# Patient Record
Sex: Male | Born: 1935 | Race: Black or African American | Hispanic: No | Marital: Married | Smoking: Never smoker
Health system: Southern US, Community
[De-identification: ages and names within clinical notes are randomized; demographics above are authoritative.]

## PROBLEM LIST (undated history)

## (undated) DIAGNOSIS — I421 Obstructive hypertrophic cardiomyopathy: Secondary | ICD-10-CM

## (undated) DIAGNOSIS — R011 Cardiac murmur, unspecified: Secondary | ICD-10-CM

## (undated) DIAGNOSIS — I10 Essential (primary) hypertension: Secondary | ICD-10-CM

## (undated) DIAGNOSIS — C801 Malignant (primary) neoplasm, unspecified: Secondary | ICD-10-CM

## (undated) DIAGNOSIS — J449 Chronic obstructive pulmonary disease, unspecified: Secondary | ICD-10-CM

## (undated) DIAGNOSIS — Q396 Congenital diverticulum of esophagus: Secondary | ICD-10-CM

## (undated) DIAGNOSIS — M1990 Unspecified osteoarthritis, unspecified site: Secondary | ICD-10-CM

## (undated) HISTORY — DX: Congenital diverticulum of esophagus: Q39.6

## (undated) HISTORY — PX: ESOPHAGECTOMY: SUR457

---

## 2006-06-15 ENCOUNTER — Emergency Department (HOSPITAL_COMMUNITY): Admission: EM | Admit: 2006-06-15 | Discharge: 2006-06-15 | Payer: Self-pay | Admitting: Emergency Medicine

## 2008-01-25 ENCOUNTER — Emergency Department (HOSPITAL_COMMUNITY): Admission: EM | Admit: 2008-01-25 | Discharge: 2008-01-25 | Payer: Self-pay | Admitting: Emergency Medicine

## 2008-02-05 ENCOUNTER — Encounter: Admission: RE | Admit: 2008-02-05 | Discharge: 2008-02-05 | Payer: Self-pay | Admitting: Orthopedic Surgery

## 2008-07-13 ENCOUNTER — Encounter: Admission: RE | Admit: 2008-07-13 | Discharge: 2008-07-13 | Payer: Self-pay | Admitting: Orthopedic Surgery

## 2008-11-23 ENCOUNTER — Encounter: Admission: RE | Admit: 2008-11-23 | Discharge: 2008-11-23 | Payer: Self-pay | Admitting: Orthopedic Surgery

## 2012-01-03 ENCOUNTER — Other Ambulatory Visit: Payer: Self-pay | Admitting: Orthopedic Surgery

## 2012-01-04 ENCOUNTER — Encounter (HOSPITAL_COMMUNITY)
Admission: RE | Admit: 2012-01-04 | Discharge: 2012-01-04 | Disposition: A | Discharge status: Home / Self Care | Payer: Medicare Other | Source: Ambulatory Visit | Attending: Orthopedic Surgery | Admitting: Orthopedic Surgery

## 2012-01-04 ENCOUNTER — Other Ambulatory Visit: Payer: Self-pay

## 2012-01-04 ENCOUNTER — Encounter (HOSPITAL_COMMUNITY): Payer: Self-pay | Admitting: Vascular Surgery

## 2012-01-04 ENCOUNTER — Encounter (HOSPITAL_COMMUNITY): Payer: Self-pay | Admitting: Pharmacy Technician

## 2012-01-04 ENCOUNTER — Encounter (HOSPITAL_COMMUNITY): Payer: Self-pay

## 2012-01-04 HISTORY — DX: Malignant (primary) neoplasm, unspecified: C80.1

## 2012-01-04 LAB — CBC
MCH: 28.4 pg (ref 26.0–34.0)
MCHC: 33.4 g/dL (ref 30.0–36.0)
MCV: 84.9 fL (ref 78.0–100.0)
Platelets: 192 10*3/uL (ref 150–400)
RDW: 14.5 % (ref 11.5–15.5)
WBC: 3.3 10*3/uL — ABNORMAL LOW (ref 4.0–10.5)

## 2012-01-04 LAB — SURGICAL PCR SCREEN
MRSA, PCR: NEGATIVE
Staphylococcus aureus: NEGATIVE

## 2012-01-04 LAB — COMPREHENSIVE METABOLIC PANEL
AST: 20 U/L (ref 0–37)
Albumin: 3.7 g/dL (ref 3.5–5.2)
Calcium: 9.3 mg/dL (ref 8.4–10.5)
Creatinine, Ser: 0.98 mg/dL (ref 0.50–1.35)
Total Protein: 7.1 g/dL (ref 6.0–8.3)

## 2012-01-04 LAB — URINALYSIS, ROUTINE W REFLEX MICROSCOPIC
Glucose, UA: NEGATIVE mg/dL
Hgb urine dipstick: NEGATIVE
Leukocytes, UA: NEGATIVE
Specific Gravity, Urine: 1.023 (ref 1.005–1.030)

## 2012-01-04 MED ORDER — CHLORHEXIDINE GLUCONATE 4 % EX LIQD: 60.0000 mL | Freq: Once | CUTANEOUS | Status: DC

## 2012-01-04 MED ORDER — CEFAZOLIN SODIUM-DEXTROSE 2-3 GM-% IV SOLR
2.0000 g | INTRAVENOUS | Status: AC
  Administered 2012-01-05: 2 g via INTRAVENOUS
  Filled 2012-01-04: qty 50

## 2012-01-04 NOTE — Pre-Procedure Instructions (Signed)
20 Dan Freeman  01/04/2012   Your procedure is scheduled on:  01/05/12  Report to [facility] at 630 AM.  Call this number if you have problems the morning of surgery: [phone]   Remember:   Do not eat food:After Midnight.  May have clear liquids: up to 4 Hours before arrival.  Clear liquids include soda, tea, black coffee, apple or grape juice, broth.  Take these medicines the morning of surgery with A SIP OF WATER: none   Do not wear jewelry, make-up or nail polish.  Do not wear lotions, powders, or perfumes. You may wear deodorant.  Do not shave 48 hours prior to surgery.  Do not bring valuables to the hospital.  Contacts, dentures or bridgework may not be worn into surgery.  Leave suitcase in the car. After surgery it may be brought to your room.  For patients admitted to the hospital, checkout time is 11:00 AM the day of discharge.   Patients discharged the day of surgery will not be allowed to drive home.  Name and phone number of your driver: family  Special Instructions: CHG Shower Use Special Wash: 1/2 bottle night before surgery and 1/2 bottle morning of surgery.   Please read over the following fact sheets that you were given: Pain Booklet, Coughing and Deep Breathing, MRSA Information and Surgical Site Infection Prevention

## 2012-01-04 NOTE — Consult Note (Signed)
Anesthesia:  Patient is a 78 year old male scheduled for a arthroscopic acromioplasty and possible labral and cuff repair, right shoulder on 01/05/12. His PAT appointment was earlier today, 01/04/12, and I was given his chart to review after 1615.  (No prior records were requested prior to giving me the chart to review.)    His history includes prostate cancer s/p radiation and esophageal diverticulum s/p esophageal resection at [facility]  He denies known history of DM, CAD, CHF, or MI.  His PCP is Dr. Fredia Beets at [facility] [phone]).  His meds include ASA, Ventolin PRN, Ginseng and Ginkgo Biloba.  He denies CP, SOB, edema.  He is a Optician, dispensing, and says he stays fairly active with his work and family which includes 25 grandchildren.  He said he can go up a flight of stairs at a fairly fast pace, can push a lawnmower, etc.    His EKG from today (which is not visible in Epic as of yet) shows NSR, LAD, right BBB, LVH with repolarization abnormality.  (He has anterolateral T wave inversion which may be due to LVH with strain)  His PCP office does not have any EKGs of file.  I have sent a fax to [facility] requesting an old EKG if available.  (Patient is unsure if one has ever been done before.  He denied any prior stress or echo.)  CXR today showed: Mild hyperinflation. No acute infiltrate or pleural effusion. No  pulmonary edema. Stable probable calcified granuloma in the left upper lobe measures 8.7 mm. Probable old fracture deformity of left eighth rib. Question prior thoracotomy or prior trauma with widening of the space between left seventh and eighth rib.   Labs acceptable.  I reviewed his EKG and history with Anesthesiologist Dr. Randa Evens.  Hopefully, we will get a comparison EKG from [facility] before his surgery tomorrow.  Either way, he will be evaluated by his assigned Anesthesiologist tomorrow morning, and a definitive Anesthesia plan will be determined at that time.

## 2012-01-04 NOTE — Pre-Procedure Instructions (Signed)
20 Dan Freeman  01/04/2012   Your procedure is scheduled on:  01/05/12  Report to [facility] at 630 AM.  Call this number if you have problems the morning of surgery: [phone]   Remember:   Do not eat food:After Midnight.  May have clear liquids: up to 4 Hours before arrival.  Clear liquids include soda, tea, black coffee, apple or grape juice, broth.  Take these medicines the morning of surgery with A SIP OF WATER: none   Do not wear jewelry, make-up or nail polish.  Do not wear lotions, powders, or perfumes. You may wear deodorant.  Do not shave 48 hours prior to surgery.  Do not bring valuables to the hospital.  Contacts, dentures or bridgework may not be worn into surgery.  Leave suitcase in the car. After surgery it may be brought to your room.  For patients admitted to the hospital, checkout time is 11:00 AM the day of discharge.   Patients discharged the day of surgery will not be allowed to drive home.  Name and phone number of your driver: wife  Special Instructions: CHG Shower Use Special Wash: 1/2 bottle night before surgery and 1/2 bottle morning of surgery.   Please read over the following fact sheets that you were given: Pain Booklet, Coughing and Deep Breathing, Total Joint Packet, MRSA Information and Surgical Site Infection Prevention

## 2012-01-04 NOTE — Progress Notes (Signed)
Completed not in med rec. 

## 2012-01-05 ENCOUNTER — Ambulatory Visit (HOSPITAL_COMMUNITY): Payer: Medicare Other | Admitting: Vascular Surgery

## 2012-01-05 ENCOUNTER — Encounter (HOSPITAL_COMMUNITY): Payer: Self-pay | Admitting: Vascular Surgery

## 2012-01-05 ENCOUNTER — Ambulatory Visit (HOSPITAL_COMMUNITY)
Admission: RE | Admit: 2012-01-05 | Discharge: 2012-01-06 | Disposition: A | Discharge status: Home / Self Care | Payer: Medicare Other | Source: Ambulatory Visit | Attending: Orthopedic Surgery | Admitting: Orthopedic Surgery

## 2012-01-05 ENCOUNTER — Encounter (HOSPITAL_COMMUNITY)
Admission: RE | Disposition: A | Discharge status: Home / Self Care | Payer: Self-pay | Source: Ambulatory Visit | Attending: Orthopedic Surgery

## 2012-01-05 DIAGNOSIS — M659 Unspecified synovitis and tenosynovitis, unspecified site: Secondary | ICD-10-CM | POA: Insufficient documentation

## 2012-01-05 DIAGNOSIS — H9190 Unspecified hearing loss, unspecified ear: Secondary | ICD-10-CM | POA: Insufficient documentation

## 2012-01-05 DIAGNOSIS — Z01810 Encounter for preprocedural cardiovascular examination: Secondary | ICD-10-CM | POA: Insufficient documentation

## 2012-01-05 DIAGNOSIS — M67919 Unspecified disorder of synovium and tendon, unspecified shoulder: Secondary | ICD-10-CM | POA: Insufficient documentation

## 2012-01-05 DIAGNOSIS — Z01818 Encounter for other preprocedural examination: Secondary | ICD-10-CM | POA: Insufficient documentation

## 2012-01-05 DIAGNOSIS — Z01812 Encounter for preprocedural laboratory examination: Secondary | ICD-10-CM | POA: Insufficient documentation

## 2012-01-05 DIAGNOSIS — M25819 Other specified joint disorders, unspecified shoulder: Secondary | ICD-10-CM | POA: Insufficient documentation

## 2012-01-05 DIAGNOSIS — M719 Bursopathy, unspecified: Secondary | ICD-10-CM | POA: Insufficient documentation

## 2012-01-05 DIAGNOSIS — M75101 Unspecified rotator cuff tear or rupture of right shoulder, not specified as traumatic: Secondary | ICD-10-CM

## 2012-01-05 HISTORY — PX: SHOULDER ARTHROSCOPY: SHX128

## 2012-01-05 SURGERY — ARTHROSCOPY, SHOULDER
Anesthesia: General | Site: Shoulder | Laterality: Right

## 2012-01-05 MED ORDER — ONDANSETRON HCL 4 MG/2ML IJ SOLN
INTRAMUSCULAR | Status: DC | PRN
  Administered 2012-01-05: 4 mg via INTRAVENOUS

## 2012-01-05 MED ORDER — PHENYLEPHRINE HCL 10 MG/ML IJ SOLN
INTRAMUSCULAR | Status: DC | PRN
  Administered 2012-01-05: 100 ug via INTRAVENOUS
  Administered 2012-01-05 (×2): 50 ug via INTRAVENOUS

## 2012-01-05 MED ORDER — ACETAMINOPHEN 325 MG PO TABS
650.0000 mg | ORAL_TABLET | Freq: Four times a day (QID) | ORAL | Status: DC | PRN
  Administered 2012-01-06: 650 mg via ORAL
  Filled 2012-01-05: qty 2

## 2012-01-05 MED ORDER — METOCLOPRAMIDE HCL 10 MG PO TABS: 5.0000 mg | ORAL_TABLET | Freq: Three times a day (TID) | ORAL | Status: DC | PRN

## 2012-01-05 MED ORDER — EPINEPHRINE HCL 1 MG/ML IJ SOLN
INTRAMUSCULAR | Status: DC | PRN
  Administered 2012-01-05: 2 mg

## 2012-01-05 MED ORDER — METOCLOPRAMIDE HCL 5 MG/ML IJ SOLN: 5.0000 mg | Freq: Three times a day (TID) | INTRAMUSCULAR | Status: DC | PRN

## 2012-01-05 MED ORDER — PROPOFOL 10 MG/ML IV EMUL
INTRAVENOUS | Status: DC | PRN
  Administered 2012-01-05: 200 mg via INTRAVENOUS

## 2012-01-05 MED ORDER — LACTATED RINGERS IV SOLN
INTRAVENOUS | Status: DC
  Administered 2012-01-05 (×2): via INTRAVENOUS

## 2012-01-05 MED ORDER — BUPIVACAINE-EPINEPHRINE PF 0.5-1:200000 % IJ SOLN
INTRAMUSCULAR | Status: DC | PRN
  Administered 2012-01-05: 150 mg

## 2012-01-05 MED ORDER — FENTANYL CITRATE 0.05 MG/ML IJ SOLN
INTRAMUSCULAR | Status: AC
  Filled 2012-01-05: qty 2

## 2012-01-05 MED ORDER — ASPIRIN EC 81 MG PO TBEC
81.0000 mg | DELAYED_RELEASE_TABLET | Freq: Every day | ORAL | Status: DC
  Administered 2012-01-05 – 2012-01-06 (×2): 81 mg via ORAL
  Filled 2012-01-05 (×2): qty 1

## 2012-01-05 MED ORDER — FENTANYL CITRATE 0.05 MG/ML IJ SOLN
100.0000 ug | INTRAMUSCULAR | Status: DC | PRN
  Administered 2012-01-05: 100 ug via INTRAVENOUS

## 2012-01-05 MED ORDER — DEXTROSE-NACL 5-0.45 % IV SOLN
INTRAVENOUS | Status: DC
  Administered 2012-01-05: 19:00:00 via INTRAVENOUS

## 2012-01-05 MED ORDER — FENTANYL CITRATE 0.05 MG/ML IJ SOLN
INTRAMUSCULAR | Status: DC | PRN
  Administered 2012-01-05: 50 ug via INTRAVENOUS

## 2012-01-05 MED ORDER — ONDANSETRON HCL 4 MG/2ML IJ SOLN: 4.0000 mg | Freq: Four times a day (QID) | INTRAMUSCULAR | Status: DC | PRN

## 2012-01-05 MED ORDER — ONDANSETRON HCL 4 MG PO TABS: 4.0000 mg | ORAL_TABLET | Freq: Four times a day (QID) | ORAL | Status: DC | PRN

## 2012-01-05 MED ORDER — ACETAMINOPHEN 650 MG RE SUPP: 650.0000 mg | Freq: Four times a day (QID) | RECTAL | Status: DC | PRN

## 2012-01-05 MED ORDER — NEOSTIGMINE METHYLSULFATE 1 MG/ML IJ SOLN
INTRAMUSCULAR | Status: DC | PRN
  Administered 2012-01-05: 3 mg via INTRAVENOUS

## 2012-01-05 MED ORDER — SODIUM CHLORIDE 0.9 % IR SOLN
Status: DC | PRN
  Administered 2012-01-05: 12000 mL

## 2012-01-05 MED ORDER — HYDROMORPHONE HCL PF 1 MG/ML IJ SOLN: 0.2500 mg | INTRAMUSCULAR | Status: DC | PRN

## 2012-01-05 MED ORDER — MIDAZOLAM HCL 2 MG/2ML IJ SOLN
INTRAMUSCULAR | Status: AC
  Filled 2012-01-05: qty 2

## 2012-01-05 MED ORDER — MENTHOL 3 MG MT LOZG: 1.0000 | LOZENGE | OROMUCOSAL | Status: DC | PRN

## 2012-01-05 MED ORDER — CEFAZOLIN SODIUM 1-5 GM-% IV SOLN
1.0000 g | Freq: Four times a day (QID) | INTRAVENOUS | Status: AC
  Administered 2012-01-05 – 2012-01-06 (×3): 1 g via INTRAVENOUS
  Filled 2012-01-05 (×4): qty 50

## 2012-01-05 MED ORDER — OXYCODONE-ACETAMINOPHEN 5-325 MG PO TABS
1.0000 | ORAL_TABLET | ORAL | Status: DC | PRN
  Administered 2012-01-06: 1 via ORAL
  Filled 2012-01-05: qty 1

## 2012-01-05 MED ORDER — DROPERIDOL 2.5 MG/ML IJ SOLN: 0.6250 mg | INTRAMUSCULAR | Status: DC | PRN

## 2012-01-05 MED ORDER — HYDROMORPHONE HCL PF 1 MG/ML IJ SOLN
0.5000 mg | INTRAMUSCULAR | Status: DC | PRN
  Administered 2012-01-06: 1 mg via INTRAVENOUS
  Filled 2012-01-05: qty 1

## 2012-01-05 MED ORDER — PHENYLEPHRINE HCL 10 MG/ML IJ SOLN
20.0000 mg | INTRAVENOUS | Status: DC | PRN
  Administered 2012-01-05: 20 ug/min via INTRAVENOUS

## 2012-01-05 MED ORDER — ALBUTEROL SULFATE HFA 108 (90 BASE) MCG/ACT IN AERS
2.0000 | INHALATION_SPRAY | Freq: Four times a day (QID) | RESPIRATORY_TRACT | Status: DC | PRN
  Administered 2012-01-05: 2 via RESPIRATORY_TRACT
  Filled 2012-01-05: qty 6.7

## 2012-01-05 MED ORDER — GLYCOPYRROLATE 0.2 MG/ML IJ SOLN
INTRAMUSCULAR | Status: DC | PRN
  Administered 2012-01-05: .5 mg via INTRAVENOUS

## 2012-01-05 MED ORDER — MIDAZOLAM HCL 2 MG/2ML IJ SOLN
2.0000 mg | INTRAMUSCULAR | Status: DC | PRN
  Administered 2012-01-05: 2 mg via INTRAVENOUS

## 2012-01-05 MED ORDER — PHENOL 1.4 % MT LIQD: 1.0000 | OROMUCOSAL | Status: DC | PRN

## 2012-01-05 MED ORDER — ROCURONIUM BROMIDE 100 MG/10ML IV SOLN
INTRAVENOUS | Status: DC | PRN
  Administered 2012-01-05: 50 mg via INTRAVENOUS

## 2012-01-05 SURGICAL SUPPLY — 40 items
BLADE CUTTER GATOR 3.5 (BLADE) ×3 IMPLANT
BLADE GREAT WHITE 4.2 (BLADE) ×3 IMPLANT
BLADE SURG 11 STRL SS (BLADE) ×3 IMPLANT
BUR OVAL 4.0 (BURR) ×3 IMPLANT
CLOTH BEACON ORANGE TIMEOUT ST (SAFETY) ×3 IMPLANT
COVER SURGICAL LIGHT HANDLE (MISCELLANEOUS) ×3 IMPLANT
DRAPE STERI 35X30 U-POUCH (DRAPES) ×3 IMPLANT
DRAPE U-SHAPE 47X51 STRL (DRAPES) ×3 IMPLANT
DRSG EMULSION OIL 3X3 NADH (GAUZE/BANDAGES/DRESSINGS) ×3 IMPLANT
DRSG PAD ABDOMINAL 8X10 ST (GAUZE/BANDAGES/DRESSINGS) ×3 IMPLANT
DURAPREP 26ML APPLICATOR (WOUND CARE) ×3 IMPLANT
ELECT MENISCUS 165MM 90D (ELECTRODE) IMPLANT
FILTER STRAW FLUID ASPIR (MISCELLANEOUS) ×3 IMPLANT
GLOVE SS PI 9.0 STRL (GLOVE) ×3 IMPLANT
GOWN PREVENTION PLUS XLARGE (GOWN DISPOSABLE) ×3 IMPLANT
GOWN STRL NON-REIN LRG LVL3 (GOWN DISPOSABLE) ×6 IMPLANT
KIT BASIN OR (CUSTOM PROCEDURE TRAY) ×3 IMPLANT
KIT ROOM TURNOVER OR (KITS) ×3 IMPLANT
MANIFOLD NEPTUNE II (INSTRUMENTS) ×3 IMPLANT
NEEDLE HYPO 21X1.5 SAFETY (NEEDLE) ×3 IMPLANT
NEEDLE HYPO 25GX1X1/2 BEV (NEEDLE) ×3 IMPLANT
NS IRRIG 1000ML POUR BTL (IV SOLUTION) ×3 IMPLANT
PACK SHOULDER (CUSTOM PROCEDURE TRAY) ×3 IMPLANT
PAD ARMBOARD 7.5X6 YLW CONV (MISCELLANEOUS) ×6 IMPLANT
SET ARTHROSCOPY TUBING (MISCELLANEOUS) ×1
SET ARTHROSCOPY TUBING LN (MISCELLANEOUS) ×2 IMPLANT
SLING ARM FOAM STRAP LRG (SOFTGOODS) ×3 IMPLANT
SPONGE GAUZE 4X4 12PLY (GAUZE/BANDAGES/DRESSINGS) ×3 IMPLANT
SPONGE LAP 4X18 X RAY DECT (DISPOSABLE) ×3 IMPLANT
SUT ETHIBOND 2 OS 4 DA (SUTURE) ×3 IMPLANT
SUT ETHILON 4 0 PS 2 18 (SUTURE) ×3 IMPLANT
SUT PROLENE 3 0 PS 2 (SUTURE) IMPLANT
SUT VIC AB 0 CTB1 27 (SUTURE) IMPLANT
SUT VIC AB 2-0 FS1 27 (SUTURE) IMPLANT
SYR 3ML LL SCALE MARK (SYRINGE) ×6 IMPLANT
SYR CONTROL 10ML LL (SYRINGE) ×3 IMPLANT
TAPE CLOTH SURG 6X10 WHT LF (GAUZE/BANDAGES/DRESSINGS) ×3 IMPLANT
TOWEL OR 17X24 6PK STRL BLUE (TOWEL DISPOSABLE) ×3 IMPLANT
TOWEL OR 17X26 10 PK STRL BLUE (TOWEL DISPOSABLE) ×3 IMPLANT
WATER STERILE IRR 1000ML POUR (IV SOLUTION) ×3 IMPLANT

## 2012-01-05 NOTE — Preoperative (Signed)
Beta Blockers   Reason not to administer Beta Blockers:Not Applicable 

## 2012-01-05 NOTE — Op Note (Signed)
NAMEPERLE, GIBBON NO.:  [id number]  MEDICAL RECORD NO.:  [id number]  LOCATION:  [facility]                         FACILITY:  [facility]  PHYSICIAN:  Myrtie Neither, MD      DATE OF BIRTH:  1930-12-25  DATE OF PROCEDURE:  01/05/2012 DATE OF DISCHARGE:                              OPERATIVE REPORT   PREOPERATIVE DIAGNOSES:  Impingement syndrome, right shoulder; chronic rotator cuff tear, right shoulder; labral tear, right shoulder; and synovitis, right shoulder.  POSTOPERATIVE DIAGNOSES:  Impingement syndrome, right shoulder; chronic rotator cuff tear, right shoulder; labral tear, right shoulder; and synovitis, right shoulder.  OPERATION:  Arthroscopic synovectomy, arthroscopic acromioplasty, and debridement of labral tear.  ANESTHESIA:  General.  PROCEDURE TECHNIQUE:  The patient was taken to the operating room. After given adequate preop medications, given general anesthesia, and intubated, right shoulder was prepped with DuraPrep and draped in sterile manner.  The patient was placed in a barber chair position. Incision was made posteriorly.  Swisher rod was placed from posterior to anterior.  Inflow broad incision was then made anteriorly.  Separate lateral incision was made for the shaver.  Inspection revealed chronic rotator cuff tear non-repairable with tremendous hypertrophic overgrowth of the synovium, osteophyte anteriorly and laterally above the acromion. Inspection of the glenoid revealed degenerative tear above the superior and anterior lip of the labrum, but was still well adherent to the glenoid.  Complete synovectomy was done followed by acromioplasty and debridement of the labrum was done.  Separate osteophyte was identified and resected.  Humeral head showed degenerative changes, but was well preserved.  The glenoid itself was demonstrated the degenerative changes as well.  After adequate acromioplasty and synovectomy, wound closure was then done  with 4-0 nylon.  The patient had previous block, so local was not necessary.  Immobilizing sling was applied.  The patient tolerated the procedure quite well and went to recovery room in stable and satisfactory condition.     Myrtie Neither, MD     AC/MEDQ  D:  01/05/2012  T:  01/05/2012  Job:  [id number]

## 2012-01-05 NOTE — H&P (Signed)
Dan Freeman, EVITTS NO.:  [id number]  MEDICAL RECORD NO.:  [id number]  LOCATION:  [facility]                         FACILITY:  [facility]  PHYSICIAN:  Myrtie Neither, MD      DATE OF BIRTH:  [birth year]  DATE OF ADMISSION:  01/05/2012 DATE OF DISCHARGE:                             HISTORY & PHYSICAL   CHIEF COMPLAINT:  Painful, weak in his right shoulder.  HISTORY OF PRESENT ILLNESS:  This is a 78 year old black male who has been followed in the office for chronic rotator cuff tear and impingement syndrome of the right shoulder, which had been treated with anti-inflammatories and shoulder exercises.  The patient had noted progressive worsening with difficulty reaching at and above chest level, catching and feeling locking of the right shoulder over the past few months.  The patient states the conditions are getting progressively worse and more disabling.  PAST MEDICAL HISTORY:  Prostate cancer treatment and esophageal dilatation for the stricture.  No history of high blood pressure or diabetes mellitus.  ALLERGIES:  None known.  MEDICATIONS:  Lodine 400 XL b.i.d.  REVIEW OF SYSTEMS:  No cardiac, respiratory.  No urinary or bowel symptoms.  Basically that in the history of present illness.  Some episodic low back and joint pain.  FAMILY HISTORY:  Noncontributory.  No history of high blood pressure, diabetes.  SOCIAL HISTORY:  The patient denies use of alcohol, tobacco, or illegal drugs.  PHYSICAL EXAMINATION:  VITAL SIGNS:  Temperature 98.2, pulse 72, respirations 18, blood pressure 137/86, O2 saturation 97%, height 5 feet 8 inches, weight 164 pounds. HEENT:  Head normocephalic.  Eyes:  Conjunctivae and sclerae are clear. NECK:  Supple. CHEST:  Clear. CARDIAC:  S1, S2 regular. EXTREMITIES:  Right shoulder tender anterolaterally with subacromial crepitus.  The patient has both pain as well as weakness on active abduction to chest level.  The patient  experiences catching weakness in the right shoulder between 70 and 90 degree position.  Pain on external rotation with palpable and audible click.  Good grip and pinch, intrinsics intact.  IMAGING:  X-ray revealed decreased loss of subacromial space.  Some degenerative joint changes.  Osteophytes about the acromion.  IMPRESSION: 1. Impingement syndrome, right shoulder. 2. Chronic rotator cuff tear, right shoulder. 3. Labral tear, right shoulder.  PLAN:  Arthroscopic acromioplasty and synovectomy.  Possible rotator cough and labral tear.     Myrtie Neither, MD     AC/MEDQ  D:  01/05/2012  T:  01/05/2012  Job:  [id number]

## 2012-01-05 NOTE — H&P (Signed)
Dan Freeman is an 78 y.o. male.   Chief Complaint:RIGHT SHOULDER PAIN AND WEAKNESS HPI: RIGHT SHOULDER PAIN AND WEAKNESS GETTING PROGRESSIVELY GETTING WORSE OVER THE FEW MONTHS.DIFFICULTY REACHING AND LIFTING ABOVE CHEST LEVEL.  Past Medical History  Diagnosis Date  . Cancer      prostrate     radiation  . Esophageal diverticulum     s/p esophageal resection    Past Surgical History  Procedure Date  . Esophagectomy     No family history on file. Social History:  reports that he has never smoked. He does not have any smokeless tobacco history on file. He reports that he does not drink alcohol or use illicit drugs.  Allergies: No Known Allergies  Medications Prior to Admission  Medication Dose Route Frequency Provider Last Rate Last Dose  . ceFAZolin (ANCEF) IVPB 2 g/50 mL premix  2 g Intravenous 60 min Pre-Op Kennieth Rad, MD       No current outpatient prescriptions on file as of 01/05/2012.    Results for orders placed during the hospital encounter of 01/04/12 (from the past 48 hour(s))  URINALYSIS, ROUTINE W REFLEX MICROSCOPIC     Status: Normal   Collection Time   01/04/12  9:39 AM      Component Value Range Comment   Color, Urine YELLOW  YELLOW     APPearance CLEAR  CLEAR     Specific Gravity, Urine 1.023  1.005 - 1.030     pH 5.5  5.0 - 8.0     Glucose, UA NEGATIVE  NEGATIVE (mg/dL)    Hgb urine dipstick NEGATIVE  NEGATIVE     Bilirubin Urine NEGATIVE  NEGATIVE     Ketones, ur NEGATIVE  NEGATIVE (mg/dL)    Protein, ur NEGATIVE  NEGATIVE (mg/dL)    Urobilinogen, UA 0.2  0.0 - 1.0 (mg/dL)    Nitrite NEGATIVE  NEGATIVE     Leukocytes, UA NEGATIVE  NEGATIVE  MICROSCOPIC NOT DONE ON URINES WITH NEGATIVE PROTEIN, BLOOD, LEUKOCYTES, NITRITE, OR GLUCOSE <1000 mg/dL.  SURGICAL PCR SCREEN     Status: Normal   Collection Time   01/04/12  9:39 AM      Component Value Range Comment   MRSA, PCR NEGATIVE  NEGATIVE     Staphylococcus aureus NEGATIVE  NEGATIVE    CBC      Status: Abnormal   Collection Time   01/04/12  9:40 AM      Component Value Range Comment   WBC 3.3 (*) 4.0 - 10.5 (K/uL)    RBC 4.65  4.22 - 5.81 (MIL/uL)    Hemoglobin 13.2  13.0 - 17.0 (g/dL)    HCT 65.7  84.6 - 96.2 (%)    MCV 84.9  78.0 - 100.0 (fL)    MCH 28.4  26.0 - 34.0 (pg)    MCHC 33.4  30.0 - 36.0 (g/dL)    RDW 95.2  84.1 - 32.4 (%)    Platelets 192  150 - 400 (K/uL)   COMPREHENSIVE METABOLIC PANEL     Status: Abnormal   Collection Time   01/04/12  9:40 AM      Component Value Range Comment   Sodium 142  135 - 145 (mEq/L)    Potassium 4.6  3.5 - 5.1 (mEq/L)    Chloride 109  96 - 112 (mEq/L)    CO2 27  19 - 32 (mEq/L)    Glucose, Bld 104 (*) 70 - 99 (mg/dL)    BUN 19  6 - 23 (mg/dL)    Creatinine, Ser 1.61  0.50 - 1.35 (mg/dL)    Calcium 9.3  8.4 - 10.5 (mg/dL)    Total Protein 7.1  6.0 - 8.3 (g/dL)    Albumin 3.7  3.5 - 5.2 (g/dL)    AST 20  0 - 37 (U/L)    ALT 17  0 - 53 (U/L)    Alkaline Phosphatase 65  39 - 117 (U/L)    Total Bilirubin 0.4  0.3 - 1.2 (mg/dL)    GFR calc non Af Amer 77 (*) >90 (mL/min)    GFR calc Af Amer 89 (*) >90 (mL/min)    Dg Chest 2 View  01/04/2012  *RADIOLOGY REPORT*  Clinical Data: Preop  CHEST - 2 VIEW  Comparison: Left shoulder 02/05/2008  Findings: Cardiomediastinal silhouette is unremarkable.  Mild hyperinflation.  No acute infiltrate or pleural effusion.  No pulmonary edema. Stable probable calcified granuloma in the left upper lobe measures 8.7 mm.  Probable old fracture deformity of left eighth rib.  Question prior thoracotomy or prior trauma with widening of the space between left seventh and eighth rib.  IMPRESSION: Mild hyperinflation.  No acute infiltrate or pleural effusion.  No pulmonary edema. Stable probable calcified granuloma in the left upper lobe measures 8.7 mm.  Probable old fracture deformity of left eighth rib.  Question prior thoracotomy or prior trauma with widening of the space between left seventh and eighth rib.   Original Report Authenticated By: Natasha Mead, M.D.    Review of Systems  Constitutional: Negative.  Negative for fever, chills and weight loss.  HENT: Positive for hearing loss. Negative for ear pain, congestion, sore throat and tinnitus.   Respiratory: Negative for cough, shortness of breath and wheezing.   Cardiovascular: Negative for chest pain, palpitations and leg swelling.  Gastrointestinal: Negative for abdominal pain, constipation and blood in stool.  Genitourinary: Negative for dysuria, urgency, frequency and hematuria.  Musculoskeletal: Positive for back pain and joint pain. Negative for falls.  Skin: Negative for itching and rash.  Neurological: Negative.  Negative for headaches.  Endo/Heme/Allergies: Negative.   Psychiatric/Behavioral: Negative.     Blood pressure 137/86, pulse 72, temperature 98.2 F (36.8 C), temperature source Oral, resp. rate 18, SpO2 97.00%. Physical Exam   Assessment/Plan IMPINGEMENT SYNDROME,  CHRONIC ROTATOR CUFF TEAR, AND LABRIAL TEAR RIGHT SHOULDER /PLAN :ARTHROSCOPIC ACROMIOPLASTY, SYNOVECTOMY, POSSIBLE CUFF AND LABRIAL REPAIR RIGHT SHOULDER.  Kennieth Rad 01/05/2012, 8:04 AM

## 2012-01-05 NOTE — Transfer of Care (Signed)
Immediate Anesthesia Transfer of Care Note  Patient: Dan Freeman  Procedure(s) Performed: Procedure(s) (LRB): ARTHROSCOPY SHOULDER (Right)  Patient Location: PACU  Anesthesia Type: GA combined with regional for post-op pain  Level of Consciousness: awake  Airway & Oxygen Therapy: Patient Spontanous Breathing and Patient connected to nasal cannula oxygen  Post-op Assessment: Report given to PACU RN, Post -op Vital signs reviewed and stable and Patient moving all extremities  Post vital signs: Reviewed and stable  Complications: No apparent anesthesia complications

## 2012-01-05 NOTE — Anesthesia Procedure Notes (Signed)
Anesthesia Regional Block:  Interscalene brachial plexus block  Pre-Anesthetic Checklist: ,, timeout performed, Correct Patient, Correct Site, Correct Laterality, Correct Procedure,, site marked, risks and benefits discussed, Surgical consent,  Pre-op evaluation,  At surgeon's request and post-op pain management  Laterality: Right  Prep: chloraprep       Needles:  Injection technique: Single-shot  Needle Type: Echogenic Stimulator Needle     Needle Length: 5cm 5 cm Needle Gauge: 22 and 22 G    Additional Needles:  Procedures: ultrasound guided and nerve stimulator Interscalene brachial plexus block  Nerve Stimulator or Paresthesia:  Response: bicep contraction, 0.45 mA,   Additional Responses:   Narrative:  Start time: 01/05/2012 8:21 AM End time: 01/05/2012 8:32 AM Injection made incrementally with aspirations every 5 mL.  Performed by: Personally  Anesthesiologist: J. Adonis Huguenin, MD  Additional Notes: Functioning IV was confirmed and monitors applied.  A 50mm 22ga echogenic arrow stimulator was used. Sterile prep and drape,hand hygiene and sterile gloves were used.Ultrasound guidance: relevent anatomy identified, needle position confirmed, local anesthetic spread visualized around nerve(s)., vascular puncture avoided.  Image printed for medical record.  Negative aspiration and negative test dose prior to incremental administration of local anesthetic. The patient tolerated the procedure well.  Interscalene brachial plexus block

## 2012-01-05 NOTE — Anesthesia Postprocedure Evaluation (Signed)
Anesthesia Post Note  Patient: Dan Freeman  Procedure(s) Performed: Procedure(s) (LRB): ARTHROSCOPY SHOULDER (Right)  Anesthesia type: general  Patient location: PACU  Post pain: Pain level controlled  Post assessment: Patient's Cardiovascular Status Stable  Last Vitals:  Filed Vitals:   01/05/12 1246  BP:   Pulse:   Temp: 36.1 C  Resp:     Post vital signs: Reviewed and stable  Level of consciousness: sedated  Complications: No apparent anesthesia complications

## 2012-01-05 NOTE — Anesthesia Preprocedure Evaluation (Signed)
Anesthesia Evaluation  Patient identified by MRN, date of birth, ID band Patient awake    Reviewed: Allergy & Precautions, H&P , NPO status , Patient's Chart, lab work & pertinent test results  History of Anesthesia Complications Negative for: history of anesthetic complications  Airway Mallampati: I TM Distance: >3 FB     Dental  (+) Partial Upper, Partial Lower and Dental Advisory Given   Pulmonary COPD COPD inhaler, former smoker breath sounds clear to auscultation  Pulmonary exam normal       Cardiovascular negative cardio ROS  Rhythm:Regular Rate:Normal     Neuro/Psych negative neurological ROS     GI/Hepatic negative GI ROS, Neg liver ROS,   Endo/Other  negative endocrine ROS  Renal/GU negative Renal ROS     Musculoskeletal   Abdominal   Peds  Hematology   Anesthesia Other Findings   Reproductive/Obstetrics                           Anesthesia Physical Anesthesia Plan  ASA: III  Anesthesia Plan: General   Post-op Pain Management:    Induction: Intravenous  Airway Management Planned: Oral ETT  Additional Equipment:   Intra-op Plan:   Post-operative Plan:   Informed Consent: I have reviewed the patients History and Physical, chart, labs and discussed the procedure including the risks, benefits and alternatives for the proposed anesthesia with the patient or authorized representative who has indicated his/her understanding and acceptance.   Dental advisory given  Plan Discussed with: CRNA, Anesthesiologist and Surgeon  Anesthesia Plan Comments:         Anesthesia [place] Evaluation

## 2012-01-05 NOTE — Brief Op Note (Signed)
01/05/2012  10:17 AM  PATIENT:  Dan Freeman  78 y.o. male  PRE-OPERATIVE DIAGNOSIS:  Impingement Syndrome Shoulder Cuff Tear, Labrial Tear Right Side  POST-OPERATIVE DIAGNOSIS:  Impingement Syndrome Shoulder Cuff Tear, Labrial Tear Right Side  PROCEDURE:  Procedure(s) (LRB): ARTHROSCOPY SHOULDER (Right) ARTHROSCOPIC LABRAL REPAIR (Right)  SURGEON:  Surgeon(s) and Role:    * Kennieth Rad, MD - Primary  PHYSICIAN ASSISTANT:   ASSISTANTS: none   ANESTHESIA:   general  EBL:     BLOOD ADMINISTERED:none  DRAINS: none   LOCAL MEDICATIONS USED:  NONE  SPECIMEN:  No Specimen  DISPOSITION OF SPECIMEN:  N/A  COUNTS:  YES  TOURNIQUET:  * No tourniquets in log *  DICTATION: .Other Dictation: Dictation Number report #[id number]  PLAN OF CARE: Admit for overnight observation  PATIENT DISPOSITION:  PACU - hemodynamically stable.   Delay start of Pharmacological VTE agent (>24hrs) due to surgical blood loss or risk of bleeding: not applicable

## 2012-01-05 NOTE — Progress Notes (Signed)
Foley catheter discontinued at 1725 and patient had an large amount of incontinent urine episode at 1830, bladder scanned for 69ml.  Will continue to monitor.

## 2012-01-05 NOTE — Progress Notes (Signed)
Pt transferred to 5008 from PACU via bed. Pt A&O. Family at bedside. Surgical dressing to R shoulder clean, dry, and intact. Sling to R arm on. 2L O2 via [place]. 18G PIV L hand with D5 1/2 @75 . Assessment completed and placed call bell in reach. Pt denied pain or concerns. Oriented pt to room.

## 2012-01-06 MED ORDER — OXYCODONE-ACETAMINOPHEN 5-325 MG PO TABS: 1.0000 | ORAL_TABLET | ORAL | Status: AC | PRN

## 2012-01-06 MED ORDER — ETODOLAC ER 600 MG PO TB24: 600.0000 mg | ORAL_TABLET | Freq: Every day | ORAL | Status: AC

## 2012-01-06 NOTE — Progress Notes (Signed)
Occupational Therapy Note  Shoulder evaluation completed and filed in shadow chart.  All education completed.  Pt will have necessary level of assist upon d/c home. No DME needs.  Progress rehab of shoulder as ordered by MD post follow-up appointment.  No further acute OT needed. Signing off. Thanks!  01/06/2012 Cipriano Mile OTR/L Pager [phone] Office [phone]

## 2012-01-06 NOTE — Discharge Summary (Signed)
NAMEDARRIEN, BELTER NO.:  [id number]  MEDICAL RECORD NO.:  [id number]  LOCATION:  5008                         FACILITY:  [facility]  PHYSICIAN:  Myrtie Neither, MD      DATE OF BIRTH:  1931/07/27  DATE OF ADMISSION:  01/05/2012 DATE OF DISCHARGE:  01/06/2012                              DISCHARGE SUMMARY   ADMITTING DIAGNOSIS:  Chronic rotator cuff tear, impingement syndrome, labral tear, right shoulder.  DISCHARGE DIAGNOSIS:  Chronic rotator cuff tear, impingement syndrome, labral tear, right shoulder.  COMPLICATIONS:  None.  INFECTIONS:  None.  OPERATIONS:  Arthroscopic acromioplasty, synovectomy, and labral tear debridement.  PERTINENT HISTORY:  The patient has been followed in the office for chronic impingement syndrome involving the right rotator cuff with progressive weakening and difficulty lifting and reaching the head and above and the chest level over the past few months.  The patient is noted to have increased loss of function.  Pertinent physical exam of the right shoulder, tender anteriorly and laterally, subacromial crepitus, limited range of motion with pain and increased on resisted abduction and attempted active abduction at 75- degree position, palpable and audible click anteriorly about the right shoulder.  HOSPITAL COURSE:  The patient underwent preop laboratory, CBC, EKG, chest x-ray, CMET, UA.  The patient's chest x-ray, EKG, and the patient's labs were found to be stable enough to undergo surgery.  The patient was sent for arthroscopic surgery and tolerated the procedure quite well.  POSTOP CARE:  A 23-hour observation for pain control.  The patient remained stable, afebrile.  Presently, he is able to grip well. Dressing is dry.  The patient is stable enough to be discharged. Continue ice packs for another 24 hours twice a day and keep dressing on.  Return to our office next Wednesday.  Percocet 1-2 q.4 hours p.r.n. for pain, and  Lodine 600 mg 1 daily.  The patient is being discharged in stable and satisfactory condition.     Myrtie Neither, MD     AC/MEDQ  D:  01/06/2012  T:  01/06/2012  Job:  [id number]

## 2012-01-08 ENCOUNTER — Encounter (HOSPITAL_COMMUNITY): Payer: Self-pay | Admitting: Orthopedic Surgery

## 2012-01-24 ENCOUNTER — Ambulatory Visit: Payer: Medicare Other | Attending: Orthopedic Surgery

## 2012-01-24 DIAGNOSIS — IMO0001 Reserved for inherently not codable concepts without codable children: Secondary | ICD-10-CM | POA: Insufficient documentation

## 2012-01-24 DIAGNOSIS — M25519 Pain in unspecified shoulder: Secondary | ICD-10-CM | POA: Insufficient documentation

## 2012-01-24 DIAGNOSIS — M25619 Stiffness of unspecified shoulder, not elsewhere classified: Secondary | ICD-10-CM | POA: Insufficient documentation

## 2012-01-24 DIAGNOSIS — M6281 Muscle weakness (generalized): Secondary | ICD-10-CM | POA: Insufficient documentation

## 2012-01-24 DIAGNOSIS — R5381 Other malaise: Secondary | ICD-10-CM | POA: Insufficient documentation

## 2012-01-25 ENCOUNTER — Encounter (HOSPITAL_COMMUNITY): Admission: RE | Payer: Self-pay | Source: Ambulatory Visit

## 2012-01-25 SURGERY — SHOULDER ARTHROSCOPY WITH SUBACROMIAL DECOMPRESSION
Anesthesia: General | Laterality: Right

## 2012-01-26 ENCOUNTER — Ambulatory Visit (HOSPITAL_COMMUNITY): Admission: RE | Admit: 2012-01-26 | Payer: Medicare Other | Source: Ambulatory Visit | Admitting: Orthopedic Surgery

## 2012-01-29 ENCOUNTER — Ambulatory Visit: Payer: Medicare Other

## 2012-01-31 ENCOUNTER — Ambulatory Visit: Payer: Medicare Other

## 2012-02-05 ENCOUNTER — Ambulatory Visit: Payer: Medicare Other

## 2012-02-12 ENCOUNTER — Ambulatory Visit: Payer: Medicare Other

## 2012-02-14 ENCOUNTER — Ambulatory Visit: Payer: Medicare Other | Attending: Orthopedic Surgery

## 2012-02-14 DIAGNOSIS — IMO0001 Reserved for inherently not codable concepts without codable children: Secondary | ICD-10-CM | POA: Insufficient documentation

## 2012-02-14 DIAGNOSIS — M6281 Muscle weakness (generalized): Secondary | ICD-10-CM | POA: Insufficient documentation

## 2012-02-14 DIAGNOSIS — R5381 Other malaise: Secondary | ICD-10-CM | POA: Insufficient documentation

## 2012-02-14 DIAGNOSIS — M25519 Pain in unspecified shoulder: Secondary | ICD-10-CM | POA: Insufficient documentation

## 2012-02-14 DIAGNOSIS — M25619 Stiffness of unspecified shoulder, not elsewhere classified: Secondary | ICD-10-CM | POA: Insufficient documentation

## 2012-02-19 ENCOUNTER — Ambulatory Visit: Payer: Medicare Other

## 2012-02-21 ENCOUNTER — Ambulatory Visit: Payer: Medicare Other

## 2012-02-27 ENCOUNTER — Ambulatory Visit: Payer: Medicare Other | Admitting: Physical Therapy

## 2012-02-28 ENCOUNTER — Encounter: Payer: Medicare Other | Admitting: Physical Therapy

## 2012-03-06 ENCOUNTER — Ambulatory Visit: Payer: Medicare Other | Admitting: Physical Therapy

## 2012-03-08 ENCOUNTER — Ambulatory Visit: Payer: Medicare Other

## 2012-03-13 ENCOUNTER — Ambulatory Visit: Payer: Medicare Other

## 2012-03-15 ENCOUNTER — Ambulatory Visit: Payer: Medicare Other | Admitting: Physical Therapy

## 2012-03-18 ENCOUNTER — Ambulatory Visit: Payer: Medicare Other | Attending: Orthopedic Surgery | Admitting: Physical Therapy

## 2012-03-18 DIAGNOSIS — IMO0001 Reserved for inherently not codable concepts without codable children: Secondary | ICD-10-CM | POA: Insufficient documentation

## 2012-03-18 DIAGNOSIS — M25519 Pain in unspecified shoulder: Secondary | ICD-10-CM | POA: Insufficient documentation

## 2012-03-18 DIAGNOSIS — M6281 Muscle weakness (generalized): Secondary | ICD-10-CM | POA: Insufficient documentation

## 2012-03-18 DIAGNOSIS — R5381 Other malaise: Secondary | ICD-10-CM | POA: Insufficient documentation

## 2012-03-18 DIAGNOSIS — M25619 Stiffness of unspecified shoulder, not elsewhere classified: Secondary | ICD-10-CM | POA: Insufficient documentation

## 2013-05-01 ENCOUNTER — Ambulatory Visit
Admission: RE | Admit: 2013-05-01 | Discharge: 2013-05-01 | Disposition: A | Discharge status: Home / Self Care | Payer: Medicare Other | Source: Ambulatory Visit | Attending: Orthopedic Surgery | Admitting: Orthopedic Surgery

## 2013-05-01 ENCOUNTER — Other Ambulatory Visit: Payer: Self-pay | Admitting: Orthopedic Surgery

## 2013-05-01 DIAGNOSIS — M5412 Radiculopathy, cervical region: Secondary | ICD-10-CM

## 2013-05-14 ENCOUNTER — Other Ambulatory Visit: Payer: Self-pay | Admitting: Orthopedic Surgery

## 2013-05-14 DIAGNOSIS — M5412 Radiculopathy, cervical region: Secondary | ICD-10-CM

## 2013-05-19 ENCOUNTER — Other Ambulatory Visit: Payer: Self-pay | Admitting: Orthopedic Surgery

## 2013-05-19 DIAGNOSIS — Z139 Encounter for screening, unspecified: Secondary | ICD-10-CM

## 2013-05-21 ENCOUNTER — Ambulatory Visit
Admission: RE | Admit: 2013-05-21 | Discharge: 2013-05-21 | Disposition: A | Discharge status: Home / Self Care | Payer: Medicare Other | Source: Ambulatory Visit | Attending: Orthopedic Surgery | Admitting: Orthopedic Surgery

## 2013-05-21 DIAGNOSIS — M5412 Radiculopathy, cervical region: Secondary | ICD-10-CM

## 2013-05-21 DIAGNOSIS — Z139 Encounter for screening, unspecified: Secondary | ICD-10-CM

## 2014-05-18 ENCOUNTER — Ambulatory Visit
Admission: RE | Admit: 2014-05-18 | Discharge: 2014-05-18 | Disposition: A | Discharge status: Home / Self Care | Payer: Medicare Other | Source: Ambulatory Visit | Attending: Orthopedic Surgery | Admitting: Orthopedic Surgery

## 2014-05-18 ENCOUNTER — Other Ambulatory Visit: Payer: Self-pay | Admitting: Orthopedic Surgery

## 2014-05-18 DIAGNOSIS — M5489 Other dorsalgia: Secondary | ICD-10-CM

## 2014-06-04 ENCOUNTER — Other Ambulatory Visit: Payer: Self-pay | Admitting: Orthopedic Surgery

## 2014-06-04 DIAGNOSIS — M5136 Other intervertebral disc degeneration, lumbar region: Secondary | ICD-10-CM

## 2014-06-08 ENCOUNTER — Ambulatory Visit
Admission: RE | Admit: 2014-06-08 | Discharge: 2014-06-08 | Disposition: A | Discharge status: Home / Self Care | Payer: Medicare Other | Source: Ambulatory Visit | Attending: Orthopedic Surgery | Admitting: Orthopedic Surgery

## 2014-06-08 DIAGNOSIS — M5136 Other intervertebral disc degeneration, lumbar region: Secondary | ICD-10-CM

## 2014-08-14 ENCOUNTER — Other Ambulatory Visit: Payer: Self-pay | Admitting: Orthopedic Surgery

## 2014-08-14 ENCOUNTER — Ambulatory Visit
Admission: RE | Admit: 2014-08-14 | Discharge: 2014-08-14 | Disposition: A | Discharge status: Home / Self Care | Payer: Medicare Other | Source: Ambulatory Visit | Attending: Orthopedic Surgery | Admitting: Orthopedic Surgery

## 2014-08-14 DIAGNOSIS — S161XXD Strain of muscle, fascia and tendon at neck level, subsequent encounter: Secondary | ICD-10-CM

## 2014-08-14 DIAGNOSIS — M25511 Pain in right shoulder: Secondary | ICD-10-CM

## 2014-10-08 ENCOUNTER — Emergency Department (HOSPITAL_COMMUNITY)
Admission: EM | Admit: 2014-10-08 | Discharge: 2014-10-08 | Disposition: A | Discharge status: Home / Self Care | Payer: Medicare Other | Attending: Emergency Medicine | Admitting: Emergency Medicine

## 2014-10-08 ENCOUNTER — Encounter (HOSPITAL_COMMUNITY): Payer: Self-pay

## 2014-10-08 DIAGNOSIS — Z7982 Long term (current) use of aspirin: Secondary | ICD-10-CM | POA: Insufficient documentation

## 2014-10-08 DIAGNOSIS — Z8546 Personal history of malignant neoplasm of prostate: Secondary | ICD-10-CM | POA: Diagnosis not present

## 2014-10-08 DIAGNOSIS — Z79899 Other long term (current) drug therapy: Secondary | ICD-10-CM | POA: Diagnosis not present

## 2014-10-08 DIAGNOSIS — S6010XA Contusion of unspecified finger with damage to nail, initial encounter: Secondary | ICD-10-CM

## 2014-10-08 DIAGNOSIS — Z923 Personal history of irradiation: Secondary | ICD-10-CM | POA: Insufficient documentation

## 2014-10-08 DIAGNOSIS — Y9289 Other specified places as the place of occurrence of the external cause: Secondary | ICD-10-CM | POA: Diagnosis not present

## 2014-10-08 DIAGNOSIS — Y998 Other external cause status: Secondary | ICD-10-CM | POA: Insufficient documentation

## 2014-10-08 DIAGNOSIS — S61312A Laceration without foreign body of right middle finger with damage to nail, initial encounter: Secondary | ICD-10-CM | POA: Diagnosis not present

## 2014-10-08 DIAGNOSIS — W208XXA Other cause of strike by thrown, projected or falling object, initial encounter: Secondary | ICD-10-CM | POA: Insufficient documentation

## 2014-10-08 DIAGNOSIS — Y9389 Activity, other specified: Secondary | ICD-10-CM | POA: Insufficient documentation

## 2014-10-08 DIAGNOSIS — S60131A Contusion of right middle finger with damage to nail, initial encounter: Secondary | ICD-10-CM | POA: Diagnosis not present

## 2014-10-08 DIAGNOSIS — S6991XA Unspecified injury of right wrist, hand and finger(s), initial encounter: Secondary | ICD-10-CM | POA: Diagnosis present

## 2014-10-08 DIAGNOSIS — IMO0002 Reserved for concepts with insufficient information to code with codable children: Secondary | ICD-10-CM

## 2014-10-08 MED ORDER — CEPHALEXIN 250 MG PO CAPS: 250.0000 mg | ORAL_CAPSULE | Freq: Three times a day (TID) | ORAL | Status: DC

## 2014-10-08 MED ORDER — BUPIVACAINE HCL (PF) 0.5 % IJ SOLN
50.0000 mL | Freq: Once | INTRAMUSCULAR | Status: AC
  Administered 2014-10-08: 30 mL
  Filled 2014-10-08: qty 60

## 2014-10-08 MED ORDER — CEPHALEXIN 250 MG PO CAPS
250.0000 mg | ORAL_CAPSULE | Freq: Once | ORAL | Status: AC
  Administered 2014-10-08: 250 mg via ORAL
  Filled 2014-10-08: qty 1

## 2014-10-08 MED ORDER — LIDOCAINE HCL (PF) 1 % IJ SOLN
5.0000 mL | Freq: Once | INTRAMUSCULAR | Status: AC
  Administered 2014-10-08: 5 mL
  Filled 2014-10-08: qty 5

## 2014-10-08 NOTE — Discharge Instructions (Signed)
please take the antibiotics as directed until all tablets completed  Have your PCP check your finger for any signs of infection in 2-3 days  The sutures should be removed in 10 days

## 2014-10-08 NOTE — ED Notes (Signed)
Pt presents with c/o right finger injury that occurred last night. Pt reports that he dropped a heavy piece of wood onto his right middle finger. Pt reports his finger "busted open". Pt reports his finger has been bleeding since then and he has changed the dressing multiple times.

## 2014-10-08 NOTE — ED Provider Notes (Signed)
CSN: [id number]     Arrival date & time 10/08/14  1929 History  This chart was scribed for Dan Balding, NP, working with Dan Muskrat, MD, by Peyton Bottoms ED Scribe. This patient was seen in room [place] and the patient's care was started at 8:06 PM  Chief Complaint  Patient presents with  . Finger Injury   The history is provided by the patient. No language interpreter was used.   HPI Comments: Dan Freeman is a 80 y.o. male who presents to the Emergency Department complaining of injury to right middle finger that occurred last night. Patient states that he dropped a heavy piece of wood onto his right middle finger. Per wife, bleeding is partially controlled.   Past Medical History  Diagnosis Date  . Cancer      prostrate     radiation  . Esophageal diverticulum     s/p esophageal resection   Past Surgical History  Procedure Laterality Date  . Esophagectomy    . Shoulder arthroscopy  01/05/2012    Procedure: ARTHROSCOPY SHOULDER;  Surgeon: Sharmon Revere, MD;  Location: [facility];  Service: Orthopedics;  Laterality: Right;  acromioplasty   No family history on file. History  Substance Use Topics  . Smoking status: Never Smoker   . Smokeless tobacco: Not on file  . Alcohol Use: No   Review of Systems  Constitutional: Negative for fever and chills.  HENT: Negative for rhinorrhea and sore throat.   Respiratory: Negative for cough and shortness of breath.   Cardiovascular: Negative for chest pain.  Gastrointestinal: Negative for nausea, vomiting and diarrhea.  Genitourinary: Negative for dysuria.  Musculoskeletal: Negative for back pain.  Skin: Positive for wound (right middle finger).  Neurological: Negative for headaches.  Psychiatric/Behavioral: Negative for confusion.  All other systems reviewed and are negative.  Allergies  Review of patient's allergies indicates no known allergies.  Home Medications   Prior to Admission medications   Medication Sig Start  Date End Date Taking? Authorizing Provider  albuterol (PROVENTIL HFA;VENTOLIN HFA) 108 (90 BASE) MCG/ACT inhaler Inhale 2 puffs into the lungs every 6 (six) hours as needed. For shortness of breath    Historical Provider, MD  aspirin EC 81 MG tablet Take 81 mg by mouth daily.    Historical Provider, MD  cephALEXin (KEFLEX) 250 MG capsule Take 1 capsule (250 mg total) by mouth 3 (three) times daily. 10/08/14   Dan Balding, NP  GINKGO BILOBA EXTRACT PO Take 1 capsule by mouth 2 (two) times daily.    Historical Provider, MD  Misc Natural Products (GINSENG COMPLEX PO) Take 1 capsule by mouth 2 (two) times daily.    Historical Provider, MD   Triage Vitals: BP 144/79 mmHg  Pulse 79  Temp(Src) 97.9 F (36.6 C) (Oral)  Resp 18  Ht 5\' 9"  (1.753 m)  Wt 185 lb (83.915 kg)  BMI 27.31 kg/m2  SpO2 95%  Physical Exam  Constitutional: He is oriented to person, place, and time. He appears well-developed and well-nourished. No distress.  HENT:  Head: Normocephalic and atraumatic.  Eyes: Conjunctivae and EOM are normal.  Neck: Neck supple. No tracheal deviation present.  Cardiovascular: Normal rate.   Pulmonary/Chest: Effort normal. No respiratory distress.  Musculoskeletal: Normal range of motion.  Neurological: He is alert and oriented to person, place, and time.  Skin: Skin is warm and dry.  Psychiatric: He has a normal mood and affect. His behavior is normal.  Nursing note and vitals  reviewed.  ED Course  LACERATION REPAIR Date/Time: 10/08/2014 9:25 PM Performed by: Dan Freeman Authorized by: Dan Freeman Consent: Verbal consent obtained. Written consent not obtained. Risks and benefits: risks, benefits and alternatives were discussed Consent given by: patient Patient understanding: patient states understanding of the procedure being performed Patient identity confirmed: verbally with patient Body area: upper extremity Location details: right long finger Laceration length: 1  cm Foreign bodies: no foreign bodies Tendon involvement: none Vascular damage: no Anesthesia: digital block Local anesthetic: bupivacaine 0.5% without epinephrine and lidocaine 1% without epinephrine Anesthetic total: 2 ml Patient sedated: no Irrigation solution: saline Amount of cleaning: standard Debridement: none Degree of undermining: none Skin closure: 4-0 Prolene Number of sutures: 4 Technique: simple Approximation: loose Approximation difficulty: simple Dressing: antibiotic ointment Patient tolerance: Patient tolerated the procedure well with no immediate complications  NAIL REMOVAL Date/Time: 10/08/2014 9:27 PM Performed by: Dan Freeman Authorized by: Dan Freeman Consent: Verbal consent obtained. Written consent not obtained. Risks and benefits: risks, benefits and alternatives were discussed Consent given by: patient Patient understanding: patient states understanding of the procedure being performed Patient identity confirmed: verbally with patient Time out: Immediately prior to procedure a "time out" was called to verify the correct patient, procedure, equipment, support staff and site/side marked as required. Location: right hand Location details: right long finger Anesthesia: digital block Local anesthetic: lidocaine 1% without epinephrine and bupivacaine 0.5% without epinephrine Patient tolerance: Patient tolerated the procedure well with no immediate complications Comments: subungal hematoma   (including critical care time)  DIAGNOSTIC STUDIES: Oxygen Saturation is 95% on RA, normal by my interpretation.    COORDINATION OF CARE: 8:08 PM-  Discussed plans to apply sutures to affected area. Pt advised of plan for treatment and pt agrees.  Labs Review Labs Reviewed - No data to display  Imaging Review No results found.   EKG Interpretation None     MDM   Final diagnoses:  Subungual hematoma of finger of right hand, initial encounter   Laceration       I personally performed the services described in this documentation, which was scribed in my presence. The recorded information has been reviewed and is accurate.  Dan Balding, NP 10/08/14 2133  Dan Muskrat, MD 10/08/14 [phone]

## 2016-01-27 IMAGING — CR DG LUMBAR SPINE COMPLETE 4+V
5 series · 5 of 5 positions shown · non-contrast
Comparison: None.

CLINICAL DATA: Low back pain.

EXAM:
LUMBAR SPINE - COMPLETE 4+ VIEW

[t l-spine a.p.]
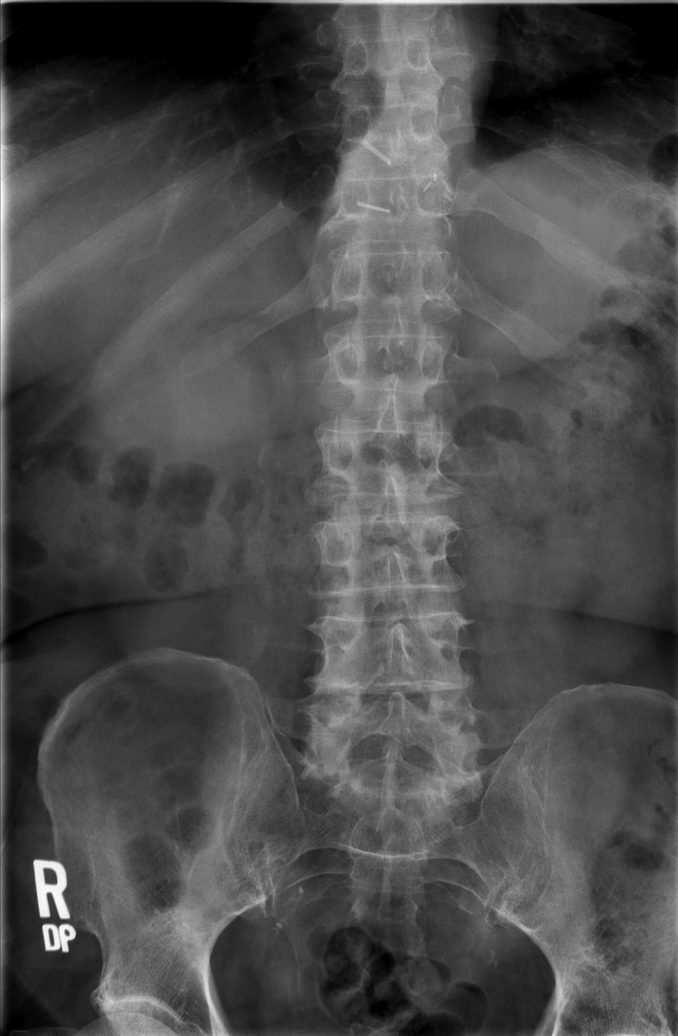

[t l-spine oblique exposure (1 of 2)]
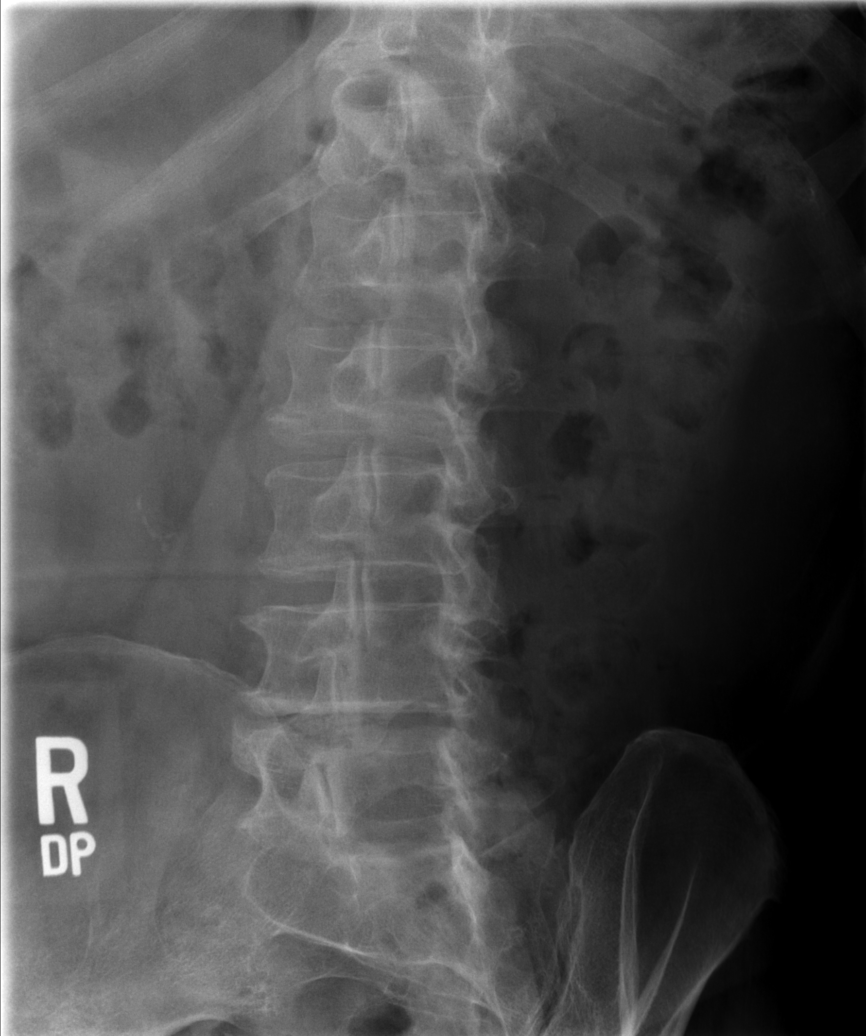

[t l-spine oblique exposure (2 of 2)]
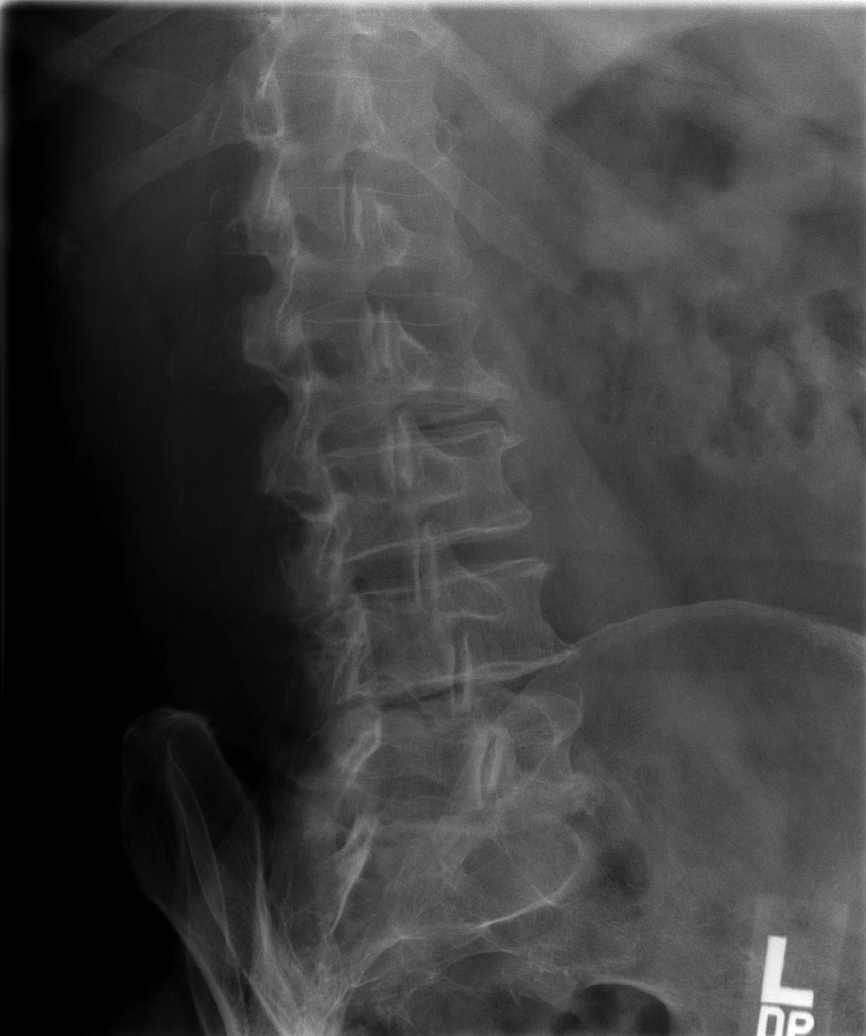

[t l-spine lat]
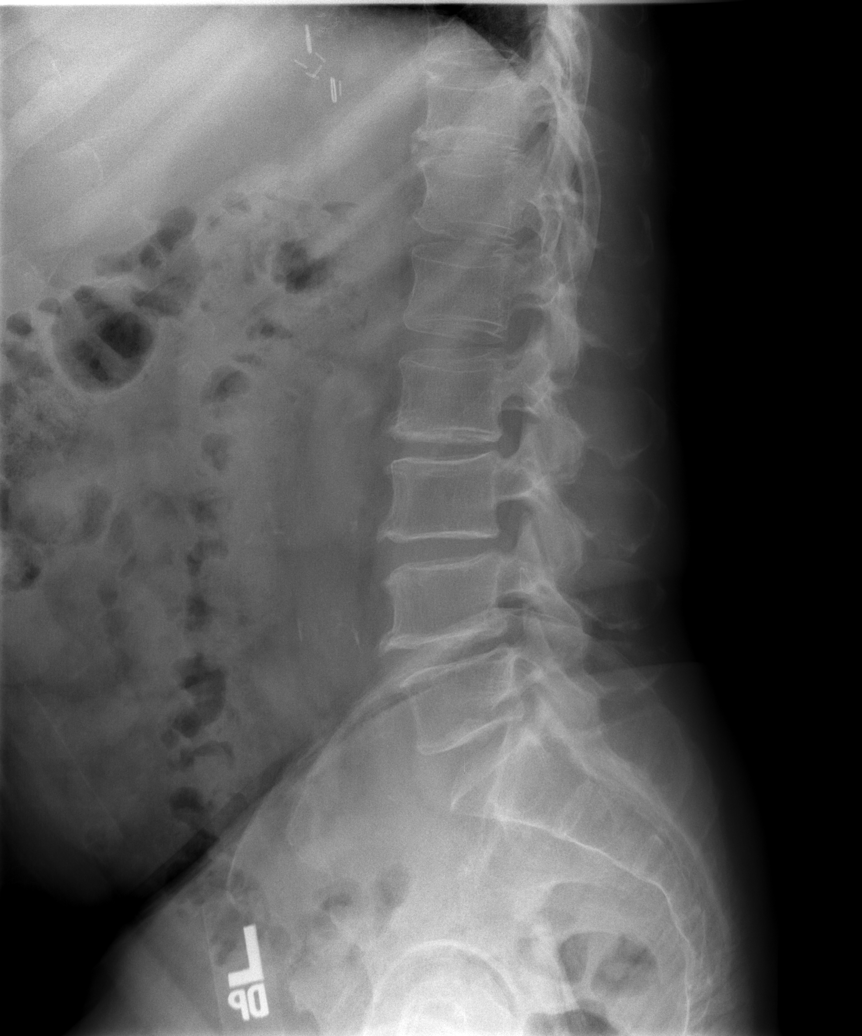

[t l-spine l5-s1 spot]
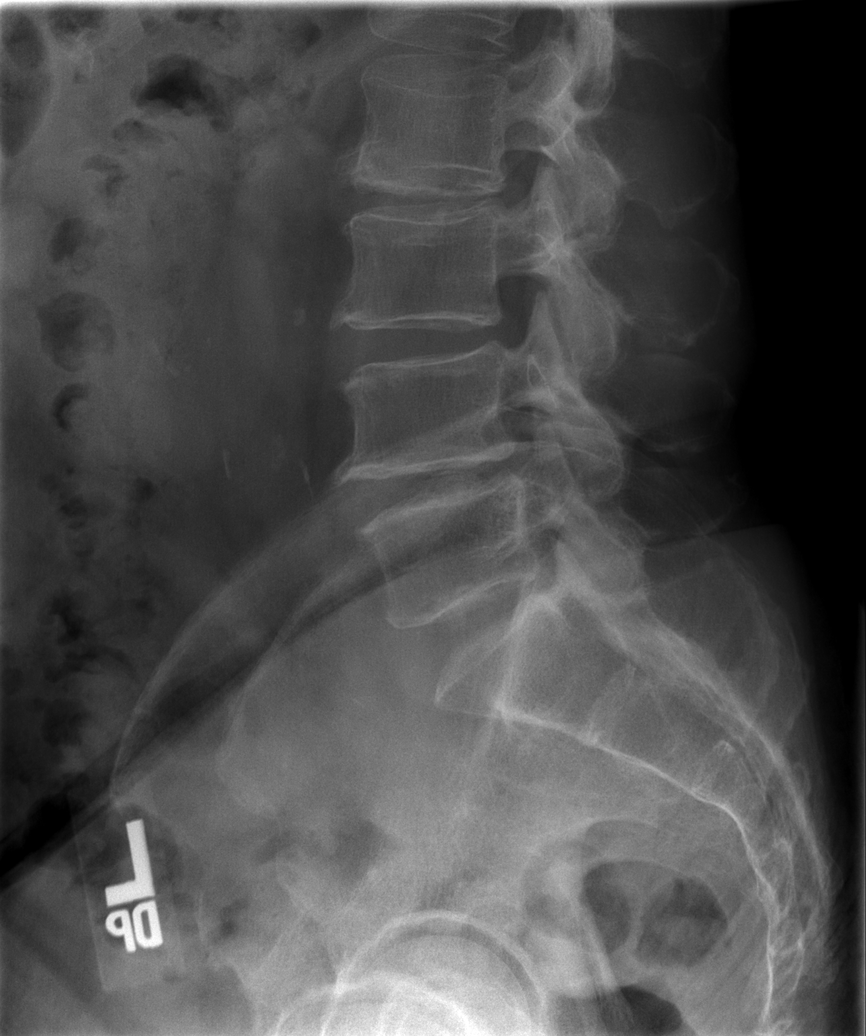

[5 of 5 positions shown; findings below may reference images not displayed]

FINDINGS: Normal alignment of the lumbar vertebral bodies. Moderate disc
disease and facet disease but no acute bony findings or destructive
bony changes. No pars defects. The visualized bony pelvis is intact.
Aortic calcifications are noted without definite aneurysm.
IMPRESSION: Moderate degenerative changes but no acute bony findings.

## 2018-06-27 ENCOUNTER — Other Ambulatory Visit: Payer: Self-pay

## 2018-06-27 ENCOUNTER — Encounter (HOSPITAL_COMMUNITY): Payer: Self-pay | Admitting: Emergency Medicine

## 2018-06-27 ENCOUNTER — Emergency Department (HOSPITAL_COMMUNITY): Payer: Medicare Other

## 2018-06-27 ENCOUNTER — Inpatient Hospital Stay (HOSPITAL_COMMUNITY)
Admission: EM | Admit: 2018-06-27 | Discharge: 2018-07-01 | DRG: 191 | Disposition: A | Discharge status: Home / Self Care | Payer: Medicare Other | Attending: Internal Medicine | Admitting: Internal Medicine

## 2018-06-27 DIAGNOSIS — J440 Chronic obstructive pulmonary disease with acute lower respiratory infection: Secondary | ICD-10-CM | POA: Diagnosis present

## 2018-06-27 DIAGNOSIS — Q248 Other specified congenital malformations of heart: Secondary | ICD-10-CM

## 2018-06-27 DIAGNOSIS — J441 Chronic obstructive pulmonary disease with (acute) exacerbation: Secondary | ICD-10-CM | POA: Diagnosis not present

## 2018-06-27 DIAGNOSIS — Z7982 Long term (current) use of aspirin: Secondary | ICD-10-CM

## 2018-06-27 DIAGNOSIS — Z8546 Personal history of malignant neoplasm of prostate: Secondary | ICD-10-CM

## 2018-06-27 DIAGNOSIS — Z7951 Long term (current) use of inhaled steroids: Secondary | ICD-10-CM

## 2018-06-27 DIAGNOSIS — R911 Solitary pulmonary nodule: Secondary | ICD-10-CM | POA: Diagnosis present

## 2018-06-27 DIAGNOSIS — J189 Pneumonia, unspecified organism: Secondary | ICD-10-CM

## 2018-06-27 DIAGNOSIS — Z8701 Personal history of pneumonia (recurrent): Secondary | ICD-10-CM

## 2018-06-27 DIAGNOSIS — Z923 Personal history of irradiation: Secondary | ICD-10-CM

## 2018-06-27 DIAGNOSIS — Z833 Family history of diabetes mellitus: Secondary | ICD-10-CM

## 2018-06-27 DIAGNOSIS — R7989 Other specified abnormal findings of blood chemistry: Secondary | ICD-10-CM | POA: Diagnosis present

## 2018-06-27 DIAGNOSIS — R000 Tachycardia, unspecified: Secondary | ICD-10-CM | POA: Diagnosis present

## 2018-06-27 DIAGNOSIS — E039 Hypothyroidism, unspecified: Secondary | ICD-10-CM | POA: Diagnosis present

## 2018-06-27 DIAGNOSIS — D649 Anemia, unspecified: Secondary | ICD-10-CM | POA: Diagnosis present

## 2018-06-27 DIAGNOSIS — I421 Obstructive hypertrophic cardiomyopathy: Secondary | ICD-10-CM

## 2018-06-27 DIAGNOSIS — J208 Acute bronchitis due to other specified organisms: Secondary | ICD-10-CM | POA: Diagnosis present

## 2018-06-27 DIAGNOSIS — Z79890 Hormone replacement therapy (postmenopausal): Secondary | ICD-10-CM

## 2018-06-27 DIAGNOSIS — I739 Peripheral vascular disease, unspecified: Secondary | ICD-10-CM | POA: Diagnosis present

## 2018-06-27 DIAGNOSIS — I10 Essential (primary) hypertension: Secondary | ICD-10-CM | POA: Diagnosis present

## 2018-06-27 DIAGNOSIS — R0902 Hypoxemia: Secondary | ICD-10-CM | POA: Diagnosis present

## 2018-06-27 HISTORY — DX: Chronic obstructive pulmonary disease, unspecified: J44.9

## 2018-06-27 HISTORY — DX: Unspecified osteoarthritis, unspecified site: M19.90

## 2018-06-27 HISTORY — DX: Essential (primary) hypertension: I10

## 2018-06-27 HISTORY — DX: Cardiac murmur, unspecified: R01.1

## 2018-06-27 HISTORY — DX: Obstructive hypertrophic cardiomyopathy: I42.1

## 2018-06-27 LAB — COMPREHENSIVE METABOLIC PANEL
ALBUMIN: 3.5 g/dL (ref 3.5–5.0)
ALT: 25 U/L (ref 0–44)
AST: 27 U/L (ref 15–41)
Alkaline Phosphatase: 67 U/L (ref 38–126)
Anion gap: 7 (ref 5–15)
BUN: 20 mg/dL (ref 8–23)
CALCIUM: 8.8 mg/dL — AB (ref 8.9–10.3)
CHLORIDE: 111 mmol/L (ref 98–111)
CO2: 22 mmol/L (ref 22–32)
CREATININE: 1.12 mg/dL (ref 0.61–1.24)
GFR calc Af Amer: >60 mL/min (ref >60)
GFR calc non Af Amer: 58 mL/min — ABNORMAL LOW (ref >60)
GLUCOSE: 133 mg/dL — AB (ref 70–99)
Potassium: 4.8 mmol/L (ref 3.5–5.1)
SODIUM: 140 mmol/L (ref 135–145)
Total Bilirubin: 0.7 mg/dL (ref 0.3–1.2)
Total Protein: 6.6 g/dL (ref 6.5–8.1)

## 2018-06-27 LAB — CBC WITH DIFFERENTIAL/PLATELET
Abs Immature Granulocytes: 0.1 10*3/uL (ref 0.0–0.1)
BASOS PCT: 0 %
Basophils Absolute: 0 10*3/uL (ref 0.0–0.1)
Eosinophils Absolute: 0 10*3/uL (ref 0.0–0.7)
Eosinophils Relative: 0 %
HEMATOCRIT: 36.9 % — AB (ref 39.0–52.0)
Hemoglobin: 11.7 g/dL — ABNORMAL LOW (ref 13.0–17.0)
IMMATURE GRANULOCYTES: 1 %
Lymphocytes Relative: 13 %
Lymphs Abs: 0.9 10*3/uL (ref 0.7–4.0)
MCH: 27.9 pg (ref 26.0–34.0)
MCHC: 31.7 g/dL (ref 30.0–36.0)
MCV: 88.1 fL (ref 78.0–100.0)
MONO ABS: 0.2 10*3/uL (ref 0.1–1.0)
Monocytes Relative: 3 %
Neutro Abs: 5.5 10*3/uL (ref 1.7–7.7)
Neutrophils Relative %: 83 %
PLATELETS: 199 10*3/uL (ref 150–400)
RBC: 4.19 MIL/uL — ABNORMAL LOW (ref 4.22–5.81)
RDW: 13.9 % (ref 11.5–15.5)
WBC: 6.7 10*3/uL (ref 4.0–10.5)

## 2018-06-27 LAB — URINALYSIS, ROUTINE W REFLEX MICROSCOPIC
Bilirubin Urine: NEGATIVE
Glucose, UA: NEGATIVE mg/dL
Hgb urine dipstick: NEGATIVE
KETONES UR: NEGATIVE mg/dL
LEUKOCYTES UA: NEGATIVE
Nitrite: NEGATIVE
Protein, ur: NEGATIVE mg/dL
SPECIFIC GRAVITY, URINE: 1.014 (ref 1.005–1.030)
pH: 5 (ref 5.0–8.0)

## 2018-06-27 LAB — I-STAT TROPONIN, ED: Troponin i, poc: 0.09 ng/mL (ref 0.00–0.08)

## 2018-06-27 LAB — I-STAT CG4 LACTIC ACID, ED: Lactic Acid, Venous: 1.76 mmol/L (ref 0.5–1.9)

## 2018-06-27 MED ORDER — METHYLPREDNISOLONE SODIUM SUCC 125 MG IJ SOLR
125.0000 mg | Freq: Once | INTRAMUSCULAR | Status: AC
Start: 2018-06-27 — End: 2018-06-27
  Administered 2018-06-27: 125 mg via INTRAVENOUS
  Filled 2018-06-27: qty 2

## 2018-06-27 MED ORDER — IPRATROPIUM-ALBUTEROL 0.5-2.5 (3) MG/3ML IN SOLN
3.0000 mL | Freq: Once | RESPIRATORY_TRACT | Status: AC
Start: 2018-06-27 — End: 2018-06-27
  Administered 2018-06-27: 3 mL via RESPIRATORY_TRACT
  Filled 2018-06-27: qty 3

## 2018-06-27 MED ORDER — MAGNESIUM SULFATE 2 GM/50ML IV SOLN
2.0000 g | Freq: Once | INTRAVENOUS | Status: AC
  Administered 2018-06-27: 2 g via INTRAVENOUS
  Filled 2018-06-27: qty 50

## 2018-06-27 NOTE — ED Notes (Signed)
Pt oxygen dropped to 88% while ambulating. Upon rest it went back to 95%

## 2018-06-27 NOTE — ED Provider Notes (Signed)
[facility] EMERGENCY DEPARTMENT Provider Note   CSN: [id number] Arrival date & time: 06/27/18  1617     History   Chief Complaint Chief Complaint  Patient presents with  . Cough  . congestion  . Near Syncope    HPI Dan Freeman is a 84 y.o. male who presents with SOB, wheezing, cough. PMH significant for COPD, hx of prostate cancer. He states that he was visiting a family member in the hospital on Sunday and since then has had hot/cold chills, sweats, and a cough. The cough is productive at times but sometimes he cannot cough it up. He reports progressively worsening SOB and wheezing. He saw his PCP who prescribed steroids, Doxy, and inahlers. He's been using this without relief. He coughs so hard that sometimes his ribs hurt and he almost passes out. No known fever, chest pain, leg swelling.   HPI  Past Medical History:  Diagnosis Date  . Cancer ([place])     prostrate     radiation  . Esophageal diverticulum    s/p esophageal resection    There are no active problems to display for this patient.   Past Surgical History:  Procedure Laterality Date  . ESOPHAGECTOMY    . SHOULDER ARTHROSCOPY  01/05/2012   Procedure: ARTHROSCOPY SHOULDER;  Surgeon: Sharmon Revere, MD;  Location: [facility];  Service: Orthopedics;  Laterality: Right;  acromioplasty        Home Medications    Prior to Admission medications   Medication Sig Start Date End Date Taking? Authorizing Provider  albuterol (PROVENTIL HFA;VENTOLIN HFA) 108 (90 BASE) MCG/ACT inhaler Inhale 2 puffs into the lungs every 6 (six) hours as needed. For shortness of breath    [provider]  aspirin EC 81 MG tablet Take 81 mg by mouth daily.    [provider]  cephALEXin (KEFLEX) 250 MG capsule Take 1 capsule (250 mg total) by mouth 3 (three) times daily. 10/08/14   Junius Creamer, NP  GINKGO BILOBA EXTRACT PO Take 1 capsule by mouth 2 (two) times daily.    [provider]  Misc  Natural Products (GINSENG COMPLEX PO) Take 1 capsule by mouth 2 (two) times daily.    [provider]    Family History History reviewed. No pertinent family history.  Social History Social History   Tobacco Use  . Smoking status: Never Smoker  Substance Use Topics  . Alcohol use: No  . Drug use: No     Allergies   Patient has no known allergies.   Review of Systems Review of Systems  Constitutional: Positive for chills, diaphoresis and fatigue. Negative for fever.  Respiratory: Positive for cough, shortness of breath and wheezing.   Cardiovascular: Negative for chest pain and leg swelling.  Gastrointestinal: Negative for abdominal pain.  Neurological: Positive for light-headedness. Negative for syncope.  All other systems reviewed and are negative.    Physical Exam Updated Vital Signs BP (!) 146/89   Pulse 67   Temp 98.6 F (37 C) (Oral)   Resp 19   Ht 5' 9.5" (1.765 m)   Wt 85.7 kg   SpO2 98%   BMI 27.51 kg/m   Physical Exam  Constitutional: He is oriented to person, place, and time. He appears well-developed and well-nourished. No distress.  Calm and cooperative. NAD  HENT:  Head: Normocephalic and atraumatic.  Eyes: Pupils are equal, round, and reactive to light. Conjunctivae are normal. Right eye exhibits no discharge. Left eye  exhibits no discharge. No scleral icterus.  Neck: Normal range of motion.  Cardiovascular: Normal rate and regular rhythm.  Pulmonary/Chest: Effort normal. No stridor. No respiratory distress. He has wheezes (diffuse inspiratory/expiratory wheezes). He has no rales. He exhibits no tenderness.  Abdominal: He exhibits no distension.  Musculoskeletal:  No peripheral edema  Neurological: He is alert and oriented to person, place, and time.  Skin: Skin is warm and dry.  Psychiatric: He has a normal mood and affect. His behavior is normal.  Nursing note and vitals reviewed.    ED Treatments / Results  Labs (all labs  ordered are listed, but only abnormal results are displayed) Labs Reviewed  COMPREHENSIVE METABOLIC PANEL - Abnormal; Notable for the following components:      Result Value   Glucose, Bld 133 (*)    Calcium 8.8 (*)    GFR calc non Af Amer 58 (*)    All other components within normal limits  CBC WITH DIFFERENTIAL/PLATELET - Abnormal; Notable for the following components:   RBC 4.19 (*)    Hemoglobin 11.7 (*)    HCT 36.9 (*)    All other components within normal limits  URINALYSIS, ROUTINE W REFLEX MICROSCOPIC - Abnormal; Notable for the following components:   Color, Urine STRAW (*)    All other components within normal limits  I-STAT TROPONIN, ED - Abnormal; Notable for the following components:   Troponin i, poc 0.09 (*)    All other components within normal limits  I-STAT CG4 LACTIC ACID, ED  I-STAT CG4 LACTIC ACID, ED    EKG EKG Interpretation  Date/Time:  Thursday June 27 2018 16:50:51 EDT Ventricular Rate:  86 PR Interval:  178 QRS Duration: 128 QT Interval:  404 QTC Calculation: 483 R Axis:   -64 Text Interpretation:  Sinus rhythm with occasional Premature ventricular complexes Right bundle branch block Left anterior fascicular block Left ventricular hypertrophy with repolarization abnormality Anterolateral infarct , age undetermined Abnormal ECG similar pattern to prior 3/13 Confirmed by Aletta Edouard [phone]) on 06/27/2018 8:05:58 PM   Radiology Dg Chest 2 View  Result Date: 06/27/2018 CLINICAL DATA:  Short of breath, cough, chills EXAM: CHEST - 2 VIEW COMPARISON:  02/19/2018 FINDINGS: Small nodular density at the left apex is stable. The heart is borderline enlarged. Bibasilar opacities left greater than right are increased compatible with atelectasis superimposed upon scarring. Normal vascularity. No pneumothorax or pleural effusion. Left lateral rib deformity is chronic. IMPRESSION: Stable nodular density at the left apex. If not already performed, CT is  recommended. Bibasilar atelectasis superimposed upon scarring. Electronically Signed   By: Marybelle Killings M.D.   On: 06/27/2018 18:18    Procedures Procedures (including critical care time)  Medications Ordered in ED Medications  ipratropium-albuterol (DUONEB) 0.5-2.5 (3) MG/3ML nebulizer solution 3 mL (has no administration in time range)  magnesium sulfate IVPB 2 g 50 mL (2 g Intravenous New Bag/Given 06/27/18 2110)  methylPREDNISolone sodium succinate (SOLU-MEDROL) 125 mg/2 mL injection 125 mg (125 mg Intravenous Given 06/27/18 2103)     Initial Impression / Assessment and Plan / ED Course  I have reviewed the triage vital signs and the nursing notes.  Pertinent labs & imaging results that were available during my care of the patient were reviewed by me and considered in my medical decision making (see chart for details).  Clinical Course as of Jun 27 2109  Thu Jun 27, 3194  6170 84 year old male here with increased shortness of breath.  He has COPD  and he states he was with a family member who was diagnosed with pneumonia last week.  Since then he has had increased shortness of breath.  He has been on steroids and continues to worsen.  He is got diffuse wheezing here and is hypoxic when ambulated.  He will likely need to be admitted for further management.   [MB]    Clinical Course User Index [MB] Hayden Rasmussen, MD    84 year old male presents with cough, SOB, wheezing consistent with COPD exacerbation. His vitals are normal at rest however sats drop to 88% with ambulation. He has been on steroids, breathing tx, and antibiotics with worsening symptoms. CBC is remarkable for mild anemia. CMP is normal. CXR is negative but does show a stable lung nodule. A CT of the chest was obtained in careeverywhere one month ago which characterized it as a granuloma. Due to pt's symptoms, will admit for failure of outpatient tx. Shared visit with Dr. Melina Copa. Discussed with Dr. Hal Hope who will  admit.  Final Clinical Impressions(s) / ED Diagnoses   Final diagnoses:  COPD exacerbation [facility])    ED Discharge Orders    None       Recardo Evangelist, PA-C 06/27/18 2259    Hayden Rasmussen, MD 06/28/18 1054

## 2018-06-27 NOTE — ED Triage Notes (Signed)
Pt presents to ER with cough x 1 wk and he suspects pneumonia with hx of same, having chills, feeling near-syncopal when he has coughing spells; seen PCP Tuesday and suggested they come here

## 2018-06-27 NOTE — ED Notes (Signed)
Pt cleared to eat, per Dr Hal Hope

## 2018-06-27 NOTE — ED Provider Notes (Signed)
MSE was initiated and I personally evaluated the patient  4:49 PM on June 27, 2018.  Patient placed in [facility] pathway, seen and evaluated   Chief Complaint: dyspnea, cough  HPI:   84 yo male presents with complaints of dyspnea and cough x 1 week. Cough intermittent productive with green mucous sputum, coughing spells so bad that he feels he may pass out at times, no syncope. Dyspnea worse with exertion. Ribs are sore from coughing, otherwise no chest pain. Subjective fever and chills. Seen by PCP placed on doxycycline and prednisone for possible pnumonia, taking as prescribed without significant change- started 2 days prior.   ROS: + cough, rib pain, dyspnea, subjective fever/chills. - syncope  Physical Exam:   Gen: No distress  Neuro: Awake and Alert  Skin: Warm    Focused Exam: Heart: RRR. Lungs: Course breath sounds throughout.    Initiation of care has begun. The patient has been counseled on the process, plan, and necessity for staying for the completion/evaluation, and the remainder of the medical screening examination. I discussed with patient  and if present family/friends the importance of alerting staff to any new or worsening symptoms or any other concerns throughout his/her ER visit.     Leafy Kindle 06/27/18 1651    Davonna Belling, MD 06/28/18 0104

## 2018-06-28 ENCOUNTER — Observation Stay (HOSPITAL_BASED_OUTPATIENT_CLINIC_OR_DEPARTMENT_OTHER): Payer: Medicare Other

## 2018-06-28 ENCOUNTER — Encounter (HOSPITAL_COMMUNITY): Payer: Self-pay | Admitting: Internal Medicine

## 2018-06-28 DIAGNOSIS — I421 Obstructive hypertrophic cardiomyopathy: Secondary | ICD-10-CM

## 2018-06-28 DIAGNOSIS — Q248 Other specified congenital malformations of heart: Secondary | ICD-10-CM | POA: Diagnosis not present

## 2018-06-28 DIAGNOSIS — J441 Chronic obstructive pulmonary disease with (acute) exacerbation: Secondary | ICD-10-CM | POA: Diagnosis present

## 2018-06-28 DIAGNOSIS — J449 Chronic obstructive pulmonary disease, unspecified: Secondary | ICD-10-CM | POA: Insufficient documentation

## 2018-06-28 DIAGNOSIS — I340 Nonrheumatic mitral (valve) insufficiency: Secondary | ICD-10-CM

## 2018-06-28 DIAGNOSIS — I10 Essential (primary) hypertension: Secondary | ICD-10-CM | POA: Diagnosis present

## 2018-06-28 DIAGNOSIS — E039 Hypothyroidism, unspecified: Secondary | ICD-10-CM | POA: Diagnosis present

## 2018-06-28 DIAGNOSIS — R0902 Hypoxemia: Secondary | ICD-10-CM | POA: Diagnosis present

## 2018-06-28 DIAGNOSIS — R911 Solitary pulmonary nodule: Secondary | ICD-10-CM | POA: Diagnosis present

## 2018-06-28 DIAGNOSIS — Z923 Personal history of irradiation: Secondary | ICD-10-CM | POA: Diagnosis not present

## 2018-06-28 DIAGNOSIS — Z7982 Long term (current) use of aspirin: Secondary | ICD-10-CM | POA: Diagnosis not present

## 2018-06-28 DIAGNOSIS — J440 Chronic obstructive pulmonary disease with acute lower respiratory infection: Secondary | ICD-10-CM | POA: Diagnosis present

## 2018-06-28 DIAGNOSIS — I739 Peripheral vascular disease, unspecified: Secondary | ICD-10-CM | POA: Diagnosis present

## 2018-06-28 DIAGNOSIS — Z79890 Hormone replacement therapy (postmenopausal): Secondary | ICD-10-CM | POA: Diagnosis not present

## 2018-06-28 DIAGNOSIS — J208 Acute bronchitis due to other specified organisms: Secondary | ICD-10-CM | POA: Diagnosis present

## 2018-06-28 DIAGNOSIS — R000 Tachycardia, unspecified: Secondary | ICD-10-CM | POA: Diagnosis present

## 2018-06-28 DIAGNOSIS — D649 Anemia, unspecified: Secondary | ICD-10-CM | POA: Diagnosis present

## 2018-06-28 DIAGNOSIS — J189 Pneumonia, unspecified organism: Secondary | ICD-10-CM

## 2018-06-28 DIAGNOSIS — R7989 Other specified abnormal findings of blood chemistry: Secondary | ICD-10-CM | POA: Diagnosis present

## 2018-06-28 DIAGNOSIS — Z8546 Personal history of malignant neoplasm of prostate: Secondary | ICD-10-CM | POA: Diagnosis not present

## 2018-06-28 DIAGNOSIS — Z7951 Long term (current) use of inhaled steroids: Secondary | ICD-10-CM | POA: Diagnosis not present

## 2018-06-28 DIAGNOSIS — Z8701 Personal history of pneumonia (recurrent): Secondary | ICD-10-CM | POA: Diagnosis not present

## 2018-06-28 DIAGNOSIS — Z833 Family history of diabetes mellitus: Secondary | ICD-10-CM | POA: Diagnosis not present

## 2018-06-28 LAB — BASIC METABOLIC PANEL
Anion gap: 11 (ref 5–15)
BUN: 17 mg/dL (ref 8–23)
CALCIUM: 9 mg/dL (ref 8.9–10.3)
CO2: 23 mmol/L (ref 22–32)
Chloride: 106 mmol/L (ref 98–111)
Creatinine, Ser: 1.07 mg/dL (ref 0.61–1.24)
GFR calc Af Amer: >60 mL/min (ref >60)
GLUCOSE: 128 mg/dL — AB (ref 70–99)
Potassium: 4.3 mmol/L (ref 3.5–5.1)
SODIUM: 140 mmol/L (ref 135–145)

## 2018-06-28 LAB — ECHOCARDIOGRAM COMPLETE
Height: 69 in
Weight: 3024 oz

## 2018-06-28 LAB — IRON AND TIBC
Iron: 56 ug/dL (ref 45–182)
Saturation Ratios: 20 % (ref 17.9–39.5)
TIBC: 276 ug/dL (ref 250–450)
UIBC: 220 ug/dL

## 2018-06-28 LAB — FERRITIN: FERRITIN: 75 ng/mL (ref 24–336)

## 2018-06-28 LAB — TROPONIN I
TROPONIN I: 0.12 ng/mL — AB (ref <0.03)
Troponin I: 0.13 ng/mL (ref <0.03)
Troponin I: 0.14 ng/mL (ref <0.03)

## 2018-06-28 LAB — CREATININE, SERUM
Creatinine, Ser: 1.2 mg/dL (ref 0.61–1.24)
GFR calc Af Amer: >60 mL/min (ref >60)
GFR calc non Af Amer: 53 mL/min — ABNORMAL LOW (ref >60)

## 2018-06-28 LAB — TSH: TSH: 0.708 u[IU]/mL (ref 0.350–4.500)

## 2018-06-28 LAB — CBC
HEMATOCRIT: 37.5 % — AB (ref 39.0–52.0)
HEMATOCRIT: 38.4 % — AB (ref 39.0–52.0)
Hemoglobin: 12.3 g/dL — ABNORMAL LOW (ref 13.0–17.0)
Hemoglobin: 12.2 g/dL — ABNORMAL LOW (ref 13.0–17.0)
MCH: 28 pg (ref 26.0–34.0)
MCH: 28.2 pg (ref 26.0–34.0)
MCHC: 32 g/dL (ref 30.0–36.0)
MCHC: 32.5 g/dL (ref 30.0–36.0)
MCV: 86.8 fL (ref 78.0–100.0)
MCV: 87.5 fL (ref 78.0–100.0)
PLATELETS: 207 10*3/uL (ref 150–400)
Platelets: 203 10*3/uL (ref 150–400)
RBC: 4.39 MIL/uL (ref 4.22–5.81)
RBC: 4.32 MIL/uL (ref 4.22–5.81)
RDW: 13.8 % (ref 11.5–15.5)
RDW: 14 % (ref 11.5–15.5)
WBC: 6.9 10*3/uL (ref 4.0–10.5)
WBC: 8 10*3/uL (ref 4.0–10.5)

## 2018-06-28 LAB — VITAMIN B12: Vitamin B-12: 4571 pg/mL — ABNORMAL HIGH (ref 180–914)

## 2018-06-28 LAB — BRAIN NATRIURETIC PEPTIDE: B Natriuretic Peptide: 545.9 pg/mL — ABNORMAL HIGH (ref 0.0–100.0)

## 2018-06-28 LAB — RETICULOCYTES
RBC.: 4.32 MIL/uL (ref 4.22–5.81)
RETIC CT PCT: 1.7 % (ref 0.4–3.1)
Retic Count, Absolute: 73.4 10*3/uL (ref 19.0–186.0)

## 2018-06-28 LAB — FOLATE: Folate: 16.6 ng/mL (ref >5.9)

## 2018-06-28 MED ORDER — METHYLPREDNISOLONE SODIUM SUCC 125 MG IJ SOLR: 60.0000 mg | Freq: Two times a day (BID) | INTRAMUSCULAR | Status: DC

## 2018-06-28 MED ORDER — ALBUTEROL SULFATE (2.5 MG/3ML) 0.083% IN NEBU
2.5000 mg | INHALATION_SOLUTION | RESPIRATORY_TRACT | Status: DC
  Administered 2018-06-28 (×5): 2.5 mg via RESPIRATORY_TRACT
  Filled 2018-06-28 (×5): qty 3

## 2018-06-28 MED ORDER — LEVOFLOXACIN IN D5W 500 MG/100ML IV SOLN
500.0000 mg | INTRAVENOUS | Status: DC
  Administered 2018-06-28 – 2018-06-29 (×2): 500 mg via INTRAVENOUS
  Filled 2018-06-28 (×2): qty 100

## 2018-06-28 MED ORDER — ACETAMINOPHEN 650 MG RE SUPP: 650.0000 mg | Freq: Four times a day (QID) | RECTAL | Status: DC | PRN

## 2018-06-28 MED ORDER — METHYLPREDNISOLONE SODIUM SUCC 40 MG IJ SOLR
40.0000 mg | Freq: Two times a day (BID) | INTRAMUSCULAR | Status: DC
  Administered 2018-06-28 – 2018-06-29 (×3): 40 mg via INTRAVENOUS
  Filled 2018-06-28 (×3): qty 1

## 2018-06-28 MED ORDER — IPRATROPIUM BROMIDE 0.02 % IN SOLN
0.5000 mg | RESPIRATORY_TRACT | Status: DC
  Administered 2018-06-28 (×2): 0.5 mg via RESPIRATORY_TRACT
  Filled 2018-06-28 (×2): qty 2.5

## 2018-06-28 MED ORDER — ALBUTEROL SULFATE (2.5 MG/3ML) 0.083% IN NEBU
2.5000 mg | INHALATION_SOLUTION | RESPIRATORY_TRACT | Status: DC
  Administered 2018-06-28 (×2): 2.5 mg via RESPIRATORY_TRACT
  Filled 2018-06-28 (×2): qty 3

## 2018-06-28 MED ORDER — ENOXAPARIN SODIUM 40 MG/0.4ML ~~LOC~~ SOLN
40.0000 mg | SUBCUTANEOUS | Status: DC
  Administered 2018-06-28 – 2018-07-01 (×4): 40 mg via SUBCUTANEOUS
  Filled 2018-06-28 (×4): qty 0.4

## 2018-06-28 MED ORDER — FAMOTIDINE 20 MG PO TABS
20.0000 mg | ORAL_TABLET | Freq: Two times a day (BID) | ORAL | Status: DC
  Administered 2018-06-28 – 2018-07-01 (×8): 20 mg via ORAL
  Filled 2018-06-28 (×8): qty 1

## 2018-06-28 MED ORDER — BUDESONIDE 0.25 MG/2ML IN SUSP
0.2500 mg | Freq: Two times a day (BID) | RESPIRATORY_TRACT | Status: DC
  Administered 2018-06-28 – 2018-07-01 (×8): 0.25 mg via RESPIRATORY_TRACT
  Filled 2018-06-28 (×8): qty 2

## 2018-06-28 MED ORDER — ONDANSETRON HCL 4 MG/2ML IJ SOLN: 4.0000 mg | Freq: Four times a day (QID) | INTRAMUSCULAR | Status: DC | PRN

## 2018-06-28 MED ORDER — VITAMIN B-12 100 MCG PO TABS
50.0000 ug | ORAL_TABLET | Freq: Every day | ORAL | Status: DC
  Administered 2018-06-28: 50 ug via ORAL
  Filled 2018-06-28: qty 1

## 2018-06-28 MED ORDER — ALBUTEROL SULFATE (2.5 MG/3ML) 0.083% IN NEBU
2.5000 mg | INHALATION_SOLUTION | RESPIRATORY_TRACT | Status: DC | PRN
  Administered 2018-06-30: 2.5 mg via RESPIRATORY_TRACT
  Filled 2018-06-28 (×2): qty 3

## 2018-06-28 MED ORDER — CARVEDILOL 6.25 MG PO TABS
6.2500 mg | ORAL_TABLET | Freq: Two times a day (BID) | ORAL | Status: DC
  Administered 2018-06-28 – 2018-07-01 (×7): 6.25 mg via ORAL
  Filled 2018-06-28 (×7): qty 1

## 2018-06-28 MED ORDER — LEVOTHYROXINE SODIUM 50 MCG PO TABS
50.0000 ug | ORAL_TABLET | Freq: Every day | ORAL | Status: DC
  Administered 2018-06-28 – 2018-07-01 (×4): 50 ug via ORAL
  Filled 2018-06-28 (×4): qty 1

## 2018-06-28 MED ORDER — ASPIRIN EC 81 MG PO TBEC
81.0000 mg | DELAYED_RELEASE_TABLET | Freq: Every day | ORAL | Status: DC
  Administered 2018-06-28 – 2018-07-01 (×4): 81 mg via ORAL
  Filled 2018-06-28 (×4): qty 1

## 2018-06-28 MED ORDER — IPRATROPIUM-ALBUTEROL 0.5-2.5 (3) MG/3ML IN SOLN
3.0000 mL | RESPIRATORY_TRACT | Status: DC
  Administered 2018-06-28: 3 mL via RESPIRATORY_TRACT
  Filled 2018-06-28: qty 3

## 2018-06-28 MED ORDER — PERFLUTREN LIPID MICROSPHERE
1.0000 mL | INTRAVENOUS | Status: AC | PRN
  Administered 2018-06-28: 2 mL via INTRAVENOUS
  Filled 2018-06-28: qty 10

## 2018-06-28 MED ORDER — ACETAMINOPHEN 325 MG PO TABS: 650.0000 mg | ORAL_TABLET | Freq: Four times a day (QID) | ORAL | Status: DC | PRN

## 2018-06-28 MED ORDER — ONDANSETRON HCL 4 MG PO TABS: 4.0000 mg | ORAL_TABLET | Freq: Four times a day (QID) | ORAL | Status: DC | PRN

## 2018-06-28 NOTE — Progress Notes (Signed)
Pharmacy Antibiotic Note  Dan Freeman is a 84 y.o. male admitted on 06/27/2018  presents with SOB, wheezing, cough. Prior to admission he was taking steroids, Doxycycline, and inhalers and he's been using this without relief.   Pharmacy has been consulted for Levofloxacin dosing for COPD exacerbation.      Plan: Levofloxacin 500 mg IV q24h  Monitor clinical status, renal function and culture results daily.    Height: 5\' 9"  (175.3 cm) Weight: 189 lb (85.7 kg) IBW/kg (Calculated) : 70.7  Temp (24hrs), Avg:98.1 F (36.7 C), Min:97.5 F (36.4 C), Max:98.6 F (37 C)  Recent Labs  Lab 06/27/18 1655 06/27/18 2225  WBC 6.7  --   CREATININE 1.12  --   LATICACIDVEN  --  1.76    Estimated Creatinine Clearance: 52.3 mL/min (by C-G formula based on SCr of 1.12 mg/dL).    No Known Allergies   Thank you for allowing pharmacy to be a part of this patient's care.  Nicole Cella, RPh Clinical Pharmacist Please check AMION for all [facility] phone numbers After 10:00 PM, call [facility] [phone] 06/28/2018 12:45 AM

## 2018-06-28 NOTE — Progress Notes (Signed)
Pt transported to echo 

## 2018-06-28 NOTE — Progress Notes (Signed)
  Echocardiogram 2D Echocardiogram has been performed.  Zadok Holaway L Androw 06/28/2018, 10:55 AM
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
## 2018-06-28 NOTE — Progress Notes (Signed)
[facility] PROGRESS NOTE  Dan Freeman FGH:[id number] DOB: 11/30/1930 DOA: 06/27/2018 PCP: Maylon Peppers, MD  Assessment/Plan: 1. Acute COPD exacerbation - improved this am. Oxygen saturation level greater than 90% on room air at rest. No increased work of breathing. No s/sx of infectious process. continue  Solu-Medrol and Levaquin. Change  Nebulizer to scheduled. Continue Pulmicort. Ambulate in hall documenting respiratory effor 2. Mildly elevated troponin with some exertional symptoms and near syncopal episode. Troponin flat at 0.13.no chest pain. ekg with NSR occasional PVC and RBBB.  BNP is 549. Follow echo results 3. Normocytic normochromic anemia appears to be chronic. CBC stable. No s/sx active bleeding. 1. Hypothyroidism on Synthroid.  Patient states recent TSH was normal  Code Status: full Family Communication: none present Disposition Plan: home hopefully in am   Consultants:  none  Procedures:  none  Antibiotics:  levaquin 06/28/18>>  HPI/Subjective: Dan Freeman is a 84 y.o. male with history of COPD, prostate cancer status post radiation therapy presented 9/13 to the ER because of shortness of breath.  Patient stated he had been chronically short of breath and also was recently treated for pneumonia a few months ago. Started worsening shortness of breath previous 4 days after visiting his friend in the hospital..  Since that time patient had been having increasing shortness of breath productive cough wheezing.  Patient had gone to his PCP and was prescribed antibiotics and prednisone despite taking which patient was still short of breath.  Some pleuritic type of chest pain also on the left side.  Denies any nausea vomiting fever or chills or diarrhea.  The day prior patient had a near syncopal episode where he thought he was about to pass out from standing up suddenly but did not pass out.  At this point he decided to come to the ER.  Objective: Vitals:   06/28/18 0723 06/28/18 0727  BP:    Pulse: 89   Resp: (!) 22   Temp:    SpO2: 96% 96%    Intake/Output Summary (Last 24 hours) at 06/28/2018 1047 Last data filed at 06/28/2018 0700 Gross per 24 hour  Intake 240 ml  Output -  Net 240 ml   Filed Weights   06/27/18 1641  Weight: 85.7 kg    Exam:   General:  Sitting up in bed in no acute distress  Cardiovascular: RRR no MGR no LEE  Respiratory: mild increased work of breathing with conversation. BS quite distant. Mild end expiratory wheeze. No rhonchi. No crackles  Abdomen: obese soft +BS non-tender to palpation  Musculoskeletal: joints without swelling/ertythema   Data Reviewed: Basic Metabolic Panel: Recent Labs  Lab 06/27/18 1655 06/28/18 0054 06/28/18 0621  NA 140  --  140  K 4.8  --  4.3  CL 111  --  106  CO2 22  --  23  GLUCOSE 133*  --  128*  BUN 20  --  17  CREATININE 1.12 1.20 1.07  CALCIUM 8.8*  --  9.0   Liver Function Tests: Recent Labs  Lab 06/27/18 1655  AST 27  ALT 25  ALKPHOS 67  BILITOT 0.7  PROT 6.6  ALBUMIN 3.5   No results for input(s): LIPASE, AMYLASE in the last 168 hours. No results for input(s): AMMONIA in the last 168 hours. CBC: Recent Labs  Lab 06/27/18 1655 06/28/18 0054 06/28/18 0621  WBC 6.7 6.9 8.0  NEUTROABS 5.5  --   --   HGB 11.7* 12.3* 12.2*  HCT 36.9* 38.4* 37.5*  MCV 88.1 87.5 86.8  PLT 199 207 203   Cardiac Enzymes: Recent Labs  Lab 06/28/18 0054 06/28/18 0621  TROPONINI 0.13* 0.14*   BNP (last 3 results) Recent Labs    06/28/18 0054  BNP 545.9*    ProBNP (last 3 results) No results for input(s): PROBNP in the last 8760 hours.  CBG: No results for input(s): GLUCAP in the last 168 hours.  No results found for this or any previous visit (from the past 240 hour(s)).   Studies: Dg Chest 2 View  Result Date: 06/27/2018 CLINICAL DATA:  Short of breath, cough, chills EXAM: CHEST - 2 VIEW COMPARISON:  02/19/2018 FINDINGS: Small nodular density  at the left apex is stable. The heart is borderline enlarged. Bibasilar opacities left greater than right are increased compatible with atelectasis superimposed upon scarring. Normal vascularity. No pneumothorax or pleural effusion. Left lateral rib deformity is chronic. IMPRESSION: Stable nodular density at the left apex. If not already performed, CT is recommended. Bibasilar atelectasis superimposed upon scarring. Electronically Signed   By: Marybelle Killings M.D.   On: 06/27/2018 18:18    Scheduled Meds: . albuterol  2.5 mg Nebulization Q4H  . aspirin EC  81 mg Oral Daily  . budesonide (PULMICORT) nebulizer solution  0.25 mg Nebulization BID  . enoxaparin (LOVENOX) injection  40 mg Subcutaneous Q24H  . famotidine  20 mg Oral BID  . levothyroxine  50 mcg Oral QAC breakfast  . methylPREDNISolone (SOLU-MEDROL) injection  40 mg Intravenous Q12H  . vitamin B-12  50 mcg Oral Daily   Continuous Infusions: . levofloxacin (LEVAQUIN) IV 500 mg (06/28/18 0142)    Principal Problem:   COPD with acute exacerbation (HCC) Active Problems:   Elevated blood pressure reading   Hypothyroidism    Time spent: 65 minutes    [facility]  If 7PM-7AM, please contact night-coverage at [email or web address], password [facility] 06/28/2018, 10:47 AM  LOS: 0 days

## 2018-06-28 NOTE — H&P (Signed)
History and Physical    Dan Freeman PIR:[id number] DOB: 02-22-1931 DOA: 06/27/2018  PCP: Maylon Peppers, MD  Patient coming from: Home.  Chief Complaint: Shortness of breath.  HPI: Dan Freeman is a 84 y.o. male with history of COPD, prostate cancer status post radiation therapy presents to the ER because of shortness of breath.  Patient states he has been chronically short of breath and also was recently treated for pneumonia a few months ago twice started worsening shortness of breath last 4 days after visiting his friend in the hospital was being treated for pneumonia.  Since then patient has been having increasing shortness of breath productive cough wheezing.  Patient had gone to his PCP and was prescribed antibiotics and prednisone despite taking which patient was still short of breath.  Some pleuritic type of chest pain also on the left side.  Denies any nausea vomiting fever or chills or diarrhea.  Yesterday morning patient had a near syncopal episode where he thought he is about to pass out from standing up suddenly but did not pass out.  At this point he decided to come to the ER.  ED Course: In the ER patient was found to be diffusely wheezing with chest x-ray did not show anything acute.  Patient was easily getting hypoxic.  On exam patient is wheezing.  Troponin point-of-care was mildly elevated.  At the time of my exam patient did not have any chest pain.  EKG shows normal sinus rhythm with RBBB and LVH with early repolarization.  Patient blood pressure is mildly elevated which is new for the patient as per the patient.  Review of Systems: As per HPI, rest all negative.   Past Medical History:  Diagnosis Date  . Arthritis   . Cancer ([place])     prostrate     radiation  . COPD (chronic obstructive pulmonary disease) ([place])   . Esophageal diverticulum    s/p esophageal resection    Past Surgical History:  Procedure Laterality Date  . ESOPHAGECTOMY    . SHOULDER ARTHROSCOPY   01/05/2012   Procedure: ARTHROSCOPY SHOULDER;  Surgeon: Sharmon Revere, MD;  Location: [facility];  Service: Orthopedics;  Laterality: Right;  acromioplasty     reports that he has never smoked. He has never used smokeless tobacco. He reports that he does not drink alcohol or use drugs.  No Known Allergies  Family History  Problem Relation Age of Onset  . Diabetes Mellitus II Sister   . Cancer Brother     Prior to Admission medications   Medication Sig Start Date End Date Taking? Authorizing Provider  albuterol (PROVENTIL HFA;VENTOLIN HFA) 108 (90 BASE) MCG/ACT inhaler Inhale 2 puffs into the lungs every 6 (six) hours as needed for shortness of breath.    Yes [provider]  aspirin EC 81 MG tablet Take 81 mg by mouth daily.   Yes [provider]  b complex vitamins tablet Take 1 tablet by mouth daily.   Yes [provider]  doxycycline (VIBRAMYCIN) 100 MG capsule Take 100 mg by mouth 2 (two) times daily. FOR 10 DAYS 06/25/18 07/04/18 Yes [provider]  etodolac (LODINE) 400 MG tablet Take 400 mg by mouth daily. 08/15/16  Yes [provider]  fluticasone furoate-vilanterol (BREO ELLIPTA) 100-25 MCG/INH AEPB Inhale 1 puff into the lungs daily. 05/24/18  Yes [provider]  Evelina Bucy BILOBA EXTRACT PO Take 1 capsule by mouth daily.    Yes [provider]  levothyroxine (SYNTHROID, LEVOTHROID) 50 MCG tablet Take 50 mcg by mouth daily before breakfast. 04/30/17  Yes [provider]  Misc Natural Products (GINSENG COMPLEX PO) Take 1 capsule by mouth daily.    Yes [provider]  predniSONE (DELTASONE) 20 MG tablet Take 40 mg by mouth daily. FOR 7 DAYS 06/25/18 07/02/18 Yes [provider]  ranitidine (ZANTAC) 150 MG tablet Take 150 mg by mouth daily after breakfast.   Yes [provider]  vitamin B-12 (CYANOCOBALAMIN) 50 MCG tablet Take 50 mcg by mouth daily.   Yes [provider]  cephALEXin  (KEFLEX) 250 MG capsule Take 1 capsule (250 mg total) by mouth 3 (three) times daily. Patient not taking: Reported on 06/27/2018 10/08/14   Junius Creamer, NP    Physical Exam: Vitals:   06/27/18 2200 06/27/18 2245 06/27/18 2325 06/27/18 2328  BP: (!) 160/88 (!) 135/100  (!) 162/89  Pulse: 84 73  88  Resp: (!) 21 (!) 24  18  Temp:    (!) 97.5 F (36.4 C)  TempSrc:    Oral  SpO2: 97% 100%  100%  Weight:      Height:   5\' 9"  (1.753 m)       Constitutional: Moderately built and nourished. Vitals:   06/27/18 2200 06/27/18 2245 06/27/18 2325 06/27/18 2328  BP: (!) 160/88 (!) 135/100  (!) 162/89  Pulse: 84 73  88  Resp: (!) 21 (!) 24  18  Temp:    (!) 97.5 F (36.4 C)  TempSrc:    Oral  SpO2: 97% 100%  100%  Weight:      Height:   5\' 9"  (1.753 m)    Eyes: Anicteric no pallor. ENMT: No discharge from the ears eyes nose or mouth. Neck: No JVD elevated no mass felt. Respiratory: Bilateral expiratory wheezing no crepitations. Cardiovascular: S1-S2 heard no murmurs appreciated. Abdomen: Soft nontender bowel sounds present. Musculoskeletal: No edema.  No joint effusion. Skin: No rash.  Skin appears warm. Neurologic: Alert awake oriented to time place and person.  Moves all extremity's. Psychiatric: Appears normal per normal affect.   Labs on Admission: I have personally reviewed following labs and imaging studies  CBC: Recent Labs  Lab 06/27/18 1655  WBC 6.7  NEUTROABS 5.5  HGB 11.7*  HCT 36.9*  MCV 88.1  PLT 619   Basic Metabolic Panel: Recent Labs  Lab 06/27/18 1655  NA 140  K 4.8  CL 111  CO2 22  GLUCOSE 133*  BUN 20  CREATININE 1.12  CALCIUM 8.8*   GFR: Estimated Creatinine Clearance: 52.3 mL/min (by C-G formula based on SCr of 1.12 mg/dL). Liver Function Tests: Recent Labs  Lab 06/27/18 1655  AST 27  ALT 25  ALKPHOS 67  BILITOT 0.7  PROT 6.6  ALBUMIN 3.5   No results for input(s): LIPASE, AMYLASE in the last 168 hours. No results for  input(s): AMMONIA in the last 168 hours. Coagulation Profile: No results for input(s): INR, PROTIME in the last 168 hours. Cardiac Enzymes: No results for input(s): CKTOTAL, CKMB, CKMBINDEX, TROPONINI in the last 168 hours. BNP (last 3 results) No results for input(s): PROBNP in the last 8760 hours. HbA1C: No results for input(s): HGBA1C in the last 72 hours. CBG: No results for input(s): GLUCAP in the last 168 hours. Lipid Profile: No results for input(s): CHOL, HDL, LDLCALC, TRIG, CHOLHDL, LDLDIRECT in the last 72 hours. Thyroid Function Tests: No results for input(s): TSH, T4TOTAL, FREET4, T3FREE, THYROIDAB  in the last 72 hours. Anemia Panel: No results for input(s): VITAMINB12, FOLATE, FERRITIN, TIBC, IRON, RETICCTPCT in the last 72 hours. Urine analysis:    Component Value Date/Time   COLORURINE STRAW (A) 06/27/2018 1845   APPEARANCEUR CLEAR 06/27/2018 1845   LABSPEC 1.014 06/27/2018 1845   PHURINE 5.0 06/27/2018 1845   GLUCOSEU NEGATIVE 06/27/2018 1845   HGBUR NEGATIVE 06/27/2018 1845   BILIRUBINUR NEGATIVE 06/27/2018 1845   KETONESUR NEGATIVE 06/27/2018 1845   PROTEINUR NEGATIVE 06/27/2018 1845   UROBILINOGEN 0.2 01/04/2012 0939   NITRITE NEGATIVE 06/27/2018 1845   LEUKOCYTESUR NEGATIVE 06/27/2018 1845   Sepsis Labs: @LABRCNTIP (procalcitonin:4,lacticidven:4) )No results found for this or any previous visit (from the past 240 hour(s)).   Radiological Exams on Admission: Dg Chest 2 View  Result Date: 06/27/2018 CLINICAL DATA:  Short of breath, cough, chills EXAM: CHEST - 2 VIEW COMPARISON:  02/19/2018 FINDINGS: Small nodular density at the left apex is stable. The heart is borderline enlarged. Bibasilar opacities left greater than right are increased compatible with atelectasis superimposed upon scarring. Normal vascularity. No pneumothorax or pleural effusion. Left lateral rib deformity is chronic. IMPRESSION: Stable nodular density at the left apex. If not already  performed, CT is recommended. Bibasilar atelectasis superimposed upon scarring. Electronically Signed   By: Marybelle Killings M.D.   On: 06/27/2018 18:18    EKG: Independently reviewed.  Normal sinus rhythm with RBBB and LVH with early repolarization.  Assessment/Plan Principal Problem:   COPD with acute exacerbation (HCC) Active Problems:   Elevated blood pressure reading   Hypothyroidism   COPD exacerbation ([place])    1. Acute COPD exacerbation -patient is obviously wheezing on exam.  Will keep patient on Solu-Medrol and Levaquin nebulizer and Pulmicort. 2. Mildly elevated troponin with some exertional symptoms and near syncopal episode -  we will cycle cardiac markers check BNP.  BNP is elevated we will check 2D echo. 3. Normocytic normochromic anemia appears to be chronic.  Will follow CBC.  Check anemia panel. 4. Hypothyroidism on Synthroid.  Patient states recent TSH was normal.   DVT prophylaxis: Lovenox. Code Status: Full code. Family Communication: Discussed with patient's family at the bedside. Disposition Plan: Home. Consults called: None. Admission status: Observation.   Rise Patience MD [facility] Pager [phone].  If 7PM-7AM, please contact night-coverage [email or web address] Password [facility]  06/28/2018, 12:43 AM   '

## 2018-06-29 DIAGNOSIS — R748 Abnormal levels of other serum enzymes: Secondary | ICD-10-CM

## 2018-06-29 DIAGNOSIS — R0602 Shortness of breath: Secondary | ICD-10-CM

## 2018-06-29 LAB — BASIC METABOLIC PANEL
Anion gap: 8 (ref 5–15)
BUN: 22 mg/dL (ref 8–23)
CHLORIDE: 105 mmol/L (ref 98–111)
CO2: 24 mmol/L (ref 22–32)
CREATININE: 1.02 mg/dL (ref 0.61–1.24)
Calcium: 9.1 mg/dL (ref 8.9–10.3)
GFR calc Af Amer: >60 mL/min (ref >60)
GFR calc non Af Amer: >60 mL/min (ref >60)
GLUCOSE: 116 mg/dL — AB (ref 70–99)
POTASSIUM: 4.7 mmol/L (ref 3.5–5.1)
SODIUM: 137 mmol/L (ref 135–145)

## 2018-06-29 LAB — CBC
HCT: 36.8 % — ABNORMAL LOW (ref 39.0–52.0)
Hemoglobin: 11.9 g/dL — ABNORMAL LOW (ref 13.0–17.0)
MCH: 27.9 pg (ref 26.0–34.0)
MCHC: 32.3 g/dL (ref 30.0–36.0)
MCV: 86.4 fL (ref 78.0–100.0)
Platelets: 187 10*3/uL (ref 150–400)
RBC: 4.26 MIL/uL (ref 4.22–5.81)
RDW: 14.1 % (ref 11.5–15.5)
WBC: 9.5 10*3/uL (ref 4.0–10.5)

## 2018-06-29 MED ORDER — GUAIFENESIN ER 600 MG PO TB12
600.0000 mg | ORAL_TABLET | Freq: Two times a day (BID) | ORAL | Status: DC
  Administered 2018-06-29 – 2018-07-01 (×5): 600 mg via ORAL
  Filled 2018-06-29 (×5): qty 1

## 2018-06-29 MED ORDER — BENZONATATE 100 MG PO CAPS
100.0000 mg | ORAL_CAPSULE | Freq: Three times a day (TID) | ORAL | Status: DC
  Administered 2018-06-29 – 2018-07-01 (×8): 100 mg via ORAL
  Filled 2018-06-29 (×8): qty 1

## 2018-06-29 MED ORDER — AMOXICILLIN-POT CLAVULANATE 875-125 MG PO TABS
1.0000 | ORAL_TABLET | Freq: Two times a day (BID) | ORAL | Status: DC
  Administered 2018-06-29 – 2018-07-01 (×5): 1 via ORAL
  Filled 2018-06-29 (×5): qty 1

## 2018-06-29 MED ORDER — PREDNISONE 20 MG PO TABS
40.0000 mg | ORAL_TABLET | Freq: Two times a day (BID) | ORAL | Status: DC
  Administered 2018-06-29 – 2018-07-01 (×6): 40 mg via ORAL
  Filled 2018-06-29 (×6): qty 2

## 2018-06-29 MED ORDER — IPRATROPIUM-ALBUTEROL 0.5-2.5 (3) MG/3ML IN SOLN
3.0000 mL | Freq: Three times a day (TID) | RESPIRATORY_TRACT | Status: DC
  Administered 2018-06-30 – 2018-07-01 (×5): 3 mL via RESPIRATORY_TRACT
  Filled 2018-06-29 (×5): qty 3

## 2018-06-29 MED ORDER — IPRATROPIUM-ALBUTEROL 0.5-2.5 (3) MG/3ML IN SOLN
3.0000 mL | Freq: Four times a day (QID) | RESPIRATORY_TRACT | Status: DC
  Administered 2018-06-29 (×3): 3 mL via RESPIRATORY_TRACT
  Filled 2018-06-29 (×2): qty 3

## 2018-06-29 MED ORDER — ALBUTEROL SULFATE (2.5 MG/3ML) 0.083% IN NEBU
2.5000 mg | INHALATION_SOLUTION | Freq: Three times a day (TID) | RESPIRATORY_TRACT | Status: DC
  Administered 2018-06-29: 2.5 mg via RESPIRATORY_TRACT
  Filled 2018-06-29: qty 3

## 2018-06-29 NOTE — Progress Notes (Signed)
06/29/2018 Patient saturation at  Rest 93%  , ambulating in  hallway saturation 94% on room air Dan Freeman.

## 2018-06-29 NOTE — Progress Notes (Signed)
[facility] Progress Note  Patient: Dan Freeman EXB:[id number]   PCP: Maylon Peppers, MD DOB: Feb 20, 1931   DOA: 06/27/2018   DOS: 06/29/2018   Date of Service: the patient was seen and examined on 06/29/2018  Subjective: Feeling better.  Still cough and shortness of breath is still present.  No nausea no vomiting.  Oral appetite is also improving.  On room air.  Brief hospital course: Pt. with PMH of COPD, prostate cancer; admitted on 06/27/2018, presented with complaint of shortness of breath, was found to have COPD exacerbation likely viral bronchitis. Currently further plan is continue nebulizer treatment.  Assessment and Plan: 1.  Acute COPD exacerbation. Still has significant wheezing on examination. Likely from viral bronchitis. Switch Solu-Medrol to p.o. steroids.  Hold antibiotics. Continue nebulizers for now. Patient still has significant wheezing as well as some evidence of bronchospasm and tachypnea on examination.  If this resolves tomorrow morning can likely discharge the patient.  2.  Elevated troponin. Hyperdynamic LV Likely secondary to demand ischemia. Echocardiogram shows EF of 70%.  Hyperdynamic LV. Patient had near syncope on admission but that is likely secondary to hypoxia. Cardiology consulted. Patient started on Coreg. Tolerating it very well for now. Monitor overnight before discharge.  3.  Normocytic normochromic anemia. H&H stable.  Outpatient work-up.  4.  Hypothyroidism. Continue Synthroid.  Diet: Cardiac diet DVT Prophylaxis: subcutaneous Heparin Advance goals of care discussion: Full code  Family Communication: family was present at bedside, at the time of interview. The pt provided permission to discuss medical plan with the family. Opportunity was given to ask question and all questions were answered satisfactorily.   Disposition:  Discharge to home tomorrow.  Consultants: cardiology  Procedures: Echocardiogram    Antibiotics: Anti-infectives (From admission, onward)   Start     Dose/Rate Route Frequency Ordered Stop   06/29/18 1000  amoxicillin-clavulanate (AUGMENTIN) 875-125 MG per tablet 1 tablet     1 tablet Oral Every 12 hours 06/29/18 0950     06/28/18 0200  levofloxacin (LEVAQUIN) IVPB 500 mg  Status:  Discontinued     500 mg 100 mL/hr over 60 Minutes Intravenous Every 24 hours 06/28/18 0101 06/29/18 0945       Objective: Physical Exam: Vitals:   06/29/18 0833 06/29/18 0838 06/29/18 1124 06/29/18 1346  BP: 129/71     Pulse: 86 86    Resp:      Temp:  98.1 F (36.7 C)    TempSrc:  Oral    SpO2:  95% 96% 96%  Weight:      Height:        Intake/Output Summary (Last 24 hours) at 06/29/2018 1746 Last data filed at 06/29/2018 0300 Gross per 24 hour  Intake 100 ml  Output -  Net 100 ml   Filed Weights   06/27/18 1641 06/28/18 1458  Weight: 85.7 kg 85.7 kg   General: Alert, Awake and Oriented to Time, Place and Person. Appear in mild distress, affect appropriate Eyes: PERRL, Conjunctiva normal ENT: Oral Mucosa clear moist. Neck: no JVD, no Abnormal Mass Or lumps Cardiovascular: S1 and S2 Present, no Murmur, Peripheral Pulses Present Respiratory: increasedrespiratory effort, Bilateral Air entry equal and Decreased, no use of accessory muscle, no Crackles, bilateral  wheezes Abdomen: Bowel Sound present, Soft and no tenderness, no hernia Skin: no redness, no Rash, no induration Extremities: no Pedal edema, no calf tenderness Neurologic: Grossly no focal neuro deficit. Bilaterally Equal motor strength  Data Reviewed: CBC: Recent Labs  Lab  06/27/18 1655 06/28/18 0054 06/28/18 0621 06/29/18 0422  WBC 6.7 6.9 8.0 9.5  NEUTROABS 5.5  --   --   --   HGB 11.7* 12.3* 12.2* 11.9*  HCT 36.9* 38.4* 37.5* 36.8*  MCV 88.1 87.5 86.8 86.4  PLT 199 207 203 875   Basic Metabolic Panel: Recent Labs  Lab 06/27/18 1655 06/28/18 0054 06/28/18 0621 06/29/18 0422  NA 140  --  140  137  K 4.8  --  4.3 4.7  CL 111  --  106 105  CO2 22  --  23 24  GLUCOSE 133*  --  128* 116*  BUN 20  --  17 22  CREATININE 1.12 1.20 1.07 1.02  CALCIUM 8.8*  --  9.0 9.1    Liver Function Tests: Recent Labs  Lab 06/27/18 1655  AST 27  ALT 25  ALKPHOS 67  BILITOT 0.7  PROT 6.6  ALBUMIN 3.5   No results for input(s): LIPASE, AMYLASE in the last 168 hours. No results for input(s): AMMONIA in the last 168 hours. Coagulation Profile: No results for input(s): INR, PROTIME in the last 168 hours. Cardiac Enzymes: Recent Labs  Lab 06/28/18 0054 06/28/18 0621 06/28/18 1253  TROPONINI 0.13* 0.14* 0.12*   BNP (last 3 results) No results for input(s): PROBNP in the last 8760 hours. CBG: No results for input(s): GLUCAP in the last 168 hours. Studies: No results found.  Scheduled Meds: . amoxicillin-clavulanate  1 tablet Oral Q12H  . aspirin EC  81 mg Oral Daily  . benzonatate  100 mg Oral TID  . budesonide (PULMICORT) nebulizer solution  0.25 mg Nebulization BID  . carvedilol  6.25 mg Oral BID WC  . enoxaparin (LOVENOX) injection  40 mg Subcutaneous Q24H  . famotidine  20 mg Oral BID  . guaiFENesin  600 mg Oral BID  . ipratropium-albuterol  3 mL Nebulization Q6H  . levothyroxine  50 mcg Oral QAC breakfast  . predniSONE  40 mg Oral BID WC   Continuous Infusions: PRN Meds: acetaminophen **OR** acetaminophen, albuterol, ondansetron **OR** ondansetron (ZOFRAN) IV  Time spent: 35 minutes  Author: Berle Mull, MD [facility] Pager: [phone] 06/29/2018 5:46 PM  If 7PM-7AM, please contact night-coverage at [email or web address], password [facility]

## 2018-06-29 NOTE — Progress Notes (Signed)
Progress Note  Patient Name: Dan Freeman Date of Encounter: 06/29/2018  Primary Cardiologist: Buford Dresser, MD   Subjective   Doing well today. Still with cough/phlegm, only time he feels lightheaded is when he coughs. Otherwise feels like he is improving.  Inpatient Medications    Scheduled Meds: . amoxicillin-clavulanate  1 tablet Oral Q12H  . aspirin EC  81 mg Oral Daily  . benzonatate  100 mg Oral TID  . budesonide (PULMICORT) nebulizer solution  0.25 mg Nebulization BID  . carvedilol  6.25 mg Oral BID WC  . enoxaparin (LOVENOX) injection  40 mg Subcutaneous Q24H  . famotidine  20 mg Oral BID  . guaiFENesin  600 mg Oral BID  . ipratropium-albuterol  3 mL Nebulization Q6H  . levothyroxine  50 mcg Oral QAC breakfast  . predniSONE  40 mg Oral BID WC   Continuous Infusions:  PRN Meds: acetaminophen **OR** acetaminophen, albuterol, ondansetron **OR** ondansetron (ZOFRAN) IV   Vital Signs    Vitals:   06/29/18 0759 06/29/18 0833 06/29/18 0838 06/29/18 1124  BP:  129/71    Pulse:  86 86   Resp:      Temp:   98.1 F (36.7 C)   TempSrc:   Oral   SpO2: 95%  95% 96%  Weight:      Height:        Intake/Output Summary (Last 24 hours) at 06/29/2018 1241 Last data filed at 06/29/2018 0300 Gross per 24 hour  Intake 200 ml  Output -  Net 200 ml   Filed Weights   06/27/18 1641 06/28/18 1458  Weight: 85.7 kg 85.7 kg    Telemetry    Sinus rhythm, 60s when asleep, 90s when awake - Personally Reviewed  ECG    SR, LVH, RBBB - Personally Reviewed  Physical Exam   GEN: No acute distress.   Neck: supple, no JVD Cardiac: regular S1 and S2, 4/6 systolic murmur that increases with Valsalve murmurs, no rubs or gallops.  Respiratory: coarse bilaterally with rhonchi and wheezign GI: Soft, nontender, non-distended. Bowel sounds normal MS: No edema; No deformity. Neuro:  Nonfocal, moves all limbs independently Psych: Normal affect   Labs     Chemistry Recent Labs  Lab 06/27/18 1655 06/28/18 0054 06/28/18 0621 06/29/18 0422  NA 140  --  140 137  K 4.8  --  4.3 4.7  CL 111  --  106 105  CO2 22  --  23 24  GLUCOSE 133*  --  128* 116*  BUN 20  --  17 22  CREATININE 1.12 1.20 1.07 1.02  CALCIUM 8.8*  --  9.0 9.1  PROT 6.6  --   --   --   ALBUMIN 3.5  --   --   --   AST 27  --   --   --   ALT 25  --   --   --   ALKPHOS 67  --   --   --   BILITOT 0.7  --   --   --   GFRNONAA 58* 53* >60 >60  GFRAA >60 >60 >60 >60  ANIONGAP 7  --  11 8     Hematology Recent Labs  Lab 06/28/18 0054 06/28/18 0621 06/29/18 0422  WBC 6.9 8.0 9.5  RBC 4.39 4.32  4.32 4.26  HGB 12.3* 12.2* 11.9*  HCT 38.4* 37.5* 36.8*  MCV 87.5 86.8 86.4  MCH 28.0 28.2 27.9  MCHC 32.0 32.5 32.3  RDW 14.0 13.8 14.1  PLT 207 203 187    Cardiac Enzymes Recent Labs  Lab 06/28/18 0054 06/28/18 0621 06/28/18 1253  TROPONINI 0.13* 0.14* 0.12*    Recent Labs  Lab 06/27/18 2223  TROPIPOC 0.09*     BNP Recent Labs  Lab 06/28/18 0054  BNP 545.9*     DDimer No results for input(s): DDIMER in the last 168 hours.   Radiology    Dg Chest 2 View  Result Date: 06/27/2018 CLINICAL DATA:  Short of breath, cough, chills EXAM: CHEST - 2 VIEW COMPARISON:  02/19/2018 FINDINGS: Small nodular density at the left apex is stable. The heart is borderline enlarged. Bibasilar opacities left greater than right are increased compatible with atelectasis superimposed upon scarring. Normal vascularity. No pneumothorax or pleural effusion. Left lateral rib deformity is chronic. IMPRESSION: Stable nodular density at the left apex. If not already performed, CT is recommended. Bibasilar atelectasis superimposed upon scarring. Electronically Signed   By: Marybelle Killings M.D.   On: 06/27/2018 18:18    Cardiac Studies   Echo 06/28/18 Study Conclusions  - Left ventricle: The cavity size was normal. There was moderate   concentric hypertrophy. Systolic function was  hyperdynamic. The   estimated ejection fraction was in the range of 75% to 80%. Wall   motion was normal; there were no regional wall motion   abnormalities. - Mitral valve: There was probable severe systolic anterior motion   of the anterior leaflet. There was mild regurgitation. - Left atrium: The atrium was mildly dilated.  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 Images are technically difficult   and cannot entirely exclude a component of valvular aortic   stenosis.  Recommendations:  1. Consider transesophageal echocardiography if clinically    indicated to clarify mechanism of LV outflow tract stenosis. 2. Possible hypertrophic obstructive cardiomyopathy.  Patient Profile     84 y.o. male with a hx of COPD, h/o elevated BP, chronic normocytic normochromic anemia, h/o prostate CA s/p radiation therapy, s/p esophageal surgery 2/2 esophageal diverticulum, arthritis and hypothyroidism on synthroid who is being seen today for the evaluation of SOB and elevated troponin in the setting of COPD exacerbation and at the request of Dr. Hal Hope.  Assessment & Plan    Hypertrophic obstructive cardiomyopathy: -LVH on ECG, hypertrophy and dynamic LVOT gradient on echo. Difficult to fully assess images, but may also have SAM.  -His echo and near syncope suggest a dynamic LVOT obstruction due to hypertrophy. No family history of it that he is aware of, and he states he's never needed treatment for high blood pressure. Could also be infiltrative, though less typical in that appearance. -He is not a candidate for TEE given history of prior esophageal surgery and diverticulum. -started carvedilol given BP and Hr, tolerating well. Also counseled on avoiding dehydration, if he drinks caffeine should also drink water. -coughing may cause him to have transient LVOT obstruction. Advise cough suppressants.  Troponin elevation  is flat, no chest pain (has rib tenderness with cough, reproducible). Normal wall motion. Not consistent with ACS.  SOB: likely pulmonary in nature, recent pneumonia, history of bronchitis  Time Spent Directly with Patient: I have spent a total of 25 minutes with the patient reviewing hospital notes, telemetry, EKGs, labs and examining the patient as well as establishing an assessment and plan that was discussed personally with the patient.  > 50% of time was spent in  direct patient care.  Length of Stay:  LOS: 1 day   Buford Dresser, MD, PhD [facility]  [facility]   06/29/2018, 12:41 PM      For questions or updates, please contact [place] Please consult [email or web address] for contact info under Cardiology/STEMI.

## 2018-06-30 MED ORDER — TIOTROPIUM BROMIDE MONOHYDRATE 18 MCG IN CAPS: 18.0000 ug | ORAL_CAPSULE | Freq: Every day | RESPIRATORY_TRACT | 2 refills | Status: DC

## 2018-06-30 MED ORDER — CARVEDILOL 6.25 MG PO TABS: 6.2500 mg | ORAL_TABLET | Freq: Two times a day (BID) | ORAL | 0 refills | Status: DC

## 2018-06-30 MED ORDER — AMOXICILLIN-POT CLAVULANATE 875-125 MG PO TABS: 1.0000 | ORAL_TABLET | Freq: Two times a day (BID) | ORAL | 0 refills | Status: AC

## 2018-06-30 MED ORDER — FAMOTIDINE 20 MG PO TABS: 20.0000 mg | ORAL_TABLET | Freq: Two times a day (BID) | ORAL | 0 refills | Status: DC

## 2018-06-30 MED ORDER — PREDNISONE 10 MG PO TABS: ORAL_TABLET | ORAL | 0 refills | Status: DC

## 2018-06-30 MED ORDER — BENZONATATE 100 MG PO CAPS: 100.0000 mg | ORAL_CAPSULE | Freq: Three times a day (TID) | ORAL | 0 refills | Status: DC

## 2018-06-30 MED ORDER — FAMOTIDINE 20 MG PO TABS: 20.0000 mg | ORAL_TABLET | Freq: Every day | ORAL | 0 refills | Status: DC

## 2018-06-30 MED ORDER — DM-GUAIFENESIN ER 30-600 MG PO TB12: 1.0000 | ORAL_TABLET | Freq: Two times a day (BID) | ORAL | 0 refills | Status: AC

## 2018-06-30 NOTE — Progress Notes (Signed)
Brief cardiology follow up note.  Patient tolerating carvedilol. We will arrange follow up for him in outpatient clinic.  [facility] will sign off in anticipation of discharge.   Medication Recommendations:  Carvedilol 6.25 mg twice daily. Other recommendations (labs, testing, etc):  None  Follow up as an outpatient:  We will arrange for outpatient follow up with cardiology.  Buford Dresser, MD, PhD [facility]  [address], [place], [place] [address] [phone]

## 2018-06-30 NOTE — Progress Notes (Signed)
[facility] Progress Note  Patient: Dan Freeman ZOX:[id number]   PCP: Maylon Peppers, MD DOB: [birth year]   DOA: 06/27/2018   DOS: 06/30/2018   Date of Service: the patient was seen and examined on 06/30/2018  Subjective: Feeling better.  No more dizziness.  No acute events overnight.  Later in the afternoon patient reports that he is having more shortness of breath and has worsening wheezing.  No chest pain.  Again no dizziness on my evaluation.  Brief hospital course: Pt. with PMH of COPD, prostate cancer; admitted on 06/27/2018, presented with complaint of shortness of breath, was found to have COPD exacerbation likely viral bronchitis. Currently further plan is continue nebulizer treatment.  Assessment and Plan: 1.  Acute COPD exacerbation. Still has significant wheezing on examination. Likely from viral bronchitis. Switch Solu-Medrol to p.o. steroids.  Hold antibiotics. Continue nebulizers for now. Patient was breathing better this morning.  Later on in the afternoon still has significant wheezing as well as tachypnea on examination.  Continue nebulizers.  Monitor. If this resolves tomorrow morning can likely discharge the patient.  2.  Elevated troponin. Hyperdynamic LV Likely secondary to demand ischemia. Echocardiogram shows EF of 70%.  Hyperdynamic LV. Patient had near syncope on admission but that is likely secondary to hypoxia. Cardiology consulted. Patient started on Coreg. Tolerating it very well for now.  3.  Normocytic normochromic anemia. H&H stable.  Outpatient work-up.  4.  Hypothyroidism. Continue Synthroid.  Diet: Cardiac diet DVT Prophylaxis: subcutaneous Heparin Advance goals of care discussion: Full code  Family Communication: family was present at bedside, at the time of interview. The pt provided permission to discuss medical plan with the family. Opportunity was given to ask question and all questions were answered satisfactorily.    Disposition:  Discharge to home tomorrow.  Consultants: cardiology  Procedures: Echocardiogram   Antibiotics: Anti-infectives (From admission, onward)   Start     Dose/Rate Route Frequency Ordered Stop   06/30/18 0000  amoxicillin-clavulanate (AUGMENTIN) 875-125 MG tablet     1 tablet Oral 2 times daily 06/30/18 0737 07/03/18 2359   06/29/18 1000  amoxicillin-clavulanate (AUGMENTIN) 875-125 MG per tablet 1 tablet     1 tablet Oral Every 12 hours 06/29/18 0950     06/28/18 0200  levofloxacin (LEVAQUIN) IVPB 500 mg  Status:  Discontinued     500 mg 100 mL/hr over 60 Minutes Intravenous Every 24 hours 06/28/18 0101 06/29/18 0945       Objective: Physical Exam: Vitals:   06/30/18 0836 06/30/18 1128 06/30/18 1330 06/30/18 1601  BP:    128/63  Pulse:    82  Resp:    18  Temp:    97.8 F (36.6 C)  TempSrc:    Oral  SpO2: 94% 94% 95% 95%  Weight:      Height:        Intake/Output Summary (Last 24 hours) at 06/30/2018 1733 Last data filed at 06/30/2018 1608 Gross per 24 hour  Intake 360 ml  Output 501 ml  Net -141 ml   Filed Weights   06/27/18 1641 06/28/18 1458  Weight: 85.7 kg 85.7 kg   General: Alert, Awake and Oriented to Time, Place and Person. Appear in mild distress, affect appropriate Eyes: PERRL, Conjunctiva normal ENT: Oral Mucosa clear moist. Neck: no JVD, no Abnormal Mass Or lumps Cardiovascular: S1 and S2 Present, no Murmur, Peripheral Pulses Present Respiratory: increasedrespiratory effort, Bilateral Air entry equal and Decreased, no use of accessory muscle, no Crackles,  bilateral  wheezes Abdomen: Bowel Sound present, Soft and no tenderness, no hernia Skin: no redness, no Rash, no induration Extremities: no Pedal edema, no calf tenderness Neurologic: Grossly no focal neuro deficit. Bilaterally Equal motor strength  Data Reviewed: CBC: Recent Labs  Lab 06/27/18 1655 06/28/18 0054 06/28/18 0621 06/29/18 0422  WBC 6.7 6.9 8.0 9.5  NEUTROABS 5.5   --   --   --   HGB 11.7* 12.3* 12.2* 11.9*  HCT 36.9* 38.4* 37.5* 36.8*  MCV 88.1 87.5 86.8 86.4  PLT 199 207 203 449   Basic Metabolic Panel: Recent Labs  Lab 06/27/18 1655 06/28/18 0054 06/28/18 0621 06/29/18 0422  NA 140  --  140 137  K 4.8  --  4.3 4.7  CL 111  --  106 105  CO2 22  --  23 24  GLUCOSE 133*  --  128* 116*  BUN 20  --  17 22  CREATININE 1.12 1.20 1.07 1.02  CALCIUM 8.8*  --  9.0 9.1    Liver Function Tests: Recent Labs  Lab 06/27/18 1655  AST 27  ALT 25  ALKPHOS 67  BILITOT 0.7  PROT 6.6  ALBUMIN 3.5   No results for input(s): LIPASE, AMYLASE in the last 168 hours. No results for input(s): AMMONIA in the last 168 hours. Coagulation Profile: No results for input(s): INR, PROTIME in the last 168 hours. Cardiac Enzymes: Recent Labs  Lab 06/28/18 0054 06/28/18 0621 06/28/18 1253  TROPONINI 0.13* 0.14* 0.12*   BNP (last 3 results) No results for input(s): PROBNP in the last 8760 hours. CBG: No results for input(s): GLUCAP in the last 168 hours. Studies: No results found.  Scheduled Meds: . amoxicillin-clavulanate  1 tablet Oral Q12H  . aspirin EC  81 mg Oral Daily  . benzonatate  100 mg Oral TID  . budesonide (PULMICORT) nebulizer solution  0.25 mg Nebulization BID  . carvedilol  6.25 mg Oral BID WC  . enoxaparin (LOVENOX) injection  40 mg Subcutaneous Q24H  . famotidine  20 mg Oral BID  . guaiFENesin  600 mg Oral BID  . ipratropium-albuterol  3 mL Nebulization TID  . levothyroxine  50 mcg Oral QAC breakfast  . predniSONE  40 mg Oral BID WC   Continuous Infusions: PRN Meds: acetaminophen **OR** acetaminophen, albuterol, ondansetron **OR** ondansetron (ZOFRAN) IV  Time spent: 35 minutes  Author: Berle Mull, MD [facility] Pager: [phone] 06/30/2018 5:33 PM  If 7PM-7AM, please contact night-coverage at [email or web address], password [facility]

## 2018-06-30 NOTE — Progress Notes (Signed)
Patient's daughter questioned regarding discharge, daughter and patient feel patient is probably not ready to go home today.  The plan had been to try to discharge to day as patient had church and a family reunion.  Patient unsteady on the way to the bathroom.  Patient assisted back to bed.  Lung sounds are wheezy throughout. Respiratory paged for prn Albuterol and bed alarm on. Patient instructed to call if he needs to get up.  Family aware of safety method in place.

## 2018-07-01 MED ORDER — GUAIFENESIN ER 600 MG PO TB12: 600.0000 mg | ORAL_TABLET | Freq: Two times a day (BID) | ORAL | 0 refills | Status: DC

## 2018-07-01 NOTE — Evaluation (Signed)
Physical Therapy Evaluation Patient Details Name: Dan Freeman MRN: [id number] DOB: [birth year] Today's Date: 07/01/2018   History of Present Illness  Pt. with PMH of COPD, prostate cancer; admitted on 06/27/2018, presented with complaint of shortness of breath, was found to have COPD exacerbation likely viral bronchitis.  Clinical Impression  Patient presents with decreased independence with mobility due to weakness and prolonged bedrest with acute illness.  Currently S to modified independent level with mobility and will have family assist at d/c.  No further skilled PT needs at this time.  Patient educated on slow return to activity and slowly rising due to positive orthostatic BP.  Orthostatic VS for the past 24 hrs (Last 3 readings):  BP- Lying Pulse- Lying BP- Sitting Pulse- Sitting BP- Standing at 0 minutes Pulse- Standing at 0 minutes BP- Standing at 3 minutes Pulse- Standing at 3 minutes  07/01/18 0900 110/64 77 135/68 82 (!) 89/56 87 108/61 89      Follow Up Recommendations No PT follow up;Supervision - Intermittent    Equipment Recommendations  None recommended by PT    Recommendations for Other Services       Precautions / Restrictions Precautions Precautions: Fall      Mobility  Bed Mobility Overal bed mobility: Modified Independent                Transfers Overall transfer level: Modified independent Equipment used: None             General transfer comment: no physical help and no assistive devices needed  Ambulation/Gait Ambulation/Gait assistance: Supervision Gait Distance (Feet): 250 Feet Assistive device: None Gait Pattern/deviations: Step-through pattern;Decreased stride length;Wide base of support     General Gait Details: mildly increased BOS with some evidence for imbalance/unsteadiness with ambulation.  SpO2 on RA remained 90-91% throughout, though pt reported SOB after ambulation  Stairs            Wheelchair Mobility     Modified Rankin (Stroke Patients Only)       Balance Overall balance assessment: Mild deficits observed, not formally tested                                           Pertinent Vitals/Pain Pain Assessment: Faces Faces Pain Scale: Hurts a little bit Pain Location: sides from coughing Pain Descriptors / Indicators: Sore Pain Intervention(s): Repositioned;Monitored during session    Home Living Family/patient expects to be discharged to:: Private residence Living Arrangements: Spouse/significant other Available Help at Discharge: Family Type of Home: House(townhouse) Home Access: Level entry     Home Layout: One level Home Equipment: None;Grab bars - tub/shower      Prior Function Level of Independence: Independent               Hand Dominance        Extremity/Trunk Assessment   Upper Extremity Assessment Upper Extremity Assessment: Overall WFL for tasks assessed    Lower Extremity Assessment Lower Extremity Assessment: Overall WFL for tasks assessed       Communication   Communication: No difficulties  Cognition Arousal/Alertness: Awake/alert Behavior During Therapy: WFL for tasks assessed/performed Overall Cognitive Status: Within Functional Limits for tasks assessed  General Comments General comments (skin integrity, edema, etc.): stood for orthostatic testing without LOB no UE assist; educated pt on slow to rise and need for rest breaks and relying on family to assist with IADL's for couple of weeks till he is back to 100%    Exercises     Assessment/Plan    PT Assessment Patent does not need any further PT services  PT Problem List         PT Treatment Interventions      PT Goals (Current goals can be found in the Care Plan section)  Acute Rehab PT Goals PT Goal Formulation: All assessment and education complete, DC therapy    Frequency     Barriers to  discharge        Co-evaluation               AM-PAC PT "6 Clicks" Daily Activity  Outcome Measure Difficulty turning over in bed (including adjusting bedclothes, sheets and blankets)?: None Difficulty moving from lying on back to sitting on the side of the bed? : None Difficulty sitting down on and standing up from a chair with arms (e.g., wheelchair, bedside commode, etc,.)?: None Help needed moving to and from a bed to chair (including a wheelchair)?: A Little Help needed walking in hospital room?: A Little Help needed climbing 3-5 steps with a railing? : A Little 6 Click Score: 21    End of Session Equipment Utilized During Treatment: Gait belt Activity Tolerance: Patient tolerated treatment well Patient left: with call bell/phone within reach;in chair   PT Visit Diagnosis: Muscle weakness (generalized) (M62.81)    Time: 3846-6599 PT Time Calculation (min) (ACUTE ONLY): 26 min   Charges:   PT Evaluation $PT Eval Low Complexity: 1 Low PT Treatments $Gait Training: 8-22 mins        Magda Kiel, [employer] [phone] 07/01/2018   Reginia Naas 07/01/2018, 10:47 AM

## 2018-07-02 ENCOUNTER — Telehealth: Payer: Self-pay | Admitting: *Deleted

## 2018-07-02 NOTE — Telephone Encounter (Signed)
Left message for patient to call and schedule 4-8 week post hospital visit with Dr. Harrell Gave or APP

## 2018-07-05 NOTE — Discharge Summary (Signed)
[facility] Discharge Summary   Patient: Dan Freeman QAS:[id number]   PCP: Maylon Peppers, MD DOB: 1931/08/29   Date of admission: 06/27/2018   Date of discharge: 07/01/2018     Discharge Diagnoses:  Principal Problem:   COPD with acute exacerbation ([place]) Active Problems:   High blood pressure   Hypothyroidism   Left ventricular outflow tract obstruction   HOCM (hypertrophic obstructive cardiomyopathy) ([place])   Pneumonia   Admitted From: home Disposition:  home  Recommendations for Outpatient Follow-up:  1. Please follow up with PCP in 1 week   Follow-up Information    Maylon Peppers, MD.   Specialty:  [facility] Medicine Contact information: [address] [place] [address] [phone]        Buford Dresser, MD. Schedule an appointment as soon as possible for a visit in 1 month(s).   Specialty:  Cardiology Contact information: [address] [place] [address] [place] [address] [phone]          Diet recommendation: cardiac diet  Activity: The patient is advised to gradually reintroduce usual activities.  Discharge Condition: good  Code Status: full code  History of present illness: As per the H and P dictated on admission, "Dan Freeman is a 84 y.o. male with history of COPD, prostate cancer status post radiation therapy presents to the ER because of shortness of breath.  Patient states he has been chronically short of breath and also was recently treated for pneumonia a few months ago twice started worsening shortness of breath last 4 days after visiting his friend in the hospital was being treated for pneumonia.  Since then patient has been having increasing shortness of breath productive cough wheezing.  Patient had gone to his PCP and was prescribed antibiotics and prednisone despite taking which patient was still short of breath.  Some pleuritic type of chest pain also on the left side.  Denies any nausea vomiting fever or  chills or diarrhea.  Yesterday morning patient had a near syncopal episode where he thought he is about to pass out from standing up suddenly but did not pass out.  At this point he decided to come to the ER."  Hospital Course:  Summary of his active problems in the hospital is as following. 1.  Acute COPD exacerbation. Likely from viral bronchitis. Switch Solu-Medrol to p.o. steroids.  Hold antibiotics. Felt better, O2 remained stable on room air  2.  Elevated troponin. Hyperdynamic LV Likely secondary to demand ischemia. Echocardiogram shows EF of 70%.  Hyperdynamic LV. Patient had near syncope on admission but that is likely secondary to hypoxia. Cardiology consulted. Patient started on Coreg. Tolerating it very well for now.  3.  Normocytic normochromic anemia. H&H stable.  Outpatient work-up.  4.  Hypothyroidism. Continue Synthroid.  All other chronic medical condition were stable during the hospitalization.  Patient was ambulatory without any assistance. On the day of the discharge the patient's vitals were stable, and no other acute medical condition were reported by patient. the patient was felt safe to be discharge at home with family.  Consultants: cardiology  Procedures: Echocardiogram   DISCHARGE MEDICATION: Allergies as of 07/01/2018   No Known Allergies     Medication List    STOP taking these medications   cephALEXin 250 MG capsule Commonly known as:  KEFLEX   doxycycline 100 MG capsule Commonly known as:  VIBRAMYCIN   ranitidine 150 MG tablet Commonly known as:  ZANTAC  TAKE these medications   albuterol 108 (90 Base) MCG/ACT inhaler Commonly known as:  PROVENTIL HFA;VENTOLIN HFA Inhale 2 puffs into the lungs every 6 (six) hours as needed for shortness of breath.   aspirin EC 81 MG tablet Take 81 mg by mouth daily.   b complex vitamins tablet Take 1 tablet by mouth daily.   benzonatate 100 MG capsule Commonly known as:  TESSALON Take  1 capsule (100 mg total) by mouth 3 (three) times daily.   carvedilol 6.25 MG tablet Commonly known as:  COREG Take 1 tablet (6.25 mg total) by mouth 2 (two) times daily with a meal.   dextromethorphan-guaiFENesin 30-600 MG 12hr tablet Commonly known as:  MUCINEX DM Take 1 tablet by mouth 2 (two) times daily for 7 days.   etodolac 400 MG tablet Commonly known as:  LODINE Take 400 mg by mouth daily.   famotidine 20 MG tablet Commonly known as:  PEPCID Take 1 tablet (20 mg total) by mouth daily.   fluticasone furoate-vilanterol 100-25 MCG/INH Aepb Commonly known as:  BREO ELLIPTA Inhale 1 puff into the lungs daily.   GINKGO BILOBA EXTRACT PO Take 1 capsule by mouth daily.   GINSENG COMPLEX PO Take 1 capsule by mouth daily.   levothyroxine 50 MCG tablet Commonly known as:  SYNTHROID, LEVOTHROID Take 50 mcg by mouth daily before breakfast.   predniSONE 10 MG tablet Commonly known as:  DELTASONE Take 50mg  daily for 3days,Take 40mg  daily for 3days,Take 30mg  daily for 3days,Take 20mg  daily for 3days,Take 10mg  daily for 3days, then stop What changed:    medication strength  how much to take  how to take this  when to take this  additional instructions   tiotropium 18 MCG inhalation capsule Commonly known as:  SPIRIVA Place 1 capsule (18 mcg total) into inhaler and inhale daily.   vitamin B-12 50 MCG tablet Commonly known as:  CYANOCOBALAMIN Take 50 mcg by mouth daily.     ASK your doctor about these medications   amoxicillin-clavulanate 875-125 MG tablet Commonly known as:  AUGMENTIN Take 1 tablet by mouth 2 (two) times daily for 3 days. Ask about: Should I take this medication?      No Known Allergies Discharge Instructions    Diet - low sodium heart healthy   Complete by:  As directed    Discharge instructions   Complete by:  As directed    It is important that you read following instructions as well as go over your medication list with RN to help you  understand your care after this hospitalization.  Discharge Instructions: Please follow-up with PCP in one week  Please request your primary care physician to go over all Hospital Tests and Procedure/Radiological results at the follow up,  Please get all Hospital records sent to your PCP by signing hospital release before you go home.   Do not take more than prescribed Pain, Sleep and Anxiety Medications. You were cared for by a hospitalist during your hospital stay. If you have any questions about your discharge medications or the care you received while you were in the hospital after you are discharged, you can call the unit and ask to speak with the hospitalist on call if the hospitalist that took care of you is not available.  Once you are discharged, your primary care physician will handle any further medical issues. Please note that NO REFILLS for any discharge medications will be authorized once you are discharged, as it is imperative that  you return to your primary care physician (or establish a relationship with a primary care physician if you do not have one) for your aftercare needs so that they can reassess your need for medications and monitor your lab values. You Must read complete instructions/literature along with all the possible adverse reactions/side effects for all the Medicines you take and that have been prescribed to you. Take any new Medicines after you have completely understood and accept all the possible adverse reactions/side effects. Wear Seat belts while driving. If you have smoked or chewed Tobacco in the last 2 yrs please stop smoking and/or stop any Recreational drug use.   Driving Restrictions   Complete by:  As directed    Do not drive, operating heavy machinery, perform activities at heights, swimming or participation in water activities or provide baby sitting services since your were admitted for syncope; until you have been seen by Primary Care Physician or a  cardiologist and advised to do so again.   Increase activity slowly   Complete by:  As directed      Discharge Exam: Filed Weights   06/27/18 1641 06/28/18 1458  Weight: 85.7 kg 85.7 kg   Vitals:   07/01/18 0834 07/01/18 1417  BP: 134/72   Pulse: 87   Resp: 14   Temp: 98.2 F (36.8 C)   SpO2: 98% 95%   General: Appear in no distress, no Rash; Oral Mucosa moist. Cardiovascular: S1 and S2 Present, no Murmur, no JVD Respiratory: Bilateral Air entry present and no Crackles, Occasional wheezes Abdomen: Bowel Sound present, Soft and no tenderness Extremities: no Pedal edema, no calf tenderness Neurology: Grossly no focal neuro deficit.  The results of significant diagnostics from this hospitalization (including imaging, microbiology, ancillary and laboratory) are listed below for reference.    Significant Diagnostic Studies: Dg Chest 2 View  Result Date: 06/27/2018 CLINICAL DATA:  Short of breath, cough, chills EXAM: CHEST - 2 VIEW COMPARISON:  02/19/2018 FINDINGS: Small nodular density at the left apex is stable. The heart is borderline enlarged. Bibasilar opacities left greater than right are increased compatible with atelectasis superimposed upon scarring. Normal vascularity. No pneumothorax or pleural effusion. Left lateral rib deformity is chronic. IMPRESSION: Stable nodular density at the left apex. If not already performed, CT is recommended. Bibasilar atelectasis superimposed upon scarring. Electronically Signed   By: Marybelle Killings M.D.   On: 06/27/2018 18:18    Microbiology: No results found for this or any previous visit (from the past 240 hour(s)).   Labs: CBC: Recent Labs  Lab 06/28/18 0621 06/29/18 0422  WBC 8.0 9.5  HGB 12.2* 11.9*  HCT 37.5* 36.8*  MCV 86.8 86.4  PLT 203 824   Basic Metabolic Panel: Recent Labs  Lab 06/28/18 0621 06/29/18 0422  NA 140 137  K 4.3 4.7  CL 106 105  CO2 23 24  GLUCOSE 128* 116*  BUN 17 22  CREATININE 1.07 1.02  CALCIUM  9.0 9.1   Liver Function Tests: No results for input(s): AST, ALT, ALKPHOS, BILITOT, PROT, ALBUMIN in the last 168 hours. No results for input(s): LIPASE, AMYLASE in the last 168 hours. No results for input(s): AMMONIA in the last 168 hours. Cardiac Enzymes: Recent Labs  Lab 06/28/18 0621 06/28/18 1253  TROPONINI 0.14* 0.12*   BNP (last 3 results) Recent Labs    06/28/18 0054  BNP 545.9*   CBG: No results for input(s): GLUCAP in the last 168 hours. Time spent: 35 minutes  Signed:  Diamantina Providence  Patel  [facility] 07/01/2018   , 12:59 AM

## 2018-08-06 ENCOUNTER — Ambulatory Visit (INDEPENDENT_AMBULATORY_CARE_PROVIDER_SITE_OTHER): Payer: Medicare Other | Admitting: Cardiology

## 2018-08-06 ENCOUNTER — Encounter: Payer: Self-pay | Admitting: Cardiology

## 2018-08-06 VITALS — BP 102/54 | HR 88 | Ht 69.0 in | Wt 180.8 lb

## 2018-08-06 DIAGNOSIS — Q248 Other specified congenital malformations of heart: Secondary | ICD-10-CM

## 2018-08-06 DIAGNOSIS — J449 Chronic obstructive pulmonary disease, unspecified: Secondary | ICD-10-CM | POA: Diagnosis not present

## 2018-08-06 DIAGNOSIS — I421 Obstructive hypertrophic cardiomyopathy: Secondary | ICD-10-CM

## 2018-08-06 NOTE — Progress Notes (Signed)
Cardiology Office Note:    Date:  08/06/2018   ID:  Dan Freeman, DOB 13-Mar-1931, MRN [id number]  PCP:  Maylon Peppers, MD  Cardiologist:  Buford Dresser, MD PhD  Referring MD: Maylon Peppers, MD   CC: post hospitalization  History of Present Illness:    Dan Freeman is a 84 y.o. male with a hx of hypertension, COPD, prostate cancer s/p radiation, s/p esophagectomy 2/2 diverticulum, hypothyroidism who is seen as a new consult at the request of Maylon Peppers, MD for the evaluation and management of hypertrophic obstructive cardiomyopathy. He was recently admitted 9/12-9/16/19 for shortness of breath (in the context of recent pneumonia, on inhalers/antibiotics/steroids) and mildly elevated/flat troponin. He did have near syncope with a coughing spell, thought to be 2/2 dynamic outflow obstruction from HOCM.   No PND, orthopnea, LE edema. Not weighing himself at home. Taking carvedilol, aspirin, levothyroxine, etodolac routinely. Still with intermittent dry cough but improving. No more syncope. Still with some shortness of breath.  We again reviewed his echo findings and recommendations for management. All questions answered.  Past Medical History:  Diagnosis Date  . Arthritis   . Cancer ([place])     prostrate     radiation  . COPD (chronic obstructive pulmonary disease) ([place])   . Esophageal diverticulum    s/p esophageal resection  . Essential hypertension   . HOCM (hypertrophic obstructive cardiomyopathy) ([place])    Not congenital, likely 2/2 uncontrolled HBP  . Holosystolic murmur    11/03/12 echo shows LVOT stenosis consistent w/ HOCM and likely 2/2 uncontrolled HBP     Past Surgical History:  Procedure Laterality Date  . ESOPHAGECTOMY    . SHOULDER ARTHROSCOPY  01/05/2012   Procedure: ARTHROSCOPY SHOULDER;  Surgeon: Sharmon Revere, MD;  Location: [facility];  Service: Orthopedics;  Laterality: Right;  acromioplasty    Current Medications: Current Outpatient Medications  on File Prior to Visit  Medication Sig  . albuterol (PROVENTIL HFA;VENTOLIN HFA) 108 (90 BASE) MCG/ACT inhaler Inhale 2 puffs into the lungs every 6 (six) hours as needed for shortness of breath.   Marland Kitchen aspirin EC 81 MG tablet Take 81 mg by mouth daily.  Marland Kitchen b complex vitamins tablet Take 1 tablet by mouth daily.  . benzonatate (TESSALON) 100 MG capsule Take 1 capsule (100 mg total) by mouth 3 (three) times daily.  . carvedilol (COREG) 6.25 MG tablet Take 1 tablet (6.25 mg total) by mouth 2 (two) times daily with a meal.  . etodolac (LODINE) 400 MG tablet Take 400 mg by mouth daily.  . famotidine (PEPCID) 20 MG tablet Take 1 tablet (20 mg total) by mouth daily.  . fluticasone furoate-vilanterol (BREO ELLIPTA) 100-25 MCG/INH AEPB Inhale 1 puff into the lungs daily.  Marland Kitchen GINKGO BILOBA EXTRACT PO Take 1 capsule by mouth daily.   Marland Kitchen levothyroxine (SYNTHROID, LEVOTHROID) 50 MCG tablet Take 50 mcg by mouth daily before breakfast.  . Misc Natural Products (GINSENG COMPLEX PO) Take 1 capsule by mouth daily.   . predniSONE (DELTASONE) 10 MG tablet Take 50mg  daily for 3days,Take 40mg  daily for 3days,Take 30mg  daily for 3days,Take 20mg  daily for 3days,Take 10mg  daily for 3days, then stop  . tiotropium (SPIRIVA HANDIHALER) 18 MCG inhalation capsule Place 1 capsule (18 mcg total) into inhaler and inhale daily.  . vitamin B-12 (CYANOCOBALAMIN) 50 MCG tablet Take 50 mcg by mouth daily.   No current facility-administered medications on file prior to visit.      Allergies:  Patient has no known allergies.   Social History   Socioeconomic History  . Marital status: Married    Spouse name: Not on file  . Number of children: Not on file  . Years of education: Not on file  . Highest education level: Not on file  Occupational History  . Not on file  Social Needs  . Financial resource strain: Somewhat hard  . Food insecurity:    Worry: Never true    Inability: Never true  . Transportation needs:    Medical:  No    Non-medical: No  Tobacco Use  . Smoking status: Never Smoker  . Smokeless tobacco: Never Used  Substance and Sexual Activity  . Alcohol use: No  . Drug use: No  . Sexual activity: Not on file  Lifestyle  . Physical activity:    Days per week: 7 days    Minutes per session: 80 min  . Stress: Not at all  Relationships  . Social connections:    Talks on phone: More than three times a week    Gets together: More than three times a week    Attends religious service: More than 4 times per year    Active member of club or organization: Yes    Attends meetings of clubs or organizations: More than 4 times per year    Relationship status: Married  Other Topics Concern  . Not on file  Social History Narrative  . Not on file     Family History: The patient's family history includes Cancer in his brother; Diabetes Mellitus II in his sister.  ROS:   Please see the history of present illness.  Additional pertinent ROS:  Constitutional: Negative for chills, fever, night sweats, unintentional weight loss  HENT: Negative for ear pain and hearing loss.   Eyes: Negative for loss of vision and eye pain.  Respiratory: Positive for shortness of breath, dry cough. Negative for sputum, wheezing.   Cardiovascular: Negative for chest pain, palpitations, PND, orthopnea, lower extremity edema and claudication.  Gastrointestinal: Negative for abdominal pain, melena, and hematochezia.  Genitourinary: Negative for dysuria and hematuria.  Musculoskeletal: Negative for falls and myalgias.  Skin: Negative for itching and rash.  Neurological: Negative for focal weakness, focal sensory changes and loss of consciousness.  Endo/Heme/Allergies: Does not bruise/bleed easily.    EKGs/Labs/Other Studies Reviewed:    The following studies were reviewed today: 06/28/18 TTE Study Conclusions - Left ventricle: The cavity size was normal. There was moderate concentric hypertrophy. Systolic function was  hyperdynamic. The estimated ejection fraction was in the range of 75% to 80%. Wall motion was normal; there were no regional wall motion abnormalities. - Mitral valve: There was probable severe systolic anterior motion of the anterior leaflet. There was mild regurgitation. - Left atrium: The atrium was mildly dilated. Impressions: - There is moderate aortic outflow stenosis, which appears to be mostly a subvalvular problem. Images are technically difficult and cannot entirely exclude a component of valvular aortic stenosis. Recommendations: 1. Consider transesophageal echocardiography if clinically indicated to clarify mechanism of LV outflow tract stenosis. 2. Possible hypertrophic obstructive cardiomyopathy.  EKG:  The ekg ordered previously demonstrates SR with PVC, RBBB, LAFB  Recent Labs: 06/27/2018: ALT 25 06/28/2018: B Natriuretic Peptide 545.9; TSH 0.708 06/29/2018: BUN 22; Creatinine, Ser 1.02; Hemoglobin 11.9; Platelets 187; Potassium 4.7; Sodium 137  Recent Lipid Panel No results found for: CHOL, TRIG, HDL, CHOLHDL, VLDL, LDLCALC, LDLDIRECT  Physical Exam:    VS:  BP Marland Kitchen)  102/54   Pulse 88   Ht 5\' 9"  (1.753 m)   Wt 180 lb 12.8 oz (82 kg)   SpO2 98%   BMI 26.70 kg/m     Wt Readings from Last 3 Encounters:  08/06/18 180 lb 12.8 oz (82 kg)  06/28/18 189 lb (85.7 kg)  10/08/14 185 lb (83.9 kg)     GEN: Well nourished, well developed in no acute distress HEENT: Normal NECK: No JVD; No carotid bruits LYMPHATICS: No lymphadenopathy CARDIAC: regular rhythm with occasional PVC, normal S1 and S2, no rubs, gallops. 4/6 SEM that increases with Valsalva. Radial and DP pulses 2+ bilaterally. RESPIRATORY:  Coarse at bases but without rales, wheezing or rhonchi  ABDOMEN: Soft, non-tender, non-distended MUSCULOSKELETAL:  No edema; No deformity  SKIN: Warm and dry NEUROLOGIC:  Alert and oriented x 3 PSYCHIATRIC:  Normal affect   ASSESSMENT:    1. HOCM  (hypertrophic obstructive cardiomyopathy) ([place])   2. Left ventricular outflow tract obstruction   3. Chronic obstructive pulmonary disease, unspecified COPD type ([place])    PLAN:    1. HOCM with LVOT obstruction: no further syncope.  -tolerating carvedilol, though no room in blood pressure for further titration today.   -counseled on avoiding dehydration  -reviewed echo again with him  -no family history, near syncope likely related to cough and not rhythm. No ICD planned at this time.  -given the discovery of this at his age, and with his elevated BP in the hospital, suspect this is not a genetic syndrome but rather a manifestation of his LVH from silent hypertension. We did discuss in the hospital that sometimes this can be genetic, but no other family history that is concerning.   2. COPD with chronic shortness of breath: recently treated for pneumonia/COPD. Cough and SOB lingers. Appears euvolemic today. Likely lung etiology for SOB  Plan for follow up: 2 months to monitor response to treatment.  Medication Adjustments/Labs and Tests Ordered: Current medicines are reviewed at length with the patient today.  Concerns regarding medicines are outlined above.  No orders of the defined types were placed in this encounter.  No orders of the defined types were placed in this encounter.   Patient Instructions  Medication Instructions:  Your Physician recommend you continue on your current medication as directed.    If you need a refill on your cardiac medications before your next appointment, please call your pharmacy.   Lab work: None   Testing/Procedures: None  Follow-Up: At [facility], you and your health needs are our priority.  As part of our continuing mission to provide you with exceptional heart care, we have created designated Provider Care Teams.  These Care Teams include your primary Cardiologist (physician) and Advanced Practice Providers (APPs -  Physician Assistants  and Nurse Practitioners) who all work together to provide you with the care you need, when you need it. You will need a follow up appointment in 6 months.  Please call our office 2 months in advance to schedule this appointment.  You may see Buford Dresser, MD or one of the following Advanced Practice Providers on your designated Care Team:   Rosaria Ferries, PA-C . Jory Sims, DNP, ANP  Any Other Special Instructions Will Be Listed Below (If Applicable).       Signed, Buford Dresser, MD PhD 08/06/2018 8:05 AM    [place]

## 2018-08-06 NOTE — Patient Instructions (Signed)
Medication Instructions:  Your Physician recommend you continue on your current medication as directed.    If you need a refill on your cardiac medications before your next appointment, please call your pharmacy.   Lab work: None   Testing/Procedures: None  Follow-Up: At [facility], you and your health needs are our priority.  As part of our continuing mission to provide you with exceptional heart care, we have created designated Provider Care Teams.  These Care Teams include your primary Cardiologist (physician) and Advanced Practice Providers (APPs -  Physician Assistants and Nurse Practitioners) who all work together to provide you with the care you need, when you need it. You will need a follow up appointment in 6 months.  Please call our office 2 months in advance to schedule this appointment.  You may see Bridgette Christopher, MD or one of the following Advanced Practice Providers on your designated Care Team:   Rhonda Barrett, PA-C . Kathryn Lawrence, DNP, ANP  Any Other Special Instructions Will Be Listed Below (If Applicable).    

## 2019-04-16 ENCOUNTER — Other Ambulatory Visit: Payer: Self-pay | Admitting: Pediatric Intensive Care

## 2019-04-16 DIAGNOSIS — Z20822 Contact with and (suspected) exposure to covid-19: Secondary | ICD-10-CM

## 2019-04-21 LAB — NOVEL CORONAVIRUS, NAA: SARS-CoV-2, NAA: NOT DETECTED

## 2019-05-19 ENCOUNTER — Ambulatory Visit: Payer: Medicare Other | Admitting: Cardiology

## 2019-08-11 ENCOUNTER — Encounter: Payer: Self-pay | Admitting: Cardiology

## 2019-08-11 ENCOUNTER — Other Ambulatory Visit: Payer: Self-pay

## 2019-08-11 ENCOUNTER — Ambulatory Visit: Payer: Medicare Other | Admitting: Cardiology

## 2019-08-11 VITALS — BP 128/70 | HR 72 | Temp 97.0°F | Ht 69.0 in | Wt 182.0 lb

## 2019-08-11 DIAGNOSIS — I452 Bifascicular block: Secondary | ICD-10-CM | POA: Diagnosis not present

## 2019-08-11 DIAGNOSIS — I421 Obstructive hypertrophic cardiomyopathy: Secondary | ICD-10-CM

## 2019-08-11 DIAGNOSIS — Z79899 Other long term (current) drug therapy: Secondary | ICD-10-CM | POA: Diagnosis not present

## 2019-08-11 DIAGNOSIS — I440 Atrioventricular block, first degree: Secondary | ICD-10-CM

## 2019-08-11 DIAGNOSIS — Z7189 Other specified counseling: Secondary | ICD-10-CM

## 2019-08-11 MED ORDER — METOPROLOL SUCCINATE ER 25 MG PO TB24: 25.0000 mg | ORAL_TABLET | Freq: Every day | ORAL | 3 refills | Status: DC

## 2019-08-11 NOTE — Patient Instructions (Addendum)
Medication Instructions:  Start: Metoprolol 25 mg daily  Lab Work: None  Testing/Procedures: None  Follow-Up: At [facility], you and your health needs are our priority.  As part of our continuing mission to provide you with exceptional heart care, we have created designated Provider Care Teams.  These Care Teams include your primary Cardiologist (physician) and Advanced Practice Providers (APPs -  Physician Assistants and Nurse Practitioners) who all work together to provide you with the care you need, when you need it.  Your next appointment:   12 months  The format for your next appointment:   In Person  Provider:   Buford Dresser, MD

## 2019-08-11 NOTE — Progress Notes (Signed)
Cardiology Office Note:    Date:  08/11/2019   ID:  Dan Freeman, DOB [birth year], MRN [id number]  PCP:  Maylon Peppers, MD  Cardiologist:  Buford Dresser, MD PhD  Referring MD: Maylon Peppers, MD   CC: follow up  History of Present Illness:    Dan Freeman is a 85 y.o. male with a hx of hypertension, COPD, prostate cancer s/p radiation, s/p esophagectomy 2/2 diverticulum, hypothyroidism who is seen for follow up today. I initially met him during his hospitalization 9/12-9/16/19 for shortness of breath (in the context of recent pneumonia, on inhalers/antibiotics/steroids) and mildly elevated/flat troponin. He did have near syncope with a coughing spell, thought to be 2/2 dynamic outflow obstruction from HOCM.   Today: Feels well overall. Does feel short if breath with heavy exertion/rushing, but if he takes it slow he can walk for an hour or more. Works a lot outside, uses the chain saw.   Doesn't check BP at home, excellent control today. Has not been taking carvedilol 6.25 mg BID, he is not sure for how long. Only taking one of his three inhalers, he is not sure which one. Taking aspirin, no bleeding. Is taking synthroid and lodine.  Staying safe/away from Covid. Does have occasional cough, no syncope but does get dizzy/lightheaded.  Denies chest pain, shortness of breath at rest. No PND, orthopnea, LE edema or unexpected weight gain. No syncope or palpitations.   Past Medical History:  Diagnosis Date  . Arthritis   . Cancer ([place])     prostrate     radiation  . COPD (chronic obstructive pulmonary disease) ([place])   . Esophageal diverticulum    s/p esophageal resection  . Essential hypertension   . HOCM (hypertrophic obstructive cardiomyopathy) ([place])    Not congenital, likely 2/2 uncontrolled HBP  . Holosystolic murmur    10/17/70 echo shows LVOT stenosis consistent w/ HOCM and likely 2/2 uncontrolled HBP     Past Surgical History:  Procedure Laterality Date  .  ESOPHAGECTOMY    . SHOULDER ARTHROSCOPY  01/05/2012   Procedure: ARTHROSCOPY SHOULDER;  Surgeon: Sharmon Revere, MD;  Location: [facility];  Service: Orthopedics;  Laterality: Right;  acromioplasty    Current Medications: Current Outpatient Medications on File Prior to Visit  Medication Sig  . albuterol (PROVENTIL HFA;VENTOLIN HFA) 108 (90 BASE) MCG/ACT inhaler Inhale 2 puffs into the lungs every 6 (six) hours as needed for shortness of breath.   Marland Kitchen aspirin EC 81 MG tablet Take 81 mg by mouth daily.  Marland Kitchen b complex vitamins tablet Take 1 tablet by mouth daily.  . carvedilol (COREG) 6.25 MG tablet Take 6.25 mg by mouth 2 (two) times daily with a meal.  . etodolac (LODINE) 400 MG tablet Take 400 mg by mouth daily.  . fluticasone furoate-vilanterol (BREO ELLIPTA) 100-25 MCG/INH AEPB Inhale 1 puff into the lungs daily.  Marland Kitchen GINKGO BILOBA EXTRACT PO Take 1 capsule by mouth daily.   Marland Kitchen levothyroxine (SYNTHROID, LEVOTHROID) 50 MCG tablet Take 50 mcg by mouth daily before breakfast.  . Misc Natural Products (GINSENG COMPLEX PO) Take 1 capsule by mouth daily.   Marland Kitchen tiotropium (SPIRIVA HANDIHALER) 18 MCG inhalation capsule Place 1 capsule (18 mcg total) into inhaler and inhale daily.  . vitamin B-12 (CYANOCOBALAMIN) 50 MCG tablet Take 50 mcg by mouth daily.   No current facility-administered medications on file prior to visit.      Allergies:   Patient has no known allergies.   Social  History   Socioeconomic History  . Marital status: Married    Spouse name: Not on file  . Number of children: Not on file  . Years of education: Not on file  . Highest education level: Not on file  Occupational History  . Not on file  Social Needs  . Financial resource strain: Somewhat hard  . Food insecurity    Worry: Never true    Inability: Never true  . Transportation needs    Medical: No    Non-medical: No  Tobacco Use  . Smoking status: Never Smoker  . Smokeless tobacco: Never Used  Substance and Sexual  Activity  . Alcohol use: No  . Drug use: No  . Sexual activity: Not on file  Lifestyle  . Physical activity    Days per week: 7 days    Minutes per session: 80 min  . Stress: Not at all  Relationships  . Social connections    Talks on phone: More than three times a week    Gets together: More than three times a week    Attends religious service: More than 4 times per year    Active member of club or organization: Yes    Attends meetings of clubs or organizations: More than 4 times per year    Relationship status: Married  Other Topics Concern  . Not on file  Social History Narrative  . Not on file     Family History: The patient's family history includes Cancer in his brother; Diabetes Mellitus II in his sister.  ROS:   Please see the history of present illness.  Additional pertinent ROS: Constitutional: Negative for chills, fever, night sweats, unintentional weight loss  HENT: Negative for ear pain and hearing loss.   Eyes: Negative for loss of vision and eye pain.  Respiratory: Negative for cough, sputum, wheezing.   Cardiovascular: See HPI. Gastrointestinal: Negative for abdominal pain, melena, and hematochezia.  Genitourinary: Negative for dysuria and hematuria.  Musculoskeletal: Negative for falls and myalgias.  Skin: Negative for itching and rash.  Neurological: Negative for focal weakness, focal sensory changes and loss of consciousness.  Endo/Heme/Allergies: Does not bruise/bleed easily.    EKGs/Labs/Other Studies Reviewed:    The following studies were reviewed today: 06/28/18 TTE Study Conclusions - Left ventricle: The cavity size was normal. There was moderate concentric hypertrophy. Systolic function was hyperdynamic. The estimated ejection fraction was in the range of 75% to 80%. Wall motion was normal; there were no regional wall motion abnormalities. - Mitral valve: There was probable severe systolic anterior motion of the anterior leaflet.  There was mild regurgitation. - Left atrium: The atrium was mildly dilated. Impressions: - There is moderate aortic outflow stenosis, which appears to be mostly a subvalvular problem. Images are technically difficult and cannot entirely exclude a component of valvular aortic stenosis. Recommendations: 1. Consider transesophageal echocardiography if clinically indicated to clarify mechanism of LV outflow tract stenosis. 2. Possible hypertrophic obstructive cardiomyopathy.  EKG:  The ekg is personally reviewed. ECG ordered today demonstrates SR, 1st degree AV block, RBBB, LAFB  Recent Labs: No results found for requested labs within last 8760 hours.  Recent Lipid Panel No results found for: CHOL, TRIG, HDL, CHOLHDL, VLDL, LDLCALC, LDLDIRECT  Physical Exam:    VS:  BP 128/70 (BP Location: Left Arm, Patient Position: Sitting, Cuff Size: Normal)   Pulse 72   Temp (!) 97 F (36.1 C)   Ht 5' 9"  (1.753 m)   Wt 182 lb (  82.6 kg)   BMI 26.88 kg/m     Wt Readings from Last 3 Encounters:  08/11/19 182 lb (82.6 kg)  08/06/18 180 lb 12.8 oz (82 kg)  06/28/18 189 lb (85.7 kg)    GEN: Well nourished, well developed in no acute distress HEENT: Normal, moist mucous membranes NECK: No JVD CARDIAC: regular rhythm, normal S1 and S2, no rubs or gallops. 2/6 SEM without appreciable difference with valsalva. VASCULAR: Radial and DP pulses 2+ bilaterally. No carotid bruits RESPIRATORY:  Clear to auscultation without rales, wheezing or rhonchi  ABDOMEN: Soft, non-tender, non-distended MUSCULOSKELETAL:  Ambulates independently SKIN: Warm and dry, no edema NEUROLOGIC:  Alert and oriented x 3. No focal neuro deficits noted. PSYCHIATRIC:  Normal affect   ASSESSMENT:    1. HOCM (hypertrophic obstructive cardiomyopathy) ([place])   2. Educated about COVID-19 virus infection   3. Medication management   4. RBBB (right bundle branch block with left anterior fascicular block)   5. First degree  AV block    PLAN:    HOCM with LVOT obstruction: no further syncope. -murmur less pronounced today, though still present -it is a difficult line--he does endorse shortness of breath with significant exertion as well as lightheadedness when he coughs (though no complete syncope again). This is without him taking the carvedilol for some time. However, he has significant conduction system disease (see below) -discussed risk/benefit of beta blocker. I would still consider him symptomatic, especially with any exercise that is more static (ie lifting) than cardiovascular -we will try to add a low dose of metoprolol succinate to see if it helps his symptoms -if his symptoms worsen, we will stop metoprolol -counseled on avoiding dehydration -previously discussed, no plans for ICD -previously discussed that this can be genetic, recommendations for first degree family member screening  Significant conduction disease: 1st degree AV block, RBBB, LAFB -denies symptoms concerning for high degree conduction disease (ie further syncope) -see risk/benefit above re: metoprolol, monitor symptoms  Educated about Covid: he is high risk, counseled on importance of social distancing, hand washing, masks  Plan for follow up: 12 mos or sooner PRN  Medication Adjustments/Labs and Tests Ordered: Current medicines are reviewed at length with the patient today.  Concerns regarding medicines are outlined above.  Orders Placed This Encounter  Procedures  . EKG 12-Lead   Meds ordered this encounter  Medications  . metoprolol succinate (TOPROL-XL) 25 MG 24 hr tablet    Sig: Take 1 tablet (25 mg total) by mouth daily. Take with or immediately following a meal.    Dispense:  90 tablet    Refill:  3    Patient Instructions  Medication Instructions:  Start: Metoprolol 25 mg daily  Lab Work: None  Testing/Procedures: None  Follow-Up: At [facility], you and your health needs are our priority.  As part of  our continuing mission to provide you with exceptional heart care, we have created designated Provider Care Teams.  These Care Teams include your primary Cardiologist (physician) and Advanced Practice Providers (APPs -  Physician Assistants and Nurse Practitioners) who all work together to provide you with the care you need, when you need it.  Your next appointment:   12 months  The format for your next appointment:   In Person  Provider:   Buford Dresser, MD    Signed, Buford Dresser, MD PhD 08/11/2019 8:51 AM    [place]

## 2019-08-12 DIAGNOSIS — I452 Bifascicular block: Secondary | ICD-10-CM | POA: Insufficient documentation

## 2019-08-12 DIAGNOSIS — I440 Atrioventricular block, first degree: Secondary | ICD-10-CM | POA: Insufficient documentation

## 2019-10-22 ENCOUNTER — Other Ambulatory Visit: Payer: Self-pay

## 2019-10-22 DIAGNOSIS — Z20822 Contact with and (suspected) exposure to covid-19: Secondary | ICD-10-CM

## 2019-10-23 LAB — NOVEL CORONAVIRUS, NAA: SARS-CoV-2, NAA: NOT DETECTED

## 2020-03-07 IMAGING — CR DG CHEST 2V
2 series · 2 of 2 positions shown · non-contrast
Comparison: 02/19/2018

CLINICAL DATA: Short of breath, cough, chills

EXAM:
CHEST - 2 VIEW

[chest pa]
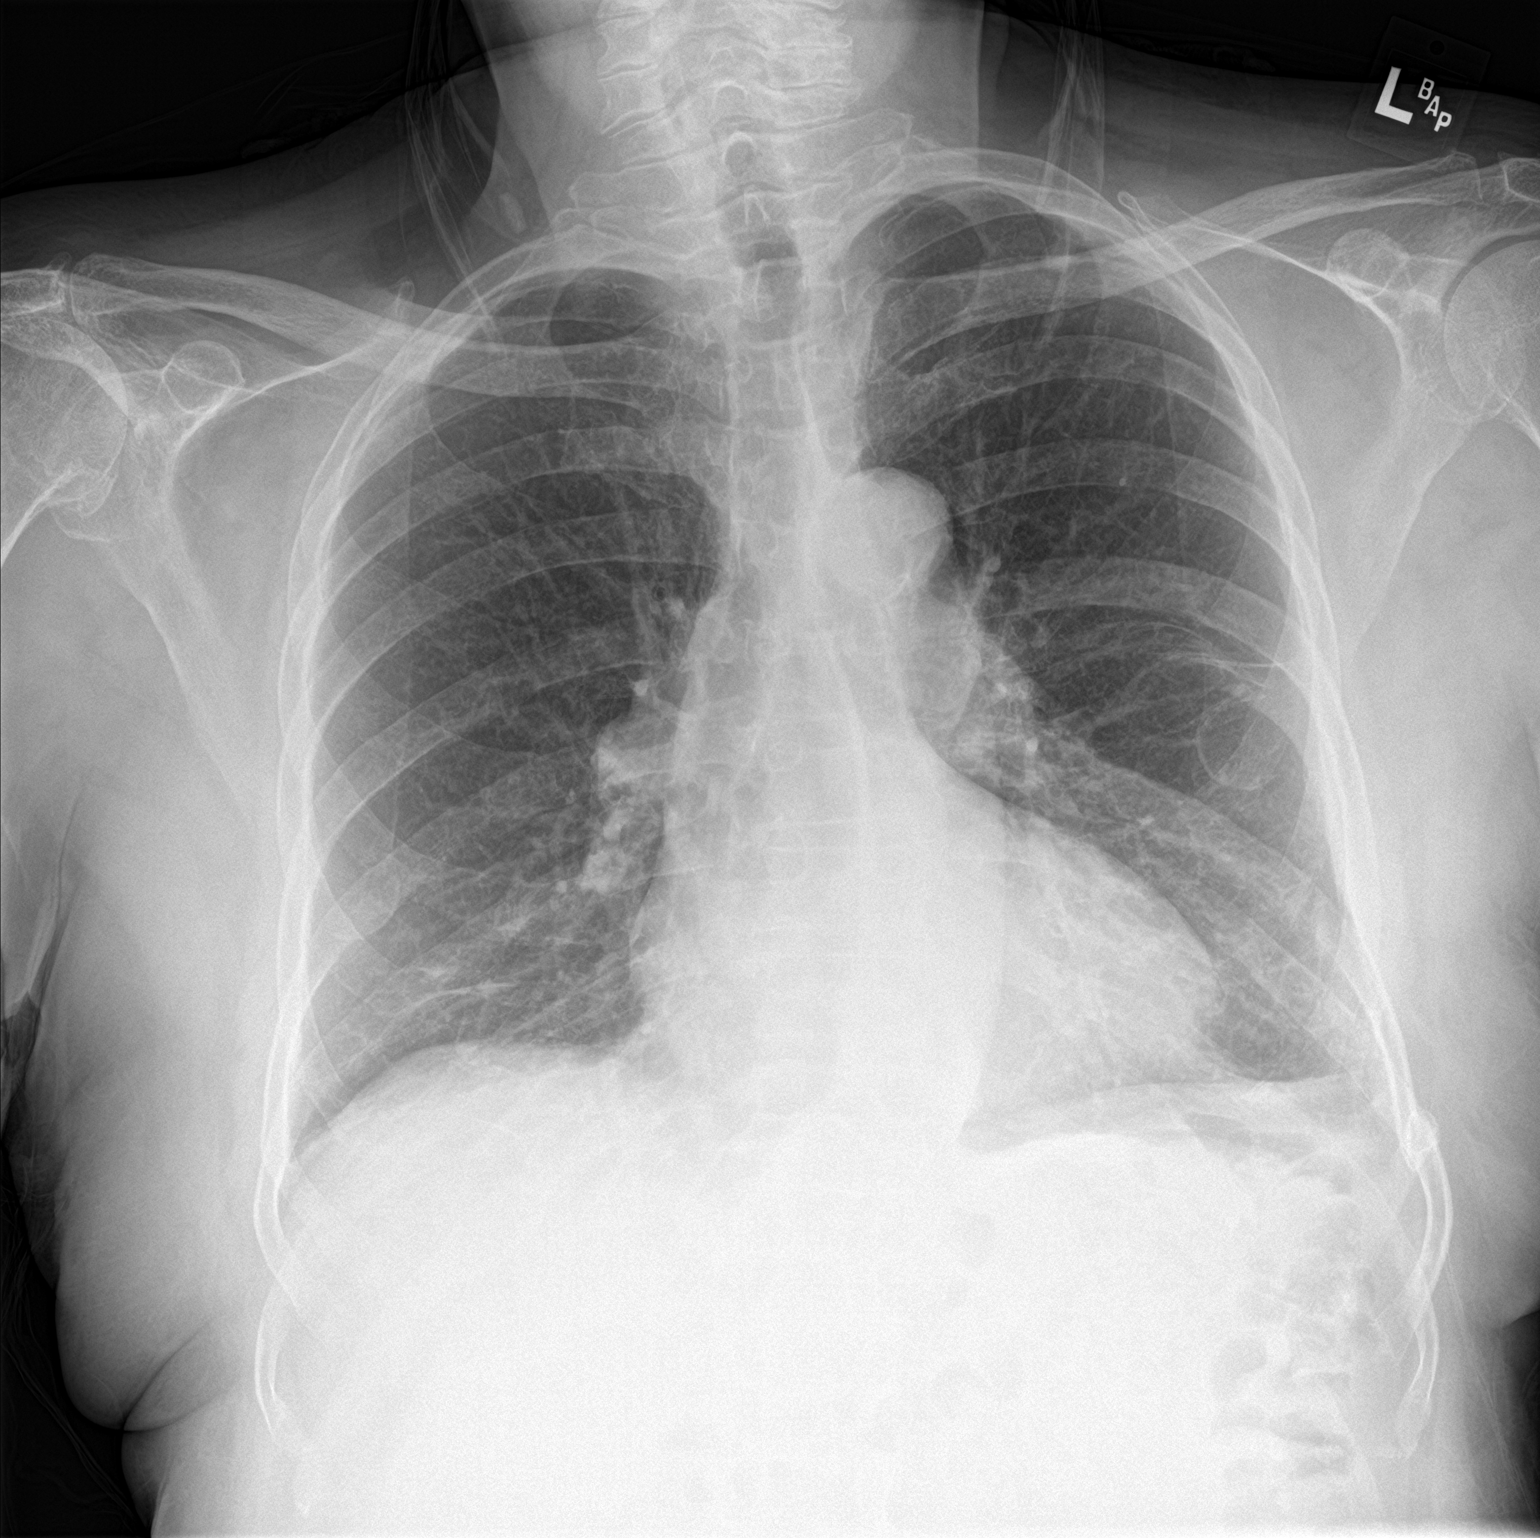

[chest lat]
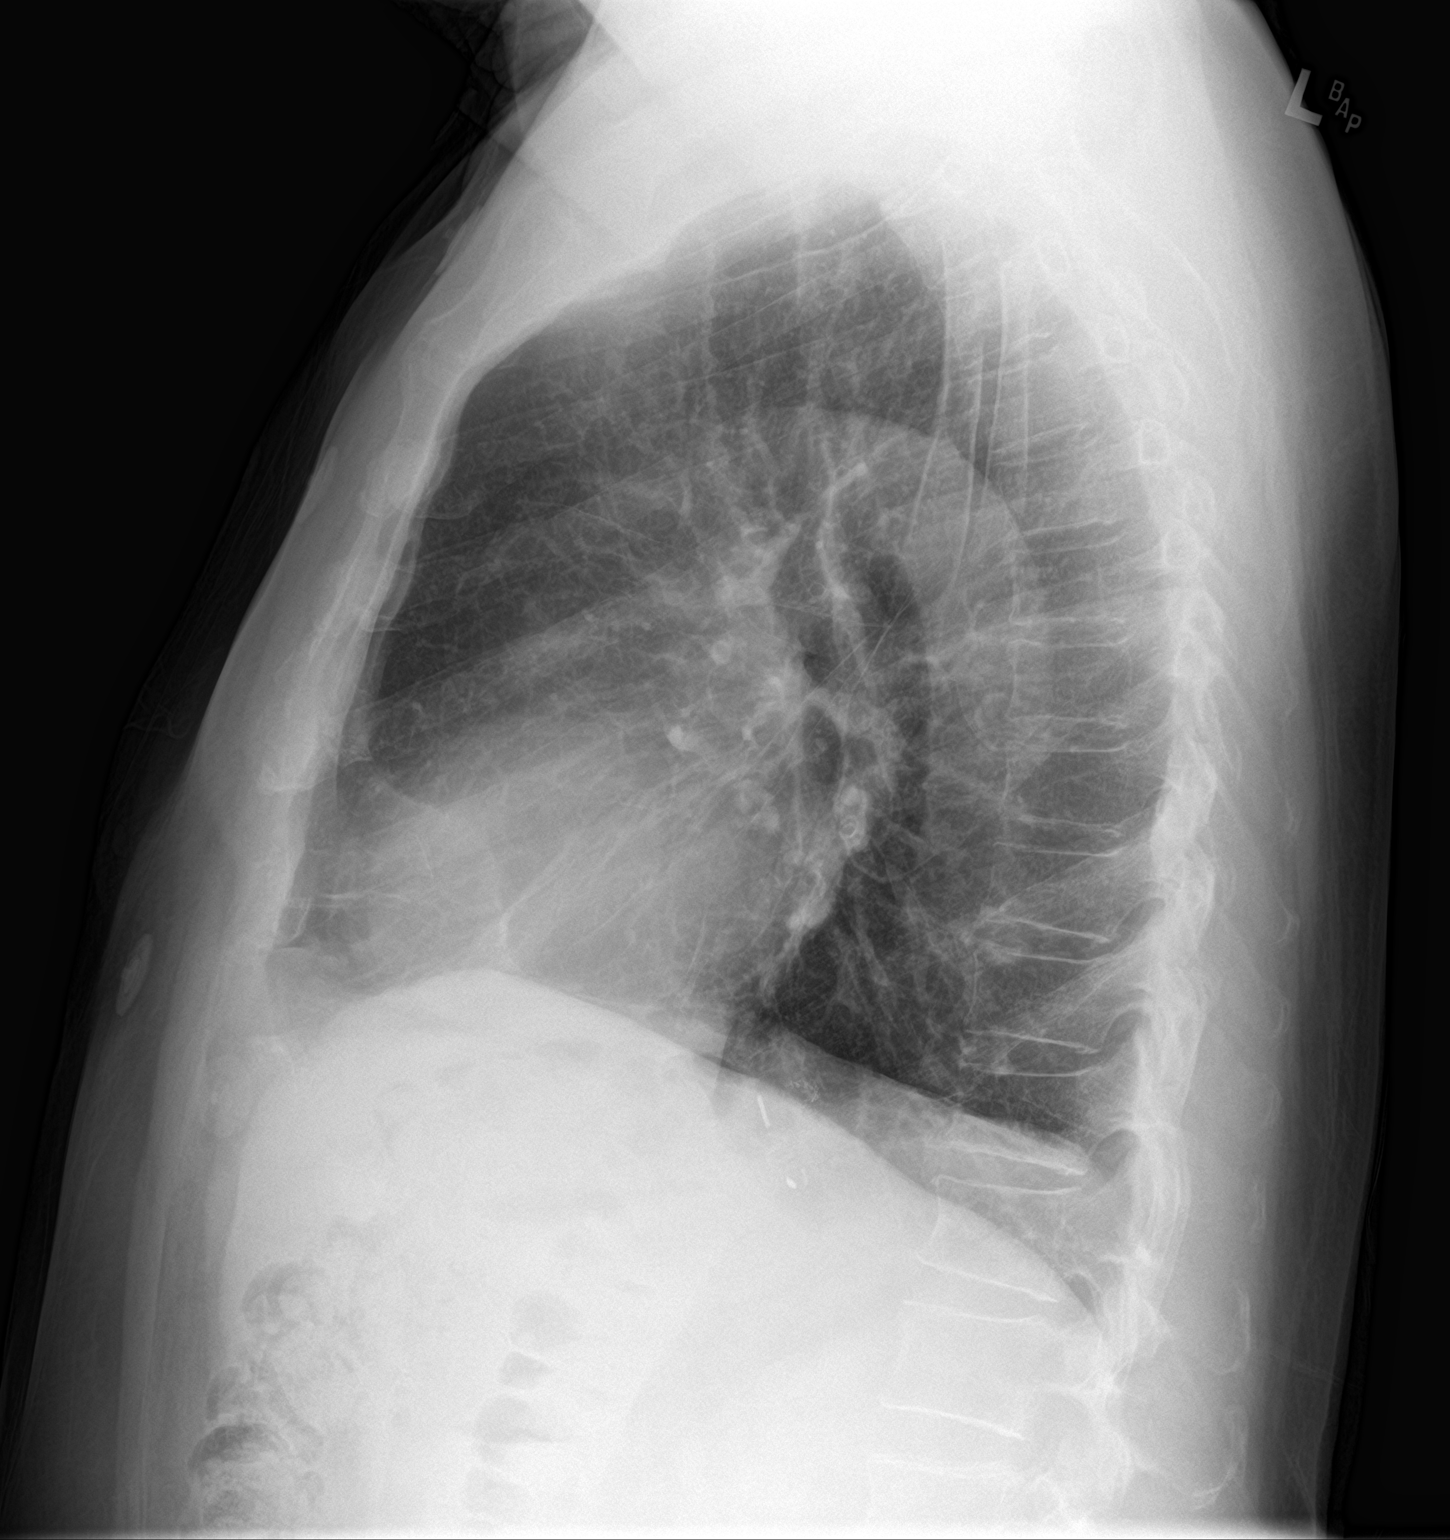

[2 of 2 positions shown; findings below may reference images not displayed]

FINDINGS: Small nodular density at the left apex is stable. The heart is
borderline enlarged. Bibasilar opacities left greater than right are
increased compatible with atelectasis superimposed upon scarring.
Normal vascularity. No pneumothorax or pleural effusion. Left
lateral rib deformity is chronic.
IMPRESSION: Stable nodular density at the left apex. If not already performed,
CT is recommended.

Bibasilar atelectasis superimposed upon scarring.

## 2020-07-14 ENCOUNTER — Telehealth: Payer: Self-pay | Admitting: Cardiology

## 2020-07-14 NOTE — Telephone Encounter (Signed)
lvm for patient to return call to get follow up visit scheduled with Harrell Gave from recall list

## 2020-08-12 ENCOUNTER — Encounter: Payer: Self-pay | Admitting: Cardiology

## 2020-08-12 ENCOUNTER — Ambulatory Visit: Payer: Medicare Other | Admitting: Cardiology

## 2020-08-12 ENCOUNTER — Other Ambulatory Visit: Payer: Self-pay

## 2020-08-12 VITALS — BP 142/82 | HR 80 | Ht 69.0 in | Wt 188.0 lb

## 2020-08-12 DIAGNOSIS — I421 Obstructive hypertrophic cardiomyopathy: Secondary | ICD-10-CM | POA: Diagnosis not present

## 2020-08-12 DIAGNOSIS — I452 Bifascicular block: Secondary | ICD-10-CM

## 2020-08-12 DIAGNOSIS — R030 Elevated blood-pressure reading, without diagnosis of hypertension: Secondary | ICD-10-CM | POA: Diagnosis not present

## 2020-08-12 DIAGNOSIS — I440 Atrioventricular block, first degree: Secondary | ICD-10-CM | POA: Diagnosis not present

## 2020-08-12 MED ORDER — METOPROLOL SUCCINATE ER 25 MG PO TB24: 25.0000 mg | ORAL_TABLET | Freq: Every day | ORAL | 3 refills | Status: DC

## 2020-08-12 NOTE — Patient Instructions (Signed)

## 2020-08-12 NOTE — Progress Notes (Signed)
Cardiology Office Note:    Date:  08/12/2020   ID:  Dan Freeman, DOB [birth year], MRN [id number]  PCP:  Maylon Peppers, MD  Cardiologist:  Buford Dresser, MD PhD  Referring MD: Maylon Peppers, MD   CC: follow up  History of Present Illness:    Dan Freeman is a 86 y.o. male with a hx of hypertension, COPD, prostate cancer s/p radiation, s/p esophagectomy 2/2 diverticulum, hypothyroidism who is seen for follow up today. I initially met him during his hospitalization 9/12-9/16/19 for shortness of breath (in the context of recent pneumonia, on inhalers/antibiotics/steroids) and mildly elevated/flat troponin. He did have near syncope with a coughing spell, thought to be 2/2 dynamic outflow obstruction from HOCM.   Today: Doing well overall. Notes shortness of breath with significant exertion; mostly with bending over.   No more syncope. No chest pain. Feels he is doing well for his age.  Denies chest pain, shortness of breath at rest. No PND, orthopnea, LE edema or unexpected weight gain. No syncope or palpitations.   Past Medical History:  Diagnosis Date  . Arthritis   . Cancer ([place])     prostrate     radiation  . COPD (chronic obstructive pulmonary disease) ([place])   . Esophageal diverticulum    s/p esophageal resection  . Essential hypertension   . HOCM (hypertrophic obstructive cardiomyopathy) ([place])    Not congenital, likely 2/2 uncontrolled HBP  . Holosystolic murmur    3/87/56 echo shows LVOT stenosis consistent w/ HOCM and likely 2/2 uncontrolled HBP     Past Surgical History:  Procedure Laterality Date  . ESOPHAGECTOMY    . SHOULDER ARTHROSCOPY  01/05/2012   Procedure: ARTHROSCOPY SHOULDER;  Surgeon: Sharmon Revere, MD;  Location: [facility];  Service: Orthopedics;  Laterality: Right;  acromioplasty    Current Medications: Current Outpatient Medications on File Prior to Visit  Medication Sig  . albuterol (PROVENTIL HFA;VENTOLIN HFA) 108 (90 BASE) MCG/ACT  inhaler Inhale 2 puffs into the lungs every 6 (six) hours as needed for shortness of breath.   Marland Kitchen aspirin EC 81 MG tablet Take 81 mg by mouth daily.  Marland Kitchen b complex vitamins tablet Take 1 tablet by mouth daily.  Marland Kitchen etodolac (LODINE) 400 MG tablet Take 400 mg by mouth daily.  . fluticasone furoate-vilanterol (BREO ELLIPTA) 100-25 MCG/INH AEPB Inhale 1 puff into the lungs daily.  Marland Kitchen GINKGO BILOBA EXTRACT PO Take 1 capsule by mouth daily.   Marland Kitchen levothyroxine (SYNTHROID, LEVOTHROID) 50 MCG tablet Take 50 mcg by mouth daily before breakfast.  . metoprolol succinate (TOPROL-XL) 25 MG 24 hr tablet Take 1 tablet (25 mg total) by mouth daily. Take with or immediately following a meal.  . Misc Natural Products (GINSENG COMPLEX PO) Take 1 capsule by mouth daily.   Marland Kitchen tiotropium (SPIRIVA HANDIHALER) 18 MCG inhalation capsule Place 1 capsule (18 mcg total) into inhaler and inhale daily.  . vitamin B-12 (CYANOCOBALAMIN) 50 MCG tablet Take 50 mcg by mouth daily.   No current facility-administered medications on file prior to visit.     Allergies:   Patient has no known allergies.   Social History   Tobacco Use  . Smoking status: Never Smoker  . Smokeless tobacco: Never Used  Substance Use Topics  . Alcohol use: No  . Drug use: No    Family History: The patient's family history includes Cancer in his brother; Diabetes Mellitus II in his sister.  ROS:   Please see the history  of present illness.  Additional pertinent ROS otherwise unremarkable.   EKGs/Labs/Other Studies Reviewed:    The following studies were reviewed today: 06/28/18 TTE Study Conclusions - Left ventricle: The cavity size was normal. There was moderate concentric hypertrophy. Systolic function was hyperdynamic. The estimated ejection fraction was in the range of 75% to 80%. Wall motion was normal; there were no regional wall motion abnormalities. - Mitral valve: There was probable severe systolic anterior motion of the  anterior leaflet. There was mild regurgitation. - Left atrium: The atrium was mildly dilated. Impressions: - There is moderate aortic outflow stenosis, which appears to be mostly a subvalvular problem. Images are technically difficult and cannot entirely exclude a component of valvular aortic stenosis. Recommendations: 1. Consider transesophageal echocardiography if clinically indicated to clarify mechanism of LV outflow tract stenosis. 2. Possible hypertrophic obstructive cardiomyopathy.  EKG:  The ekg is personally reviewed. ECG ordered today demonstrates SR, 1st degree AV block, RBBB, LAFB at 80 bpm  Recent Labs: No results found for requested labs within last 8760 hours.  Recent Lipid Panel No results found for: CHOL, TRIG, HDL, CHOLHDL, VLDL, LDLCALC, LDLDIRECT  Physical Exam:    VS:  BP (!) 142/82 (BP Location: Left Arm, Patient Position: Sitting, Cuff Size: Normal)   Pulse 80   Ht 5' 9"  (1.753 m)   Wt 188 lb (85.3 kg)   BMI 27.76 kg/m     Wt Readings from Last 3 Encounters:  08/12/20 188 lb (85.3 kg)  08/11/19 182 lb (82.6 kg)  08/06/18 180 lb 12.8 oz (82 kg)    GEN: Well nourished, well developed in no acute distress HEENT: Normal, moist mucous membranes NECK: No JVD CARDIAC: regular rhythm, normal S1 and S2, no rubs or gallops. 2/6 systolic ejection murmur. VASCULAR: Radial and DP pulses 2+ bilaterally. No carotid bruits RESPIRATORY:  Clear to auscultation without rales, wheezing or rhonchi  ABDOMEN: Soft, non-tender, non-distended MUSCULOSKELETAL:  Ambulates independently SKIN: Warm and dry, no edema NEUROLOGIC:  Alert and oriented x 3. No focal neuro deficits noted. PSYCHIATRIC:  Normal affect   ASSESSMENT:    1. RBBB (right bundle branch block with left anterior fascicular block)   2. HOCM (hypertrophic obstructive cardiomyopathy) ([place])   3. First degree AV block   4. Elevated blood pressure reading    PLAN:    HOCM with LVOT obstruction: no  further syncope. -tolerating metoprolol. Cautious use given conduction disease -counseled on avoiding dehydration -previously discussed, no plans for ICD -previously discussed that this can be genetic, recommendations for first degree family member screening  Elevated blood pressure reading: last visit at goal. Given age, HOCM, would monitor and not overcorrect  Significant conduction disease: 1st degree AV block, RBBB, LAFB -denies symptoms concerning for high degree conduction disease (ie further syncope) -see risk/benefit above re: metoprolol, monitor symptoms  Plan for follow up: 12 mos or sooner PRN  Medication Adjustments/Labs and Tests Ordered: Current medicines are reviewed at length with the patient today.  Concerns regarding medicines are outlined above.  Orders Placed This Encounter  Procedures  . EKG 12-Lead   Meds ordered this encounter  Medications  . metoprolol succinate (TOPROL-XL) 25 MG 24 hr tablet    Sig: Take 1 tablet (25 mg total) by mouth daily. Take with or immediately following a meal.    Dispense:  90 tablet    Refill:  3    Patient Instructions  Medication Instructions:  Your Physician recommend you continue on your current medication as directed.    *  If you need a refill on your cardiac medications before your next appointment, please call your pharmacy*   Lab Work: None ordered   Testing/Procedures: None ordered    Follow-Up: At [facility], you and your health needs are our priority.  As part of our continuing mission to provide you with exceptional heart care, we have created designated Provider Care Teams.  These Care Teams include your primary Cardiologist (physician) and Advanced Practice Providers (APPs -  Physician Assistants and Nurse Practitioners) who all work together to provide you with the care you need, when you need it.  We recommend signing up for the patient portal called "MyChart".  Sign up information is provided on this  After Visit Summary.  MyChart is used to connect with patients for Virtual Visits (Telemedicine).  Patients are able to view lab/test results, encounter notes, upcoming appointments, etc.  Non-urgent messages can be sent to your provider as well.   To learn more about what you can do with MyChart, go to [email or web address].    Your next appointment:   1 year(s)  The format for your next appointment:   In Person  Provider:   Buford Dresser, MD     Signed, Buford Dresser, MD PhD 08/12/2020     [place]

## 2020-10-11 ENCOUNTER — Encounter: Payer: Self-pay | Admitting: Cardiology

## 2021-09-23 ENCOUNTER — Ambulatory Visit (INDEPENDENT_AMBULATORY_CARE_PROVIDER_SITE_OTHER): Payer: Medicare Other | Admitting: Pulmonary Disease

## 2021-09-23 ENCOUNTER — Other Ambulatory Visit: Payer: Self-pay

## 2021-09-23 ENCOUNTER — Encounter: Payer: Self-pay | Admitting: Pulmonary Disease

## 2021-09-23 VITALS — BP 122/68 | HR 117 | Temp 97.6°F | Ht 69.0 in | Wt 184.2 lb

## 2021-09-23 DIAGNOSIS — R0609 Other forms of dyspnea: Secondary | ICD-10-CM | POA: Diagnosis not present

## 2021-09-23 DIAGNOSIS — J4540 Moderate persistent asthma, uncomplicated: Secondary | ICD-10-CM

## 2021-09-23 MED ORDER — TRELEGY ELLIPTA 200-62.5-25 MCG/ACT IN AEPB: 1.0000 | INHALATION_SPRAY | Freq: Every day | RESPIRATORY_TRACT | 3 refills | Status: DC

## 2021-09-23 NOTE — Progress Notes (Signed)
@Patient  ID: Dan Freeman, male    DOB: Dec 11, 1930, 87 y.o.   MRN: [id number]  Chief Complaint  Patient presents with   Consult    COPD consult. Last couple of weeks pt states his breathing has been worse. IS currently on albuterol but only helps somewhat.     Referring provider: Vonna Drafts, FNP  HPI:   87 y.o. man whom we are seeing in consultation for evaluation of COPD.  No PFTs to review.  Reports history of asthma in past. Diagnosed many years ago. By and large no real symptoms.  However, he endorses worsening dyspnea over the last month.  Associated cough.  Will be worse in the evenings.  No time of day when things are better or worse.  No environmental or seasonal factors he can in the panic is better or worse.  No clear trigger, or viral illness.  He states his breathing has been well maintained on Breo.  But again symptoms worsen last month despite good adherence to this.  He does use albuterol and this helps his symptoms some.  No other alleviating or exacerbating factors.  He reports recent chest x-ray via PCP as I cannot view or see result.  He thinks this was normal.  Most recent xray 2019 clear with mild hyperinflation on lateral view on my review and interpretation. TTE 2019 reviewed with HOCM, LVOT obstruction, dilated LA.  PMH: asthma, HOCM, hypothyroid Surgical history:esophagectomy, shoulder surgery Family history: DM in sister, brother with cancer Social history: Never smoker, lives in [place] / Pulmonary Flowsheets:   ACT:  No flowsheet data found.  MMRC: No flowsheet data found.  Epworth:  No flowsheet data found.  Tests:   FENO:  No results found for: NITRICOXIDE  PFT: No flowsheet data found.  WALK:  No flowsheet data found.  Imaging: Personally reviewed and as per EMR  Lab Results: Personally reviewed CBC    Component Value Date/Time   WBC 9.5 06/29/2018 0422   RBC 4.26 06/29/2018 0422   HGB 11.9 (L)  06/29/2018 0422   HCT 36.8 (L) 06/29/2018 0422   PLT 187 06/29/2018 0422   MCV 86.4 06/29/2018 0422   MCH 27.9 06/29/2018 0422   MCHC 32.3 06/29/2018 0422   RDW 14.1 06/29/2018 0422   LYMPHSABS 0.9 06/27/2018 1655   MONOABS 0.2 06/27/2018 1655   EOSABS 0.0 06/27/2018 1655   BASOSABS 0.0 06/27/2018 1655    BMET    Component Value Date/Time   NA 137 06/29/2018 0422   K 4.7 06/29/2018 0422   CL 105 06/29/2018 0422   CO2 24 06/29/2018 0422   GLUCOSE 116 (H) 06/29/2018 0422   BUN 22 06/29/2018 0422   CREATININE 1.02 06/29/2018 0422   CALCIUM 9.1 06/29/2018 0422   GFRNONAA >60 06/29/2018 0422   GFRAA >60 06/29/2018 0422    BNP    Component Value Date/Time   BNP 545.9 (H) 06/28/2018 0054    ProBNP No results found for: PROBNP  Specialty Problems       Pulmonary Problems   COPD with acute exacerbation ([place])   Chronic obstructive pulmonary disease ([place])   Pneumonia    Currently on abx       No Known Allergies  Immunization History  Administered Date(s) Administered   Influenza, High Dose Seasonal PF 10/14/2014, 07/29/2019   Influenza,inj,Quad PF,6+ Mos 08/14/2016, 09/07/2017, 07/08/2018   Influenza-Unspecified 09/30/2008, 10/25/2009, 07/26/2010, 08/15/2011, 08/28/2013, 07/25/2015, 08/14/2016, 08/21/2021   Pneumococcal Conjugate-13 08/14/2016  Pneumococcal Polysaccharide-23 08/02/2011    Past Medical History:  Diagnosis Date   Arthritis    Cancer ([place])     prostrate     radiation   COPD (chronic obstructive pulmonary disease) ([place])    Esophageal diverticulum    s/p esophageal resection   Essential hypertension    HOCM (hypertrophic obstructive cardiomyopathy) ([facility])    Not congenital, likely 2/2 uncontrolled HBP   Holosystolic murmur    9/48/54 echo shows LVOT stenosis consistent w/ HOCM and likely 2/2 uncontrolled HBP     Tobacco History: Social History   Tobacco Use  Smoking Status Never  Smokeless Tobacco Never   Counseling given: Not  Answered   Continue to not smoke  Outpatient Encounter Medications as of 09/23/2021  Medication Sig   albuterol (PROVENTIL HFA;VENTOLIN HFA) 108 (90 BASE) MCG/ACT inhaler Inhale 2 puffs into the lungs every 6 (six) hours as needed for shortness of breath.    aspirin EC 81 MG tablet Take 81 mg by mouth daily.   b complex vitamins tablet Take 1 tablet by mouth daily.   etodolac (LODINE) 400 MG tablet Take 400 mg by mouth daily.   Fluticasone-Umeclidin-Vilant (TRELEGY ELLIPTA) 200-62.5-25 MCG/ACT AEPB Inhale 1 puff into the lungs daily.   GINKGO BILOBA EXTRACT PO Take 1 capsule by mouth daily.    levothyroxine (SYNTHROID, LEVOTHROID) 50 MCG tablet Take 50 mcg by mouth daily before breakfast.   Misc Natural Products (GINSENG COMPLEX PO) Take 1 capsule by mouth daily.    vitamin B-12 (CYANOCOBALAMIN) 50 MCG tablet Take 50 mcg by mouth daily.   [DISCONTINUED] fluticasone furoate-vilanterol (BREO ELLIPTA) 100-25 MCG/INH AEPB Inhale 1 puff into the lungs daily.   metoprolol succinate (TOPROL-XL) 25 MG 24 hr tablet Take 1 tablet (25 mg total) by mouth daily. Take with or immediately following a meal.   tiotropium (SPIRIVA HANDIHALER) 18 MCG inhalation capsule Place 1 capsule (18 mcg total) into inhaler and inhale daily.   No facility-administered encounter medications on file as of 09/23/2021.     Review of Systems  Review of Systems  No chest pain with exertion.  No orthopnea or PND.  Comprehensive review of systems otherwise negative. Physical Exam  BP 122/68 (BP Location: Left Arm, Patient Position: Sitting, Cuff Size: Normal)   Pulse (!) 117   Temp 97.6 F (36.4 C) (Oral)   Ht 5\' 9"  (1.753 m)   Wt 184 lb 3.2 oz (83.6 kg)   SpO2 98%   BMI 27.20 kg/m   Wt Readings from Last 5 Encounters:  09/23/21 184 lb 3.2 oz (83.6 kg)  08/12/20 188 lb (85.3 kg)  08/11/19 182 lb (82.6 kg)  08/06/18 180 lb 12.8 oz (82 kg)  06/28/18 189 lb (85.7 kg)    BMI Readings from Last 5 Encounters:   09/23/21 27.20 kg/m  08/12/20 27.76 kg/m  08/11/19 26.88 kg/m  08/06/18 26.70 kg/m  06/28/18 27.91 kg/m     Physical Exam General: Well-appearing, no acute distress Eyes: EOMI, no icterus Neck: Supple, no JVP Pulmonary: Clear, normal work of breathing Cardiovascular: Regular rate and rhythm, no murmur Abdomen: Nondistended, bowel sounds present MSK: No synovitis, joint effusion Neuro: Normal gait, no weakness Psych: Normal mood, full affect   Assessment & Plan:   Dyspnea on exertion: Likely multifactorial, cardiac component given left atrial dilation and HOCM.Marland Kitchen  Concern for more recent symptoms related to possibly asthma given prior hyperinflation on chest x-ray.  PFTs for further evaluation.  Asthma: With worsening symptoms on Breo.  Dyspnea worsening.  Escalate to Trelegy given uncontrolled symptoms on ICS/LABA.    Return in about 3 months (around 12/22/2021).   Lanier Clam, MD 09/23/2021

## 2021-09-23 NOTE — Patient Instructions (Signed)
Nice to meet you  Based on your chest x-ray as well as her history, I am worried your symptoms are related to poorly controlled asthma.  To help better treat this, use Trelegy 1 puff daily.  This is a new inhaler.  Once you start Trelegy you can stop Breo.  Please rinse your mouth out after every use of the Trelegy.  If the Trelegy is too expensive, please call the office.  We may need to fill out paperwork to help get the cost covered.  Continue the Breo until we can get Trelegy.  Rinse her mouth out after every use of the Breo.  I have ordered pulmonary function tests to help see how well the lungs are functioning.  Please schedule these at your convenience in the next week or 2 or later if needed.  Return to clinic in 3 months or sooner as needed with Dr. Silas Flood

## 2021-11-24 ENCOUNTER — Encounter: Payer: Self-pay | Admitting: Pulmonary Disease

## 2021-11-24 ENCOUNTER — Ambulatory Visit: Payer: Medicare Other | Admitting: Pulmonary Disease

## 2021-11-24 ENCOUNTER — Other Ambulatory Visit: Payer: Self-pay

## 2021-11-24 VITALS — BP 130/74 | HR 77 | Temp 98.2°F | Ht 70.0 in | Wt 191.6 lb

## 2021-11-24 DIAGNOSIS — R0609 Other forms of dyspnea: Secondary | ICD-10-CM | POA: Diagnosis not present

## 2021-11-24 LAB — CBC WITH DIFFERENTIAL/PLATELET
Basophils Absolute: 0 10*3/uL (ref 0.0–0.1)
Basophils Relative: 0.5 % (ref 0.0–3.0)
Eosinophils Absolute: 0.2 10*3/uL (ref 0.0–0.7)
Eosinophils Relative: 5.2 % — ABNORMAL HIGH (ref 0.0–5.0)
HCT: 37.4 % — ABNORMAL LOW (ref 39.0–52.0)
Hemoglobin: 12.1 g/dL — ABNORMAL LOW (ref 13.0–17.0)
Lymphocytes Relative: 37.5 % (ref 12.0–46.0)
Lymphs Abs: 1.6 10*3/uL (ref 0.7–4.0)
MCHC: 32.2 g/dL (ref 30.0–36.0)
MCV: 84.6 fl (ref 78.0–100.0)
Monocytes Absolute: 0.5 10*3/uL (ref 0.1–1.0)
Monocytes Relative: 12.5 % — ABNORMAL HIGH (ref 3.0–12.0)
Neutro Abs: 1.8 10*3/uL (ref 1.4–7.7)
Neutrophils Relative %: 44.3 % (ref 43.0–77.0)
Platelets: 186 10*3/uL (ref 150.0–400.0)
RBC: 4.43 Mil/uL (ref 4.22–5.81)
RDW: 15.9 % — ABNORMAL HIGH (ref 11.5–15.5)
WBC: 4.1 10*3/uL (ref 4.0–10.5)

## 2021-11-24 MED ORDER — ALBUTEROL SULFATE HFA 108 (90 BASE) MCG/ACT IN AERS: 2.0000 | INHALATION_SPRAY | Freq: Four times a day (QID) | RESPIRATORY_TRACT | 11 refills | Status: AC | PRN

## 2021-11-24 NOTE — Patient Instructions (Addendum)
Neck to see you again  I refilled the albuterol rescue inhaler.  Try to keep this on you especially since it seems to help with your symptoms.  Continue to Trelegy inhaler 1 puff daily.  Rinse mouth after every use.  We may need to consider changing this in the coming weeks to see if a different delivery system may improve your symptoms more.  We will get your pulmonary function test scheduled for you today, next available for further evaluation of your symptoms.  I ordered a blood test to check your blood count to make sure anemia is not contributing to your symptoms  Return to clinic in 4 weeks or sooner as needed with Dr. Silas Flood

## 2021-11-24 NOTE — Progress Notes (Signed)
@Patient  ID: Dan Freeman, male    DOB: 1930-11-17, 88 y.o.   MRN: [id number]  Chief Complaint  Patient presents with   Follow-up    Pt is here for follow up. He states that his shortness of breath is ok.     Referring provider: Maylon Peppers, MD  HPI:   88 y.o. man whom we are seeing in follow-up of dyspnea on exertion and reported COPD although no PFTs available to review.  At last visit, his ICS/LABA therapy was escalated to Trelegy given dyspnea in the setting of clinical diagnosis of asthma.  Evidence of hyperinflation on chest imaging to support this in the setting of never smoker.  Thinks is helped mildly but not significantly.  Still pretty dyspneic.  PFTs were ordered at last visit.  These were not scheduled prior to visit today.  We discussed the importance of PFTs for further evaluation of his symptoms.  Discussed changing inhaler from DPI to HFA in the future if not improving.  He notes as needed albuterol use does improve his symptoms of dyspnea on exertion.  HPI at initial visit: Reports history of asthma in past. Diagnosed many years ago. By and large no real symptoms.  However, he endorses worsening dyspnea over the last month.  Associated cough.  Will be worse in the evenings.  No time of day when things are better or worse.  No environmental or seasonal factors he can in the panic is better or worse.  No clear trigger, or viral illness.  He states his breathing has been well maintained on Breo.  But again symptoms worsen last month despite good adherence to this.  He does use albuterol and this helps his symptoms some.  No other alleviating or exacerbating factors.  He reports recent chest x-ray via PCP as I cannot view or see result.  He thinks this was normal.  Most recent xray 2019 clear with mild hyperinflation on lateral view on my review and interpretation. TTE 2019 reviewed with HOCM, LVOT obstruction, dilated LA.  PMH: asthma, HOCM, hypothyroid Surgical  history:esophagectomy, shoulder surgery Family history: DM in sister, brother with cancer Social history: Never smoker, lives in [employer] / Pulmonary Flowsheets:   ACT:  No flowsheet data found.  MMRC: No flowsheet data found.  Epworth:  No flowsheet data found.  Tests:   FENO:  No results found for: NITRICOXIDE  PFT: No flowsheet data found.  WALK:  No flowsheet data found.  Imaging: Personally reviewed and as per EMR  Lab Results: Personally reviewed CBC    Component Value Date/Time   WBC 9.5 06/29/2018 0422   RBC 4.26 06/29/2018 0422   HGB 11.9 (L) 06/29/2018 0422   HCT 36.8 (L) 06/29/2018 0422   PLT 187 06/29/2018 0422   MCV 86.4 06/29/2018 0422   MCH 27.9 06/29/2018 0422   MCHC 32.3 06/29/2018 0422   RDW 14.1 06/29/2018 0422   LYMPHSABS 0.9 06/27/2018 1655   MONOABS 0.2 06/27/2018 1655   EOSABS 0.0 06/27/2018 1655   BASOSABS 0.0 06/27/2018 1655    BMET    Component Value Date/Time   NA 137 06/29/2018 0422   K 4.7 06/29/2018 0422   CL 105 06/29/2018 0422   CO2 24 06/29/2018 0422   GLUCOSE 116 (H) 06/29/2018 0422   BUN 22 06/29/2018 0422   CREATININE 1.02 06/29/2018 0422   CALCIUM 9.1 06/29/2018 0422   GFRNONAA >60 06/29/2018 0422   GFRAA >60 06/29/2018 0422  BNP    Component Value Date/Time   BNP 545.9 (H) 06/28/2018 0054    ProBNP No results found for: PROBNP  Specialty Problems       Pulmonary Problems   COPD with acute exacerbation ([place])   Chronic obstructive pulmonary disease ([place])   Pneumonia    Currently on abx       No Known Allergies  Immunization History  Administered Date(s) Administered   Influenza, High Dose Seasonal PF 10/14/2014, 07/29/2019   Influenza,inj,Quad PF,6+ Mos 08/14/2016, 09/07/2017, 07/08/2018   Influenza-Unspecified 09/30/2008, 10/25/2009, 07/26/2010, 08/15/2011, 08/28/2013, 07/25/2015, 08/14/2016, 08/21/2021   Pneumococcal Conjugate-13 08/14/2016   Pneumococcal  Polysaccharide-23 08/02/2011    Past Medical History:  Diagnosis Date   Arthritis    Cancer ([place])     prostrate     radiation   COPD (chronic obstructive pulmonary disease) ([place])    Esophageal diverticulum    s/p esophageal resection   Essential hypertension    HOCM (hypertrophic obstructive cardiomyopathy) ([place])    Not congenital, likely 2/2 uncontrolled HBP   Holosystolic murmur    0/09/38 echo shows LVOT stenosis consistent w/ HOCM and likely 2/2 uncontrolled HBP     Tobacco History: Social History   Tobacco Use  Smoking Status Never  Smokeless Tobacco Never   Counseling given: Not Answered   Continue to not smoke  Outpatient Encounter Medications as of 11/24/2021  Medication Sig   aspirin EC 81 MG tablet Take 81 mg by mouth daily.   b complex vitamins tablet Take 1 tablet by mouth daily.   etodolac (LODINE) 400 MG tablet Take 400 mg by mouth daily.   Fluticasone-Umeclidin-Vilant (TRELEGY ELLIPTA) 200-62.5-25 MCG/ACT AEPB Inhale 1 puff into the lungs daily.   GINKGO BILOBA EXTRACT PO Take 1 capsule by mouth daily.    levothyroxine (SYNTHROID, LEVOTHROID) 50 MCG tablet Take 50 mcg by mouth daily before breakfast.   Misc Natural Products (GINSENG COMPLEX PO) Take 1 capsule by mouth daily.    vitamin B-12 (CYANOCOBALAMIN) 50 MCG tablet Take 50 mcg by mouth daily.   [DISCONTINUED] albuterol (PROVENTIL HFA;VENTOLIN HFA) 108 (90 BASE) MCG/ACT inhaler Inhale 2 puffs into the lungs every 6 (six) hours as needed for shortness of breath.    albuterol (VENTOLIN HFA) 108 (90 Base) MCG/ACT inhaler Inhale 2 puffs into the lungs every 6 (six) hours as needed for shortness of breath.   metoprolol succinate (TOPROL-XL) 25 MG 24 hr tablet Take 1 tablet (25 mg total) by mouth daily. Take with or immediately following a meal.   tiotropium (SPIRIVA HANDIHALER) 18 MCG inhalation capsule Place 1 capsule (18 mcg total) into inhaler and inhale daily.   No facility-administered encounter  medications on file as of 11/24/2021.     Review of Systems  Review of Systems  No chest pain with exertion.  No orthopnea or PND.  Comprehensive review of systems otherwise negative. Physical Exam  BP 130/74 (BP Location: Left Arm, Patient Position: Sitting, Cuff Size: Normal)    Pulse 77    Temp 98.2 F (36.8 C) (Oral)    Ht 5\' 10"  (1.778 m)    Wt 191 lb 9.6 oz (86.9 kg)    SpO2 98%    BMI 27.49 kg/m   Wt Readings from Last 5 Encounters:  11/24/21 191 lb 9.6 oz (86.9 kg)  09/23/21 184 lb 3.2 oz (83.6 kg)  08/12/20 188 lb (85.3 kg)  08/11/19 182 lb (82.6 kg)  08/06/18 180 lb 12.8 oz (82 kg)    BMI  Readings from Last 5 Encounters:  11/24/21 27.49 kg/m  09/23/21 27.20 kg/m  08/12/20 27.76 kg/m  08/11/19 26.88 kg/m  08/06/18 26.70 kg/m     Physical Exam General: Well-appearing, no acute distress Eyes: EOMI, no icterus Neck: Supple, no JVP Pulmonary: Clear, normal work of breathing Cardiovascular: Regular rate and rhythm, no murmur MSK: No synovitis, joint effusion Neuro: Normal gait, no weakness Psych: Normal mood, full affect   Assessment & Plan:   Dyspnea on exertion: Likely multifactorial, cardiac component given left atrial dilation and HOCM. Concern for more recent symptoms related to possibly asthma given prior hyperinflation on chest x-ray.  PFTs for further evaluation, unfortunate performed prior to visit, scheduled that day for the future.  CBC today to evaluate for potential underlying anemia is contributing to symptoms.  Most recent CBC that I can review is 58-year-old via care everywhere.  Asthma: Hyperinflation on chest imaging.  With worsening symptoms on Breo.  Escalated to Trelegy given uncontrolled symptoms on ICS/LABA.  Not particularly beneficial.  As needed albuterol does provide symptomatic relief although he forgets to keep it on his person at times.    Return in about 4 weeks (around 12/22/2021).   Lanier Clam, MD 11/24/2021

## 2021-11-25 ENCOUNTER — Inpatient Hospital Stay (HOSPITAL_COMMUNITY)
Admission: EM | Admit: 2021-11-25 | Discharge: 2021-11-28 | DRG: 175 | Disposition: A | Discharge status: Home / Self Care | Payer: Medicare Other | Attending: Internal Medicine | Admitting: Internal Medicine

## 2021-11-25 ENCOUNTER — Emergency Department (HOSPITAL_COMMUNITY): Payer: Medicare Other

## 2021-11-25 ENCOUNTER — Other Ambulatory Visit: Payer: Self-pay

## 2021-11-25 ENCOUNTER — Inpatient Hospital Stay (HOSPITAL_COMMUNITY): Payer: Medicare Other

## 2021-11-25 ENCOUNTER — Telehealth: Payer: Self-pay | Admitting: Pharmacy Technician

## 2021-11-25 ENCOUNTER — Encounter (HOSPITAL_COMMUNITY): Payer: Self-pay | Admitting: Emergency Medicine

## 2021-11-25 DIAGNOSIS — D638 Anemia in other chronic diseases classified elsewhere: Secondary | ICD-10-CM | POA: Diagnosis present

## 2021-11-25 DIAGNOSIS — J449 Chronic obstructive pulmonary disease, unspecified: Secondary | ICD-10-CM | POA: Diagnosis present

## 2021-11-25 DIAGNOSIS — N433 Hydrocele, unspecified: Secondary | ICD-10-CM | POA: Diagnosis present

## 2021-11-25 DIAGNOSIS — I2699 Other pulmonary embolism without acute cor pulmonale: Secondary | ICD-10-CM | POA: Diagnosis present

## 2021-11-25 DIAGNOSIS — N400 Enlarged prostate without lower urinary tract symptoms: Secondary | ICD-10-CM | POA: Diagnosis present

## 2021-11-25 DIAGNOSIS — E039 Hypothyroidism, unspecified: Secondary | ICD-10-CM | POA: Diagnosis present

## 2021-11-25 DIAGNOSIS — I10 Essential (primary) hypertension: Secondary | ICD-10-CM | POA: Diagnosis present

## 2021-11-25 DIAGNOSIS — Z79899 Other long term (current) drug therapy: Secondary | ICD-10-CM | POA: Diagnosis not present

## 2021-11-25 DIAGNOSIS — Z20822 Contact with and (suspected) exposure to covid-19: Secondary | ICD-10-CM | POA: Diagnosis present

## 2021-11-25 DIAGNOSIS — R55 Syncope and collapse: Secondary | ICD-10-CM | POA: Diagnosis present

## 2021-11-25 DIAGNOSIS — M1990 Unspecified osteoarthritis, unspecified site: Secondary | ICD-10-CM | POA: Diagnosis present

## 2021-11-25 DIAGNOSIS — Z7982 Long term (current) use of aspirin: Secondary | ICD-10-CM

## 2021-11-25 DIAGNOSIS — Z8546 Personal history of malignant neoplasm of prostate: Secondary | ICD-10-CM | POA: Diagnosis not present

## 2021-11-25 DIAGNOSIS — I421 Obstructive hypertrophic cardiomyopathy: Secondary | ICD-10-CM | POA: Diagnosis present

## 2021-11-25 DIAGNOSIS — Z7951 Long term (current) use of inhaled steroids: Secondary | ICD-10-CM

## 2021-11-25 DIAGNOSIS — R0609 Other forms of dyspnea: Secondary | ICD-10-CM

## 2021-11-25 DIAGNOSIS — I2602 Saddle embolus of pulmonary artery with acute cor pulmonale: Secondary | ICD-10-CM | POA: Diagnosis present

## 2021-11-25 DIAGNOSIS — Z79890 Hormone replacement therapy (postmenopausal): Secondary | ICD-10-CM | POA: Diagnosis not present

## 2021-11-25 DIAGNOSIS — N5082 Scrotal pain: Secondary | ICD-10-CM

## 2021-11-25 DIAGNOSIS — I2609 Other pulmonary embolism with acute cor pulmonale: Secondary | ICD-10-CM | POA: Diagnosis not present

## 2021-11-25 LAB — GLUCOSE, CAPILLARY
Glucose-Capillary: 74 mg/dL (ref 70–99)
Glucose-Capillary: 87 mg/dL (ref 70–99)

## 2021-11-25 LAB — CBC WITH DIFFERENTIAL/PLATELET
Abs Immature Granulocytes: 0 10*3/uL (ref 0.00–0.07)
Basophils Absolute: 0 10*3/uL (ref 0.0–0.1)
Basophils Relative: 0 %
Eosinophils Absolute: 0.1 10*3/uL (ref 0.0–0.5)
Eosinophils Relative: 3 %
HCT: 37.5 % — ABNORMAL LOW (ref 39.0–52.0)
Hemoglobin: 12 g/dL — ABNORMAL LOW (ref 13.0–17.0)
Lymphocytes Relative: 42 %
Lymphs Abs: 2 10*3/uL (ref 0.7–4.0)
MCH: 28.2 pg (ref 26.0–34.0)
MCHC: 32 g/dL (ref 30.0–36.0)
MCV: 88 fL (ref 80.0–100.0)
Monocytes Absolute: 0.4 10*3/uL (ref 0.1–1.0)
Monocytes Relative: 8 %
Neutro Abs: 2.2 10*3/uL (ref 1.7–7.7)
Neutrophils Relative %: 47 %
Platelets: 178 10*3/uL (ref 150–400)
RBC: 4.26 MIL/uL (ref 4.22–5.81)
RDW: 15.4 % (ref 11.5–15.5)
WBC: 4.7 10*3/uL (ref 4.0–10.5)
nRBC: 0 % (ref 0.0–0.2)
nRBC: 0 /100 WBC

## 2021-11-25 LAB — TROPONIN I (HIGH SENSITIVITY)
Troponin I (High Sensitivity): 45 ng/L — ABNORMAL HIGH (ref <18)
Troponin I (High Sensitivity): 41 ng/L — ABNORMAL HIGH (ref <18)

## 2021-11-25 LAB — BRAIN NATRIURETIC PEPTIDE: B Natriuretic Peptide: 173.2 pg/mL — ABNORMAL HIGH (ref 0.0–100.0)

## 2021-11-25 LAB — BASIC METABOLIC PANEL
Anion gap: 8 (ref 5–15)
BUN: 23 mg/dL (ref 8–23)
CO2: 24 mmol/L (ref 22–32)
Calcium: 9 mg/dL (ref 8.9–10.3)
Chloride: 109 mmol/L (ref 98–111)
Creatinine, Ser: 1.22 mg/dL (ref 0.61–1.24)
GFR, Estimated: 57 mL/min — ABNORMAL LOW (ref >60)
Glucose, Bld: 96 mg/dL (ref 70–99)
Potassium: 4.1 mmol/L (ref 3.5–5.1)
Sodium: 141 mmol/L (ref 135–145)

## 2021-11-25 LAB — MRSA NEXT GEN BY PCR, NASAL: MRSA by PCR Next Gen: NOT DETECTED

## 2021-11-25 LAB — RESP PANEL BY RT-PCR (FLU A&B, COVID) ARPGX2
Influenza A by PCR: NEGATIVE
Influenza B by PCR: NEGATIVE
SARS Coronavirus 2 by RT PCR: NEGATIVE

## 2021-11-25 LAB — PROTIME-INR
INR: 1 (ref 0.8–1.2)
Prothrombin Time: 13.2 seconds (ref 11.4–15.2)

## 2021-11-25 LAB — TSH: TSH: 0.96 u[IU]/mL (ref 0.350–4.500)

## 2021-11-25 LAB — MAGNESIUM: Magnesium: 2.3 mg/dL (ref 1.7–2.4)

## 2021-11-25 LAB — D-DIMER, QUANTITATIVE: D-Dimer, Quant: 5.87 ug/mL-FEU — ABNORMAL HIGH (ref 0.00–0.50)

## 2021-11-25 MED ORDER — IPRATROPIUM-ALBUTEROL 0.5-2.5 (3) MG/3ML IN SOLN: 3.0000 mL | RESPIRATORY_TRACT | Status: DC | PRN

## 2021-11-25 MED ORDER — HEPARIN BOLUS VIA INFUSION
5500.0000 [IU] | Freq: Once | INTRAVENOUS | Status: AC
  Administered 2021-11-25: 5500 [IU] via INTRAVENOUS
  Filled 2021-11-25: qty 5500

## 2021-11-25 MED ORDER — HEPARIN (PORCINE) 25000 UT/250ML-% IV SOLN
1500.0000 [IU]/h | INTRAVENOUS | Status: DC
  Administered 2021-11-25: 1500 [IU]/h via INTRAVENOUS
  Filled 2021-11-25: qty 250

## 2021-11-25 MED ORDER — DOCUSATE SODIUM 100 MG PO CAPS: 100.0000 mg | ORAL_CAPSULE | Freq: Two times a day (BID) | ORAL | Status: DC | PRN

## 2021-11-25 MED ORDER — ACETAMINOPHEN 325 MG PO TABS
650.0000 mg | ORAL_TABLET | ORAL | Status: DC | PRN
  Administered 2021-11-28: 650 mg via ORAL
  Filled 2021-11-25: qty 2

## 2021-11-25 MED ORDER — ASPIRIN EC 81 MG PO TBEC
81.0000 mg | DELAYED_RELEASE_TABLET | Freq: Every day | ORAL | Status: DC
  Administered 2021-11-26 – 2021-11-27 (×2): 81 mg via ORAL
  Filled 2021-11-25 (×2): qty 1

## 2021-11-25 MED ORDER — FLUTICASONE FUROATE-VILANTEROL 100-25 MCG/ACT IN AEPB
1.0000 | INHALATION_SPRAY | Freq: Every day | RESPIRATORY_TRACT | Status: DC
  Administered 2021-11-26 – 2021-11-27 (×2): 1 via RESPIRATORY_TRACT
  Filled 2021-11-25: qty 28

## 2021-11-25 MED ORDER — UMECLIDINIUM BROMIDE 62.5 MCG/ACT IN AEPB
1.0000 | INHALATION_SPRAY | Freq: Every day | RESPIRATORY_TRACT | Status: DC
  Administered 2021-11-26 – 2021-11-27 (×2): 1 via RESPIRATORY_TRACT
  Filled 2021-11-25: qty 7

## 2021-11-25 MED ORDER — LEVOTHYROXINE SODIUM 25 MCG PO TABS: 50.0000 ug | ORAL_TABLET | Freq: Every day | ORAL | Status: DC

## 2021-11-25 MED ORDER — IOHEXOL 350 MG/ML SOLN
80.0000 mL | Freq: Once | INTRAVENOUS | Status: AC | PRN
  Administered 2021-11-25: 80 mL via INTRAVENOUS

## 2021-11-25 MED ORDER — POLYETHYLENE GLYCOL 3350 17 G PO PACK: 17.0000 g | PACK | Freq: Every day | ORAL | Status: DC | PRN

## 2021-11-25 MED ORDER — FAMOTIDINE 20 MG PO TABS
20.0000 mg | ORAL_TABLET | Freq: Two times a day (BID) | ORAL | Status: DC
  Administered 2021-11-26 – 2021-11-28 (×5): 20 mg via ORAL
  Filled 2021-11-25 (×5): qty 1

## 2021-11-25 MED ORDER — CHLORHEXIDINE GLUCONATE CLOTH 2 % EX PADS
6.0000 | MEDICATED_PAD | Freq: Every day | CUTANEOUS | Status: DC
  Administered 2021-11-25 – 2021-11-26 (×3): 6 via TOPICAL

## 2021-11-25 MED ORDER — ALBUTEROL SULFATE HFA 108 (90 BASE) MCG/ACT IN AERS: 2.0000 | INHALATION_SPRAY | Freq: Four times a day (QID) | RESPIRATORY_TRACT | 11 refills | Status: DC | PRN

## 2021-11-25 NOTE — Progress Notes (Signed)
eLink Physician-Brief Progress Note Patient Name: Dan Freeman DOB: 1931-08-01 MRN: [id number]   Date of Service  11/25/2021  HPI/Events of Note  88 year old male admitted for saddle PE. IR consulted and recommend anticoagulation for now. Echo pending  Camera check: Patient on room air, awake and alert  eICU Interventions  Elink available as needed     Intervention Category Evaluation Type: New Patient Evaluation  Dan Freeman 11/25/2021, 10:03 PM

## 2021-11-25 NOTE — Progress Notes (Signed)
ANTICOAGULATION CONSULT NOTE - Initial Consult  Pharmacy Consult for Heparin Indication: pulmonary embolus  No Known Allergies  Patient Measurements:   Heparin Dosing Weight: 86.9 kg  Vital Signs: Temp: 98 F (36.7 C) (02/10 1654) Temp Source: Oral (02/10 1422) BP: 156/75 (02/10 1845) Pulse Rate: 81 (02/10 1845)  Labs: Recent Labs    11/24/21 0936 11/25/21 1428 11/25/21 1735  HGB 12.1* 12.0*  --   HCT 37.4* 37.5*  --   PLT 186.0 178  --   CREATININE  --  1.22  --   TROPONINIHS  --   --  45*    Estimated Creatinine Clearance: 43.2 mL/min (by C-G formula based on SCr of 1.22 mg/dL).   Medical History: Past Medical History:  Diagnosis Date   Arthritis    Cancer ([place])     prostrate     radiation   COPD (chronic obstructive pulmonary disease) ([place])    Esophageal diverticulum    s/p esophageal resection   Essential hypertension    HOCM (hypertrophic obstructive cardiomyopathy) ([facility])    Not congenital, likely 2/2 uncontrolled HBP   Holosystolic murmur    01/31/39 echo shows LVOT stenosis consistent w/ HOCM and likely 2/2 uncontrolled HBP     Medications:  (Not in a hospital admission)  Scheduled:  Infusions:  PRN:   Assessment: 69 yom presenting from recommendation of PCP to rule out blood clot secondary to concerns for SOB. Heparin per pharmacy consult placed for pulmonary embolus.  CTA PE with acute PE with evidence of RHS consistent with submassive PE.  Patient is not on anticoagulation prior to arrival.  Hgb 12; plt 178  Goal of Therapy:  Heparin level 0.3-0.7 units/ml Monitor platelets by anticoagulation protocol: Yes   Plan:  Give 5500 units bolus x 1 Start heparin infusion at 1500 units/hr Check anti-Xa level in 6 hours and daily while on heparin Continue to monitor H&H and platelets  Lorelei Pont, PharmD, BCPS 11/25/2021 7:27 PM ED Clinical Pharmacist -  [phone]

## 2021-11-25 NOTE — ED Provider Triage Note (Signed)
Emergency Medicine Provider Triage Evaluation Note  Dan Freeman , a 88 y.o. male  was evaluated in triage.  Pt complains of shortness of breath x6 months, progressively worsening.  Worse with exertion.  Denies associated chest pain, cough or URI symptoms.  Does have history of COPD. Patient was called by his doctor and told that he may have a blood clot and to go to the ER.  Denies history of prior PE or DVT.  Seen by PCP yesterday in clinic.  Review of Systems  Positive: Shortness of breath Negative: Fevers, chills, cough, chest pain, lower ext swelling  Physical Exam  There were no vitals taken for this visit. Gen:   Awake, no distress   Resp:  Normal effort  MSK:   Moves extremities without difficulty  Other:  Lungs CTA  Medical Decision Making  Medically screening exam initiated at 2:22 PM.  Appropriate orders placed.  [name] was informed that the remainder of the evaluation will be completed by another provider, this initial triage assessment does not replace that evaluation, and the importance of remaining in the ED until their evaluation is complete.     Tacy Learn, PA-C 11/25/21 1427

## 2021-11-25 NOTE — ED Provider Notes (Signed)
[facility] EMERGENCY DEPARTMENT Provider Note   CSN: [id number] Arrival date & time: 11/25/21  1418     History  Chief Complaint  Patient presents with   Shortness of Breath    Dan Freeman is a 88 y.o. male.  HPI    88 year old male comes in with chief complaint of shortness of breath. Patient has history of COPD, HOCM and was advised to come to the ER by his PCP to rule out blood clots for  Patient indicates that he has been having exertional shortness of breath that has worsened over the last 6 months.  Any activity, including going from 1 room to another now get some short of breath.  He saw his pulmonologist yesterday.  He had some basic work-up and x-ray and he was discharged.  Thereafter today he received a phone call from his PCP advising him to come to the ER to rule out blood clots.  Patient denies any chest pain.  He did have 1 near fainting spell about 2 weeks ago when he was walking.  He also indicates that when he is speaking (he is a [employer] park manager), he feels winded more easily.  He denies any new wheezing, cough.   Pt has no hx of PE, DVT and denies any exogenous hormone (testosterone / estrogen) use, long distance travels or surgery in the past 6 weeks, active cancer, recent immobilization.  Patient has no history of ACS.  Home Medications Prior to Admission medications   Medication Sig Start Date End Date Taking? Authorizing Provider  albuterol (PROAIR HFA) 108 (90 Base) MCG/ACT inhaler Inhale 2 puffs into the lungs every 6 (six) hours as needed for wheezing or shortness of breath. 11/25/21   Hunsucker, Bonna Gains, MD  albuterol (VENTOLIN HFA) 108 (90 Base) MCG/ACT inhaler Inhale 2 puffs into the lungs every 6 (six) hours as needed for shortness of breath. 11/24/21   Hunsucker, Bonna Gains, MD  aspirin EC 81 MG tablet Take 81 mg by mouth daily.    [provider]  b complex vitamins tablet Take 1 tablet by mouth daily.    [provider]   etodolac (LODINE) 400 MG tablet Take 400 mg by mouth daily. 08/15/16   [provider]  Fluticasone-Umeclidin-Vilant (TRELEGY ELLIPTA) 200-62.5-25 MCG/ACT AEPB Inhale 1 puff into the lungs daily. 09/23/21   Hunsucker, Bonna Gains, MD  GINKGO BILOBA EXTRACT PO Take 1 capsule by mouth daily.     [provider]  levothyroxine (SYNTHROID, LEVOTHROID) 50 MCG tablet Take 50 mcg by mouth daily before breakfast. 04/30/17   [provider]  metoprolol succinate (TOPROL-XL) 25 MG 24 hr tablet Take 1 tablet (25 mg total) by mouth daily. Take with or immediately following a meal. 08/12/20 08/07/21  Buford Dresser, MD  Misc Natural Products [facility] COMPLEX PO) Take 1 capsule by mouth daily.     [provider]  tiotropium (SPIRIVA HANDIHALER) 18 MCG inhalation capsule Place 1 capsule (18 mcg total) into inhaler and inhale daily. 06/30/18 08/12/20  Lavina Hamman, MD  vitamin B-12 (CYANOCOBALAMIN) 50 MCG tablet Take 50 mcg by mouth daily.    [provider]      Allergies    Patient has no known allergies.    Review of Systems   Review of Systems  All other systems reviewed and are negative.  Physical Exam Updated Vital Signs BP (!) 156/75    Pulse 81    Temp 98 F (36.7 C)  Resp 17    SpO2 99%  Physical Exam Vitals and nursing note reviewed.  Constitutional:      Appearance: He is well-developed.  HENT:     Head: Atraumatic.  Cardiovascular:     Rate and Rhythm: Normal rate.  Pulmonary:     Effort: Pulmonary effort is normal.     Breath sounds: No wheezing.  Musculoskeletal:     Cervical back: Neck supple.  Skin:    General: Skin is warm.  Neurological:     Mental Status: He is alert and oriented to person, place, and time.    ED Results / Procedures / Treatments   Labs (all labs ordered are listed, but only abnormal results are displayed) Labs Reviewed  BASIC METABOLIC PANEL - Abnormal; Notable for the following components:       Result Value   GFR, Estimated 57 (*)    All other components within normal limits  CBC WITH DIFFERENTIAL/PLATELET - Abnormal; Notable for the following components:   Hemoglobin 12.0 (*)    HCT 37.5 (*)    All other components within normal limits  BRAIN NATRIURETIC PEPTIDE - Abnormal; Notable for the following components:   B Natriuretic Peptide 173.2 (*)    All other components within normal limits  TROPONIN I (HIGH SENSITIVITY) - Abnormal; Notable for the following components:   Troponin I (High Sensitivity) 45 (*)    All other components within normal limits  PROTIME-INR  HEPARIN LEVEL (UNFRACTIONATED)  HEPARIN LEVEL (UNFRACTIONATED)  TROPONIN I (HIGH SENSITIVITY)    EKG EKG Interpretation  Date/Time:  Friday November 25 2021 14:27:37 EST Ventricular Rate:  64 PR Interval:  288 QRS Duration: 136 QT Interval:  472 QTC Calculation: 486 R Axis:   -71 Text Interpretation: Sinus rhythm with 1st degree A-V block Right bundle branch block -not new Left anterior fascicular block - not new Minimal voltage criteria for LVH, may be normal variant ( R in aVL ) Lateral infarct , age undetermined Abnormal ECG No significant change since last tracing Confirmed by Varney Biles [phone]) on 11/25/2021 5:20:38 PM  Radiology DG Chest 2 View  Result Date: 11/25/2021 CLINICAL DATA:  Shortness of breath EXAM: CHEST - 2 VIEW COMPARISON:  06/27/2018 FINDINGS: Hyperinflation. Lateral view degraded by patient arm position. Midline trachea. Mild cardiomegaly. Atherosclerosis in the transverse aorta. Pleuroparenchymal scarring at the inferior left hemithorax. Calcified granuloma in the left apex. EKG lead projects over the left upper lobe on the frontal radiograph. IMPRESSION: Hyperinflation, without acute disease. Aortic Atherosclerosis (ICD10-I70.0). Electronically Signed   By: Abigail Miyamoto M.D.   On: 11/25/2021 15:06   CT Angio Chest PE W/Cm &/Or Wo Cm  Result Date: 11/25/2021 CLINICAL DATA:  Concern  for pulmonary embolism. EXAM: CT ANGIOGRAPHY CHEST WITH CONTRAST TECHNIQUE: Multidetector CT imaging of the chest was performed using the standard protocol during bolus administration of intravenous contrast. Multiplanar CT image reconstructions and MIPs were obtained to evaluate the vascular anatomy. RADIATION DOSE REDUCTION: This exam was performed according to the departmental dose-optimization program which includes automated exposure control, adjustment of the mA and/or kV according to patient size and/or use of iterative reconstruction technique. CONTRAST:  59mL OMNIPAQUE IOHEXOL 350 MG/ML SOLN COMPARISON:  Chest CT dated 05/20/2018. FINDINGS: Cardiovascular: There is no cardiomegaly or pericardial effusion. There is mild atherosclerotic calcification of the thoracic aorta. No aneurysmal dilatation. There is a saddle embolus straddling the bifurcation of the pulmonary trunk with standing into the central, and lobar pulmonary artery branches bilaterally. The  risks evidence of right heart straining with RV/LV ratio in the range of 1.0-1.25. Mediastinum/Nodes: No hilar or mediastinal adenopathy. The esophagus is grossly unremarkable no mediastinal fluid collection. Lungs/Pleura: A 1 cm calcified left upper lobe granuloma. Bibasilar linear atelectasis/scarring. No focal consolidation, pleural effusion, or pneumothorax. The central airways are patent. Upper Abdomen: No acute abnormality. Musculoskeletal: Old healed left posterior rib fractures. No acute osseous pathology. Prior left rib resection. Review of the MIP images confirms the above findings. IMPRESSION: Positive for acute PE with of right heart strain (RV/LV Ratio = 1.0-1.2) consistent with at least submassive (intermediate risk) PE. The presence of right heart strain has been associated with an increased risk of morbidity and mortality. Aortic Atherosclerosis (ICD10-I70.0). These results were called by telephone at the time of interpretation on 11/25/2021  at 7:20 pm to provider [facility] , who verbally acknowledged these results. Electronically Signed   By: Anner Crete M.D.   On: 11/25/2021 19:27    Procedures .Critical Care Performed by: Varney Biles, MD Authorized by: Varney Biles, MD   Critical care provider statement:    Critical care time (minutes):  90   Critical care was necessary to treat or prevent imminent or life-threatening deterioration of the following conditions:  Circulatory failure and respiratory failure   Critical care was time spent personally by me on the following activities:  Development of treatment plan with patient or surrogate, discussions with consultants, evaluation of patient's response to treatment, examination of patient, ordering and review of laboratory studies, ordering and review of radiographic studies, ordering and performing treatments and interventions, pulse oximetry, re-evaluation of patient's condition and review of old charts    Medications Ordered in ED Medications  heparin ADULT infusion 100 units/mL (25000 units/237mL) (has no administration in time range)  heparin bolus via infusion 5,500 Units (has no administration in time range)  iohexol (OMNIPAQUE) 350 MG/ML injection 80 mL (80 mLs Intravenous Contrast Given 11/25/21 1908)    ED Course/ Medical Decision Making/ A&P Clinical Course as of 11/25/21 1935  Fri Nov 25, 2021  1758 EKG 12-Lead No acute changes compared to previous EKG. [AN]  0272 DG Chest 2 View X-ray did not indicate any evidence of pneumothorax, pulmonary edema or large pleural effusion [AN]  1758 Hemoglobin(!): 12.0 Anemia is mild, not the culprit for the shortness of breath [AN]  1758 Brain natriuretic peptide(!) Slightly elevated, however lower than previous BNP.  Nonspecific in this case. [AN]  08/25/33 CT Angio Chest PE W/Cm &/Or Wo Cm Received a call from radiology.  Patient has saddle pulmonary embolism with right-sided heart strain.  Results discussed  with the patient.  Heparin ordered. [AN]    Clinical Course User Index [AN] Varney Biles, MD                           Medical Decision Making Amount and/or Complexity of Data Reviewed Labs:  Decision-making details documented in ED Course. Radiology: ordered. Decision-making details documented in ED Course. ECG/medicine tests:  Decision-making details documented in ED Course.  Risk Prescription drug management. Decision regarding hospitalization.    This patient presents to the ED with chief complaint(s) of shortness of breath with pertinent past medical history of HOCM and COPD which further complicates the presenting complaint. The complaint involves an extensive differential diagnosis and treatment options and also carries with it a high risk of complications and morbidity.    The differential diagnosis includes  ACS syndrome Aortic  dissection CHF exacerbation Valvular disorder /worsening structural disease of the heart Pericardial effusion / tamponade Pleural effusion / Pulmonary edema COPD exacerbation PE Severe anemia Arrhythmia (tachybradycardia syndrome, PSVT, A-fib)  I reviewed patient's encounter with his pulmonologist yesterday and cardiologist in 2021.  I also reviewed patient's echocardiogram which revealed HOCM with preserved EF.  I reviewed patient's work-up that was completed today.  His exam is overall reassuring.  The initial plan is to order CT PE, and add troponins. I have also ordered ambulatory pulse ox.  My pretest probability for pulmonary embolism is lower than cardiac etiology such as worsening structural disease of the heart or new valvular issues.  CHF secondary to structural changes to the heart is also possible.  The other possibility includes arrhythmia.  Patient's heart rate has been in the 40s and 50s while in the ED. His heart rate with the cardiology visits were in the 66s.   Additional history obtained: Additional history obtained  from family Records reviewed Care Everywhere/External Records  Consultations Obtained: I requested consultation with the admitting physician -hospitalist and consultant pulmonary critical care , and discussed  findings as well as pertinent plan - they recommend: Admission to the hospital.  Per pulmonary critical care, not a candidate for IV tPA.  Medicines ordered and prescription drug management: I ordered the following medications heparin for pulmonary embolism   Reevaluation of the patient after these medicines showed that the patient    stayed the same   Cardiac Monitoring: The patient was maintained on a cardiac monitor.  I personally viewed and interpreted the cardiac monitor which showed an underlying rhythm of:  sinus bradycardia  Complexity of problems addressed: Patients presentation is most consistent with  acute presentation with potential threat to life or bodily function During patient's assessment  Disposition: After consideration of the diagnostic results and the patients response to treatment,  I feel that the patent would benefit from admission to medicine .    Final Clinical Impression(s) / ED Diagnoses Final diagnoses:  Exertional dyspnea  Acute saddle pulmonary embolism with acute cor pulmonale [facility])    Rx / DC Orders ED Discharge Orders     None         Varney Biles, MD 11/25/21 1935

## 2021-11-25 NOTE — Plan of Care (Signed)

## 2021-11-25 NOTE — H&P (Signed)
NAME:  Dan Freeman, MRN:  [id number], DOB:  [birth year], LOS: 0 ADMISSION DATE:  11/25/2021, CONSULTATION DATE:  2/10 REFERRING MD:  Dr. Kathrynn Humble, CHIEF COMPLAINT:  saddle PE   History of Present Illness:  Patient is a 88 year old M with pertinent PMH of COPD/asthma, HOCM, HTN presents to [facility] on 2/10 with SOB. Patient states he has been having exertional SOB which has worsened over the last 6 months.  Patient seen most recently by outpatient pulmonary, Dr. Silas Flood, on 2/9.  Patient's inhaler increased to Trelegy given uncontrolled symptoms with as needed albuterol.  Patient states about 2 weeks ago he had near syncope w/ walking.  Patient states his SOB is worsened with exertion.  He called PCP on 2/10 who recommended to go to ER to rule out blood clots. On 2/10 presents to [facility] ED.  On room air satting 98% but desats to 90% with ambulation. In ER at admit had saddle PE with RV / LV  > 1.0, mild increase in troponin and bnp. But BP normal, HR normal, Pulse ox at rest normal and normal mentations.  PE risk factors  -remote hx of prostate cancer under remission per hx  -no family hx of PE - no sedendary change though in oct 2022 had herniated lumbar disc - no testosterone Rx - non smoker - no known active cancer but jan 2023 had PAIn in left testicle at doc visit -   PESI score - class 4 on basis of age, male, HOCM, lung disease nos (moderate- severe) BOVA score 2 points, intermediate risk  Contraindications for lysis  - none apparent other than age - no rcent   Home  Pertinent  Medical History    has a past medical history of Arthritis, Cancer ([place]), COPD (chronic obstructive pulmonary disease) ([place]), Esophageal diverticulum, Essential hypertension, HOCM (hypertrophic obstructive cardiomyopathy) ([place]), and Holosystolic murmur.   reports that he has never smoked. He has never used smokeless tobacco.  Past Surgical History:  Procedure Laterality Date   ESOPHAGECTOMY     SHOULDER  ARTHROSCOPY  01/05/2012   Procedure: ARTHROSCOPY SHOULDER;  Surgeon: Sharmon Revere, MD;  Location: [facility];  Service: Orthopedics;  Laterality: Right;  acromioplasty    No Known Allergies  Immunization History  Administered Date(s) Administered   Influenza, High Dose Seasonal PF 10/14/2014, 07/29/2019   Influenza,inj,Quad PF,6+ Mos 08/14/2016, 09/07/2017, 07/08/2018   Influenza-Unspecified 09/30/2008, 10/25/2009, 07/26/2010, 08/15/2011, 08/28/2013, 07/25/2015, 08/14/2016, 08/21/2021   Pneumococcal Conjugate-13 08/14/2016   Pneumococcal Polysaccharide-23 08/02/2011    Family History  Problem Relation Age of Onset   Diabetes Mellitus II Sister    Cancer Brother      Current Facility-Administered Medications:    acetaminophen (TYLENOL) tablet 650 mg, 650 mg, Oral, Q4H PRN, Mick Sell, PA-C   [START ON 11/26/2021] aspirin EC tablet 81 mg, 81 mg, Oral, Daily, Payne, John D, PA-C   docusate sodium (COLACE) capsule 100 mg, 100 mg, Oral, BID PRN, Mick Sell, PA-C   [START ON 11/26/2021] famotidine (PEPCID) tablet 20 mg, 20 mg, Oral, BID, Payne, John D, PA-C   [START ON 11/26/2021] fluticasone furoate-vilanterol (BREO ELLIPTA) 100-25 MCG/ACT 1 puff, 1 puff, Inhalation, Daily, Rollene Rotunda, John D, PA-C   heparin ADULT infusion 100 units/mL (25000 units/242mL), 1,500 Units/hr, Intravenous, Continuous, Heloise Purpura, [facility], Last Rate: 15 mL/hr at 11/25/21 1949, 1,500 Units/hr at 11/25/21 1949   ipratropium-albuterol (DUONEB) 0.5-2.5 (3) MG/3ML nebulizer solution 3 mL, 3 mL, Nebulization, Q4H PRN, Mick Sell, PA-C   [  START ON 11/26/2021] levothyroxine (SYNTHROID) tablet 50 mcg, 50 mcg, Oral, QAC breakfast, Rollene Rotunda, John D, PA-C   polyethylene glycol (MIRALAX / GLYCOLAX) packet 17 g, 17 g, Oral, Daily PRN, Mick Sell, PA-C   [START ON 11/26/2021] umeclidinium bromide (INCRUSE ELLIPTA) 62.5 MCG/ACT 1 puff, 1 puff, Inhalation, Daily, Mick Sell, PA-C  Current Outpatient Medications:     albuterol (PROAIR HFA) 108 (90 Base) MCG/ACT inhaler, Inhale 2 puffs into the lungs every 6 (six) hours as needed for wheezing or shortness of breath., Disp: 1 each, Rfl: 11   albuterol (VENTOLIN HFA) 108 (90 Base) MCG/ACT inhaler, Inhale 2 puffs into the lungs every 6 (six) hours as needed for shortness of breath., Disp: 1 each, Rfl: 11   aspirin EC 81 MG tablet, Take 81 mg by mouth daily., Disp: , Rfl:    b complex vitamins tablet, Take 1 tablet by mouth daily., Disp: , Rfl:    etodolac (LODINE) 400 MG tablet, Take 400 mg by mouth daily., Disp: , Rfl:    Fluticasone-Umeclidin-Vilant (TRELEGY ELLIPTA) 200-62.5-25 MCG/ACT AEPB, Inhale 1 puff into the lungs daily., Disp: 60 each, Rfl: 3   GINKGO BILOBA EXTRACT PO, Take 1 capsule by mouth daily. , Disp: , Rfl:    levothyroxine (SYNTHROID, LEVOTHROID) 50 MCG tablet, Take 50 mcg by mouth daily before breakfast., Disp: , Rfl:    metoprolol succinate (TOPROL-XL) 25 MG 24 hr tablet, Take 1 tablet (25 mg total) by mouth daily. Take with or immediately following a meal., Disp: 90 tablet, Rfl: 3   Misc Natural Products (GINSENG COMPLEX PO), Take 1 capsule by mouth daily. , Disp: , Rfl:    tiotropium (SPIRIVA HANDIHALER) 18 MCG inhalation capsule, Place 1 capsule (18 mcg total) into inhaler and inhale daily., Disp: 30 capsule, Rfl: 2   vitamin B-12 (CYANOCOBALAMIN) 50 MCG tablet, Take 50 mcg by mouth daily., Disp: , Rfl:     Significant Hospital Events: Including procedures, antibiotic start and stop dates in addition to other pertinent events   11/25/2021 - admit from [place]  Interim History / Subjective:  11/25/2021 0 seen in [place] bed 03  Objective   Blood pressure (!) 156/75, pulse 81, temperature 98 F (36.7 C), resp. rate 17, SpO2 99 %.       No intake or output data in the 24 hours ending 11/25/21 1948 There were no vitals filed for this visit.  Examination: General: well built no distres HENT: Not on oxygen.  No neck nodes no elevated  JVP Lungs: Not on distress.  Clear to auscultation bilaterally Cardiovascular: Regular rate and rhythm normal heart sounds no murmurs Abdomen: Soft no organomegaly nontender Extremities: No sinus no clubbing no edema.  Extremities intact without any swelling or trauma Neuro: Alert and oriented x3.  Speech is normal.  Moves all 4 extremities well.  Good power and tone. GU: Not examined    Resolved Hospital Problem list     Assessment & Plan:  Saddle PE w/ R heart strain: RV/LV ratio 1-1.2; BNP 173 - Class 4 Submassive, BOVA score 2 intermeidate risk - Prior to & Present on Admit  P: -will admit to ICU for closer observation and continuous telemetry -callled IR for possible thrombectomy - d./w Dr Earleen Newport who will see patient -heparin gtt -echo/dvt US ordered - stat -trend troponin, lactate, bnp - systemic lysis if suddenly decompensates ahead of thrombectomy or thrombectomy not an option (family counseled) - NPO except clear  Hx of pre=syncope - Prior  to & Present on Admit   - possibly due to HOCM and submassive PE -neuro intact  Plan  - ICu monitoring - get CT head     Hx of COPD/asthma: pt of Dr. Silas Flood -takes trelegy ellipta P: -incruse ellipta and breo ellipta ordered -prn duonebs for wheezing  Hx HTN HOCM P: -hold Home metoprolol  -continue ASA - await echo  Hx of arthritis P: -hold NSAIDs -prn tylenol   Hx of remote prosate cancer  Plan  - check PSA  On synthroid but outside chart says hx of subclinical hyperthyroidism with TSH below normal Nov 2022  Plan - check TSH  -hold synthroid  History of testicular pain Jan 2023 - none currently. Rx with cipro.   Plan  -check [place] testes  Hx of chronic anemia NOS - hgb 11-12gm% - Prior to & Present on Admit  Plan  - - PRBC for hgb </= 6.9gm%    - exceptions are   -  if ACS susepcted/confirmed then transfuse for hgb </= 8.0gm%,  or    -  active bleeding with hemodynamic instability, then  transfuse regardless of hemoglobin value   At at all times try to transfuse 1 unit prbc as possible with exception of active hemorrhage   Best Practice (right click and "Reselect all SmartList Selections" daily)   Diet/type: NPO w/ oral meds DVT prophylaxis: systemic heparin GI prophylaxis: H2B Lines: N/A Foley:  N/A Code Status:  full code Last date of multidisciplinary goals of care discussion [daughter Thayer Headings an RN, patient and wife at bedside in ER - they are very keen on thrombectomy]    ATTESTATION & SIGNATURE   The patient ADD DINAPOLI is critically ill with multiple organ systems failure and requires high complexity decision making for assessment and support, frequent evaluation and titration of therapies, application of advanced monitoring technologies and extensive interpretation of multiple databases.   Critical Care Time devoted to patient care services described in this note is  60  Minutes. This time reflects time of care of this signee Dr Brand Males. This critical care time does not reflect procedure time, or teaching time or supervisory time of PA/NP/Med student/Med Resident etc but could involve care discussion time     Dr. Brand Males, M.D., [facility].C.P Pulmonary and Critical Care Medicine Staff Physician [place] Pager: [phone], If no answer or between  15:00h - 7:00h: call [phone]  11/25/2021 8:41 PM   LABS    PULMONARY No results for input(s): PHART, PCO2ART, PO2ART, HCO3, TCO2, O2SAT in the last 168 hours.  Invalid input(s): PCO2, PO2  CBC Recent Labs  Lab 11/24/21 0936 11/25/21 1428  HGB 12.1* 12.0*  HCT 37.4* 37.5*  WBC 4.1 4.7  PLT 186.0 178    COAGULATION Recent Labs  Lab 11/25/21 1935  INR 1.0    CARDIAC  No results for input(s): TROPONINI in the last 168 hours. No results for input(s): PROBNP in the last 168 hours.   CHEMISTRY Recent Labs  Lab 11/25/21 1428   NA 141  K 4.1  CL 109  CO2 24  GLUCOSE 96  BUN 23  CREATININE 1.22  CALCIUM 9.0   Estimated Creatinine Clearance: 43.2 mL/min (by C-G formula based on SCr of 1.22 mg/dL).   LIVER Recent Labs  Lab 11/25/21 1935  INR 1.0     INFECTIOUS No results for input(s): LATICACIDVEN, PROCALCITON in the last 168 hours.  ENDOCRINE CBG (last 3)  No results for input(s): GLUCAP in the last 72 hours.       IMAGING x48h  - image(s) personally visualized  -   highlighted in bold DG Chest 2 View  Result Date: 11/25/2021 CLINICAL DATA:  Shortness of breath EXAM: CHEST - 2 VIEW COMPARISON:  06/27/2018 FINDINGS: Hyperinflation. Lateral view degraded by patient arm position. Midline trachea. Mild cardiomegaly. Atherosclerosis in the transverse aorta. Pleuroparenchymal scarring at the inferior left hemithorax. Calcified granuloma in the left apex. EKG lead projects over the left upper lobe on the frontal radiograph. IMPRESSION: Hyperinflation, without acute disease. Aortic Atherosclerosis (ICD10-I70.0). Electronically Signed   By: Abigail Miyamoto M.D.   On: 11/25/2021 15:06   CT Angio Chest PE W/Cm &/Or Wo Cm  Result Date: 11/25/2021 CLINICAL DATA:  Concern for pulmonary embolism. EXAM: CT ANGIOGRAPHY CHEST WITH CONTRAST TECHNIQUE: Multidetector CT imaging of the chest was performed using the standard protocol during bolus administration of intravenous contrast. Multiplanar CT image reconstructions and MIPs were obtained to evaluate the vascular anatomy. RADIATION DOSE REDUCTION: This exam was performed according to the departmental dose-optimization program which includes automated exposure control, adjustment of the mA and/or kV according to patient size and/or use of iterative reconstruction technique. CONTRAST:  51mL OMNIPAQUE IOHEXOL 350 MG/ML SOLN COMPARISON:  Chest CT dated 05/20/2018. FINDINGS: Cardiovascular: There is no cardiomegaly or pericardial effusion. There is mild atherosclerotic  calcification of the thoracic aorta. No aneurysmal dilatation. There is a saddle embolus straddling the bifurcation of the pulmonary trunk with standing into the central, and lobar pulmonary artery branches bilaterally. The risks evidence of right heart straining with RV/LV ratio in the range of 1.0-1.25. Mediastinum/Nodes: No hilar or mediastinal adenopathy. The esophagus is grossly unremarkable no mediastinal fluid collection. Lungs/Pleura: A 1 cm calcified left upper lobe granuloma. Bibasilar linear atelectasis/scarring. No focal consolidation, pleural effusion, or pneumothorax. The central airways are patent. Upper Abdomen: No acute abnormality. Musculoskeletal: Old healed left posterior rib fractures. No acute osseous pathology. Prior left rib resection. Review of the MIP images confirms the above findings. IMPRESSION: Positive for acute PE with of right heart strain (RV/LV Ratio = 1.0-1.2) consistent with at least submassive (intermediate risk) PE. The presence of right heart strain has been associated with an increased risk of morbidity and mortality. Aortic Atherosclerosis (ICD10-I70.0). These results were called by telephone at the time of interpretation on 11/25/2021 at 7:20 pm to provider [facility] , who verbally acknowledged these results. Electronically Signed   By: Anner Crete M.D.   On: 11/25/2021 19:27

## 2021-11-25 NOTE — ED Notes (Signed)
Saturation on RA dropped from 98% to 90% with ambulation down the hall and back

## 2021-11-25 NOTE — Telephone Encounter (Signed)
Called to inform Dan Freeman of new prescription sent. No questions noted. Nothing further needed

## 2021-11-25 NOTE — ED Triage Notes (Signed)
Patient states he was sent to ED by PCP for evaluation of possible blood clot, patient states he has been short of breath for the last six months. Patient alert, oriented, speaking in complete sentences, room air SpO2 97%.

## 2021-11-25 NOTE — Telephone Encounter (Signed)
Hey Dr Silas Flood I was just wondering if it was okay for me to change the medication Ventolin to ProAir. Per patients pharmacy   Pt's pharmacy [place] [phone], request a change in inhaler FROM Ventolin HFA TO Proair. Although they are interchangeable, they are requesting a new script written for Proair.

## 2021-11-25 NOTE — Telephone Encounter (Signed)
Yes that is fine - new script sent.

## 2021-11-25 NOTE — Consult Note (Signed)
Chief Complaint: SOB, PE  Referring Physician(s): Dr. Chase Caller  Supervising Physician: Corrie Mckusick  Patient Status: [facility] - ED  History of Present Illness: Dan Freeman is a 88 y.o. male presenting to [facility] with worsening SOB, apparently sent by his physician for evaluation of possible VTE given his complaints.   He tells me that he has had SOB for at least 1 year, however, has been worsening over the past few weeks to months.  In particular, he describes a near-syncopal event about 2 weeks ago, and he continues to have symptoms worse with activity.  He was seen yesterday by his pulmonary team.  He denies any current chest pain at rest, and tells me that he has not noticed any chest pain in the past few weeks.    He denies any known prior MI.  He does have a history of pulmonary issues, including COPD, and thus sPESI = 1.    His wife tells me that "they cant keep him to stay still", and he clearly is otherwise very active.  He continues to enjoy working odd jobs.    He denies any history of PE/VTE.  He has not had any long trips recently.  He is not being treated for cancer, having remote prostate cancer therapy about 10-12 years ago, and he tells me he is in remission.  He denies any recent hospitalizations or episodes of bed-bound.    He denies any recent stroke, recent surgery, denies any GI bleeding.  He has no known brain tumor.   Pertinent labs:  Trop I: 45 --> 41  BNP: 173 Cr: 1.2 BUN: 23 eGFR: 57  COVID: neg FLU A: neg FLUB: neg  Vital Signs: SBP: 128 - 156 (no hypotension) HR: 55-100 O2: 96-99% on room air afebrile  CTA:           Left PA: Non-occlusive lobar, lower segmental/subsegmental Right PA: No lobar.  Non-occlusive segmental/subsegmental of the lower lobe.   Axis of heart and the hypertrophic left ventricle changes make estimation of the ratio difficult.  My estimated ratio is ~1.0.    Past Medical History:  Diagnosis Date    Arthritis    Cancer ([place])     prostrate     radiation   COPD (chronic obstructive pulmonary disease) ([place])    Esophageal diverticulum    s/p esophageal resection   Essential hypertension    HOCM (hypertrophic obstructive cardiomyopathy) (HCC)    Not congenital, likely 2/2 uncontrolled HBP   Holosystolic murmur    1/84/03 echo shows LVOT stenosis consistent w/ HOCM and likely 2/2 uncontrolled HBP     Past Surgical History:  Procedure Laterality Date   ESOPHAGECTOMY     SHOULDER ARTHROSCOPY  01/05/2012   Procedure: ARTHROSCOPY SHOULDER;  Surgeon: Sharmon Revere, MD;  Location: [facility];  Service: Orthopedics;  Laterality: Right;  acromioplasty    Allergies: Patient has no known allergies.  Medications: Prior to Admission medications   Medication Sig Start Date End Date Taking? Authorizing Provider  albuterol (PROAIR HFA) 108 (90 Base) MCG/ACT inhaler Inhale 2 puffs into the lungs every 6 (six) hours as needed for wheezing or shortness of breath. 11/25/21   Hunsucker, Bonna Gains, MD  albuterol (VENTOLIN HFA) 108 (90 Base) MCG/ACT inhaler Inhale 2 puffs into the lungs every 6 (six) hours as needed for shortness of breath. 11/24/21   Hunsucker, Bonna Gains, MD  aspirin EC 81 MG tablet Take 81 mg by mouth daily.    [provider]  b complex vitamins tablet Take 1 tablet by mouth daily.    [provider]  etodolac (LODINE) 400 MG tablet Take 400 mg by mouth daily. 08/15/16   [provider]  Fluticasone-Umeclidin-Vilant (TRELEGY ELLIPTA) 200-62.5-25 MCG/ACT AEPB Inhale 1 puff into the lungs daily. 09/23/21   Hunsucker, Bonna Gains, MD  GINKGO BILOBA EXTRACT PO Take 1 capsule by mouth daily.     [provider]  levothyroxine (SYNTHROID, LEVOTHROID) 50 MCG tablet Take 50 mcg by mouth daily before breakfast. 04/30/17   [provider]  metoprolol succinate (TOPROL-XL) 25 MG 24 hr tablet Take 1 tablet (25 mg total) by mouth daily. Take with or immediately  following a meal. 08/12/20 08/07/21  Buford Dresser, MD  Misc Natural Products [facility] COMPLEX PO) Take 1 capsule by mouth daily.     [provider]  tiotropium (SPIRIVA HANDIHALER) 18 MCG inhalation capsule Place 1 capsule (18 mcg total) into inhaler and inhale daily. 06/30/18 08/12/20  Lavina Hamman, MD  vitamin B-12 (CYANOCOBALAMIN) 50 MCG tablet Take 50 mcg by mouth daily.    [provider]     Family History  Problem Relation Age of Onset   Diabetes Mellitus II Sister    Cancer Brother     Social History   Socioeconomic History   Marital status: Married    Spouse name: Not on file   Number of children: Not on file   Years of education: Not on file   Highest education level: Not on file  Occupational History   Not on file  Tobacco Use   Smoking status: Never   Smokeless tobacco: Never  Substance and Sexual Activity   Alcohol use: No   Drug use: No   Sexual activity: Not on file  Other Topics Concern   Not on file  Social History Narrative   Not on file   Social Determinants of Health   Financial Resource Strain: Not on file  Food Insecurity: Not on file  Transportation Needs: Not on file  Physical Activity: Not on file  Stress: Not on file  Social Connections: Not on file      Review of Systems: A 12 point ROS discussed and pertinent positives are indicated in the HPI above.  All other systems are negative.  Review of Systems  Vital Signs: BP (!) 145/68    Pulse (!) 55    Temp 98 F (36.7 C)    Resp (!) 24    SpO2 96%   Physical Exam General: 88 yo male appearing stated age.  Well-developed, well-nourished.  No distress. HEENT: Atraumatic, normocephalic.  Glasses. Conjugate gaze, extra-ocular motor intact. No scleral icterus or scleral injection. No lesions on external ears, nose, lips, or gums.     Neck: Symmetric with no goiter enlargement.  Chest/Lungs:  Symmetric chest with inspiration/expiration.  No labored breathing.   Clear to auscultation with no wheezes, rhonchi, or rales.  Heart:  RRR, with no third heart sounds appreciated. No JVD appreciated.  Abdomen:  Soft, NT/ND, with + bowel sounds.   Genito-urinary: Deferred Neurologic: Alert & Oriented to person, place, and time.   Normal affect and insight.  Appropriate questions.  Moving all 4 extremities with gross sensory intact.  Pulse Exam:  No bruit appreciated.  Palpable right popliteal artery & left popliteal artery.   Extremities:No warmth, redness, or swelling.  No pitting edema.    Imaging: DG Chest 2 View  Result Date: 11/25/2021 CLINICAL DATA:  Shortness of breath EXAM: CHEST - 2 VIEW COMPARISON:  06/27/2018 FINDINGS: Hyperinflation. Lateral view degraded by patient arm position. Midline trachea. Mild cardiomegaly. Atherosclerosis in the transverse aorta. Pleuroparenchymal scarring at the inferior left hemithorax. Calcified granuloma in the left apex. EKG lead projects over the left upper lobe on the frontal radiograph. IMPRESSION: Hyperinflation, without acute disease. Aortic Atherosclerosis (ICD10-I70.0). Electronically Signed   By: Abigail Miyamoto M.D.   On: 11/25/2021 15:06   CT Angio Chest PE W/Cm &/Or Wo Cm  Result Date: 11/25/2021 CLINICAL DATA:  Concern for pulmonary embolism. EXAM: CT ANGIOGRAPHY CHEST WITH CONTRAST TECHNIQUE: Multidetector CT imaging of the chest was performed using the standard protocol during bolus administration of intravenous contrast. Multiplanar CT image reconstructions and MIPs were obtained to evaluate the vascular anatomy. RADIATION DOSE REDUCTION: This exam was performed according to the departmental dose-optimization program which includes automated exposure control, adjustment of the mA and/or kV according to patient size and/or use of iterative reconstruction technique. CONTRAST:  71m OMNIPAQUE IOHEXOL 350 MG/ML SOLN COMPARISON:  Chest CT dated 05/20/2018. FINDINGS: Cardiovascular: There is no cardiomegaly or  pericardial effusion. There is mild atherosclerotic calcification of the thoracic aorta. No aneurysmal dilatation. There is a saddle embolus straddling the bifurcation of the pulmonary trunk with standing into the central, and lobar pulmonary artery branches bilaterally. The risks evidence of right heart straining with RV/LV ratio in the range of 1.0-1.25. Mediastinum/Nodes: No hilar or mediastinal adenopathy. The esophagus is grossly unremarkable no mediastinal fluid collection. Lungs/Pleura: A 1 cm calcified left upper lobe granuloma. Bibasilar linear atelectasis/scarring. No focal consolidation, pleural effusion, or pneumothorax. The central airways are patent. Upper Abdomen: No acute abnormality. Musculoskeletal: Old healed left posterior rib fractures. No acute osseous pathology. Prior left rib resection. Review of the MIP images confirms the above findings. IMPRESSION: Positive for acute PE with of right heart strain (RV/LV Ratio = 1.0-1.2) consistent with at least submassive (intermediate risk) PE. The presence of right heart strain has been associated with an increased risk of morbidity and mortality. Aortic Atherosclerosis (ICD10-I70.0). These results were called by telephone at the time of interpretation on 11/25/2021 at 7:20 pm to [facility], who verbally acknowledged these results. Electronically Signed   By: AAnner CreteM.D.   On: 11/25/2021 19:27    Labs:  CBC: Recent Labs    11/24/21 0936 11/25/21 1428  WBC 4.1 4.7  HGB 12.1* 12.0*  HCT 37.4* 37.5*  PLT 186.0 178    COAGS: Recent Labs    11/25/21 1935  INR 1.0    BMP: Recent Labs    11/25/21 1428  NA 141  K 4.1  CL 109  CO2 24  GLUCOSE 96  BUN 23  CALCIUM 9.0  CREATININE 1.22  GFRNONAA 57*    LIVER FUNCTION TESTS: No results for input(s): BILITOT, AST, ALT, ALKPHOS, PROT, ALBUMIN in the last 8760 hours.  TUMOR MARKERS: No results for input(s): AFPTM, CEA, CA199, CHROMGRNA in the last 8760  hours.  Assessment and Plan:  Mr QHofferberis a very pleasant 88[facility] PE, with saddle configuration.   Based on his normal vital signs, positive troponin and BNP, as well as the RV/LV ratio of ~1.0, he falls into the submassive category of PE.  I do think the ratio is difficult to measure for him, as he has some hypertrophic changes of the LV, and the axis is shifted on the CT.  ECHO is pending at this time.   The absolute volume of PE is not very large, despite the saddle configuration.    I had a lengthy conversation with Mr Mcnay, his wife, and their daughter, Dan Freeman (cell phone) regarding the diagnosis of VTE/PE, standard of care therapy, the pathophysiology of PE, particularly submassive PE, advanced care such as possible thrombolysis/thrombolytics, and the logic of treatment.   I did emphasize, based on his vital signs and the overall low absolute volume of PE, that we should wait for the ECHO results before making any final determination of further treatment.    At this point, my impression would be to continue heparin therapy, complete the ECHO and reassess in the am.  VIR is available should there be any change in clinic status overnight.  They understand.    Please call with any concerns/questions or changes.     Thank you for this interesting consult.  I greatly enjoyed meeting Dan Freeman and look forward to participating in their care.  A copy of this report was sent to the requesting provider on this date.  Electronically Signed: Corrie Mckusick, DO 11/25/2021, 9:20 PM   I spent a total of 55 Miinutes    in face to face in clinical consultation, greater than 50% of which was counseling/coordinating care for PE, submassive category, possible intervention.

## 2021-11-26 ENCOUNTER — Inpatient Hospital Stay (HOSPITAL_COMMUNITY): Payer: Medicare Other

## 2021-11-26 DIAGNOSIS — I2699 Other pulmonary embolism without acute cor pulmonale: Secondary | ICD-10-CM

## 2021-11-26 DIAGNOSIS — I2609 Other pulmonary embolism with acute cor pulmonale: Secondary | ICD-10-CM

## 2021-11-26 LAB — CBC
HCT: 39 % (ref 39.0–52.0)
Hemoglobin: 12.3 g/dL — ABNORMAL LOW (ref 13.0–17.0)
MCH: 27.3 pg (ref 26.0–34.0)
MCHC: 31.5 g/dL (ref 30.0–36.0)
MCV: 86.7 fL (ref 80.0–100.0)
Platelets: 190 10*3/uL (ref 150–400)
RBC: 4.5 MIL/uL (ref 4.22–5.81)
RDW: 15.4 % (ref 11.5–15.5)
WBC: 4.9 10*3/uL (ref 4.0–10.5)
nRBC: 0 % (ref 0.0–0.2)

## 2021-11-26 LAB — COMPREHENSIVE METABOLIC PANEL
ALT: 12 U/L (ref 0–44)
AST: 15 U/L (ref 15–41)
Albumin: 3.6 g/dL (ref 3.5–5.0)
Alkaline Phosphatase: 50 U/L (ref 38–126)
Anion gap: 8 (ref 5–15)
BUN: 18 mg/dL (ref 8–23)
CO2: 25 mmol/L (ref 22–32)
Calcium: 9.2 mg/dL (ref 8.9–10.3)
Chloride: 107 mmol/L (ref 98–111)
Creatinine, Ser: 1.08 mg/dL (ref 0.61–1.24)
GFR, Estimated: >60 mL/min (ref >60)
Glucose, Bld: 88 mg/dL (ref 70–99)
Potassium: 4.3 mmol/L (ref 3.5–5.1)
Sodium: 140 mmol/L (ref 135–145)
Total Bilirubin: 0.8 mg/dL (ref 0.3–1.2)
Total Protein: 6.7 g/dL (ref 6.5–8.1)

## 2021-11-26 LAB — GLUCOSE, CAPILLARY
Glucose-Capillary: 77 mg/dL (ref 70–99)
Glucose-Capillary: 87 mg/dL (ref 70–99)

## 2021-11-26 LAB — D-DIMER, QUANTITATIVE: D-Dimer, Quant: 7.42 ug/mL-FEU — ABNORMAL HIGH (ref 0.00–0.50)

## 2021-11-26 LAB — ECHOCARDIOGRAM COMPLETE
Area-P 1/2: 2.56 cm2
Height: 70 in
S' Lateral: 3.1 cm
Weight: 2955.93 oz

## 2021-11-26 LAB — HEPARIN LEVEL (UNFRACTIONATED): Heparin Unfractionated: >1.1 IU/mL — ABNORMAL HIGH (ref 0.30–0.70)

## 2021-11-26 LAB — PSA: Prostatic Specific Antigen: 0.03 ng/mL (ref 0.00–4.00)

## 2021-11-26 LAB — BRAIN NATRIURETIC PEPTIDE: B Natriuretic Peptide: 161.6 pg/mL — ABNORMAL HIGH (ref 0.0–100.0)

## 2021-11-26 LAB — LACTIC ACID, PLASMA: Lactic Acid, Venous: 1.8 mmol/L (ref 0.5–1.9)

## 2021-11-26 MED ORDER — ENOXAPARIN SODIUM 100 MG/ML IJ SOSY
1.0000 mg/kg | PREFILLED_SYRINGE | Freq: Two times a day (BID) | INTRAMUSCULAR | Status: AC
  Administered 2021-11-26 – 2021-11-27 (×3): 85 mg via SUBCUTANEOUS
  Filled 2021-11-26: qty 0.85
  Filled 2021-11-26 (×2): qty 1

## 2021-11-26 MED ORDER — LEVOTHYROXINE SODIUM 100 MCG PO TABS
100.0000 ug | ORAL_TABLET | Freq: Every day | ORAL | Status: DC
  Administered 2021-11-27 – 2021-11-28 (×2): 100 ug via ORAL
  Filled 2021-11-26 (×2): qty 1

## 2021-11-26 MED ORDER — GABAPENTIN 100 MG PO CAPS
100.0000 mg | ORAL_CAPSULE | Freq: Three times a day (TID) | ORAL | Status: DC
  Administered 2021-11-26 – 2021-11-28 (×7): 100 mg via ORAL
  Filled 2021-11-26 (×7): qty 1

## 2021-11-26 MED ORDER — HEPARIN (PORCINE) 25000 UT/250ML-% IV SOLN: 1200.0000 [IU]/h | INTRAVENOUS | Status: AC

## 2021-11-26 MED ORDER — TAMSULOSIN HCL 0.4 MG PO CAPS
0.8000 mg | ORAL_CAPSULE | Freq: Every day | ORAL | Status: DC
  Administered 2021-11-26 – 2021-11-28 (×3): 0.8 mg via ORAL
  Filled 2021-11-26 (×3): qty 2

## 2021-11-26 MED ORDER — FINASTERIDE 5 MG PO TABS
5.0000 mg | ORAL_TABLET | Freq: Every day | ORAL | Status: DC
  Administered 2021-11-26 – 2021-11-28 (×3): 5 mg via ORAL
  Filled 2021-11-26 (×3): qty 1

## 2021-11-26 NOTE — Progress Notes (Signed)
°  Echocardiogram 2D Echocardiogram has been performed.  Dan Freeman 11/26/2021, 9:09 AM

## 2021-11-26 NOTE — TOC Initial Note (Signed)
Transition of Care [facility]) - Initial/Assessment Note    Patient Details  Name: Dan Freeman MRN: [id number] Date of Birth: 04-20-1931  [facility] CM/SW Contact:    Verdell Carmine, RN Phone Number: 11/26/2021, 4:54 PM  Clinical Narrative:                 The Transition of Care Department [facility]) has reviewed patient and no TOC needs have been identified at this time. We will continue to monitor patient advancement through interdisciplinary progression rounds. If new patient transition needs arise, please place a TOC consult         Patient Goals and CMS Choice        Expected Discharge Plan and Services                                                Prior Living Arrangements/Services                       Activities of Daily Living Home Assistive Devices/Equipment: Eyeglasses, Other (Comment) (nebulizer) ADL Screening (condition at time of admission) Patient's cognitive ability adequate to safely complete daily activities?: Yes Is the patient deaf or have difficulty hearing?: Yes Does the patient have difficulty seeing, even when wearing glasses/contacts?: Yes Does the patient have difficulty concentrating, remembering, or making decisions?: No Patient able to express need for assistance with ADLs?: Yes Does the patient have difficulty dressing or bathing?: No Independently performs ADLs?: Yes (appropriate for developmental age) Does the patient have difficulty walking or climbing stairs?: No Weakness of Legs: None Weakness of Arms/Hands: None  Permission Sought/Granted                  Emotional Assessment              Admission diagnosis:  Pulmonary embolism (HCC) [I26.99] Exertional dyspnea [R06.09] Scrotal pain [N50.82] Acute saddle pulmonary embolism with acute cor pulmonale (HCC) [I26.02] Patient Active Problem List   Diagnosis Date Noted   Pulmonary embolism ([place]) 11/25/2021   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   First degree AV block 08/12/2019   High blood pressure 06/28/2018   Hypothyroidism 06/28/2018   Chronic obstructive pulmonary disease ([place]) 06/28/2018   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

## 2021-11-26 NOTE — Progress Notes (Signed)
Patient stable for transfer out of unit. [facility], Dr. Avon Gully, will be taking over care tomorrow at [facility].

## 2021-11-26 NOTE — Progress Notes (Addendum)
° °  NAME:  Dan Freeman, MRN:  [id number], DOB:  [birth year], LOS: 1 ADMISSION DATE:  11/25/2021, CONSULTATION DATE:  2/10 REFERRING MD:  Dr. Kathrynn Humble, CHIEF COMPLAINT:  Saddle PE   History of Present Illness:  Patient is a 88 year old M with pertinent PMH of COPD/asthma, HOCM, HTN presents to [facility] on 2/10 with SOB. At admit, CT showed saddle PE with RV/LV >1.0, mild increase in troponin and BNP. Echo pending, vitals have remained stable.   Pertinent  Medical History  Arthritis History of prostate cancer COPD Esophageal diverticulum HTN HOCM Holosystolic murmur  Significant Hospital Events: Including procedures, antibiotic start and stop dates in addition to other pertinent events   2/10 Admitted from ED to ICU, treating with heparin  Interim History / Subjective:  Patient assessed at bedside this AM. He states that he is tired this AM from not sleeping well over the last few nights. He also states that he feels hungry from not being able to eat yesterday. His breathing feels about the same as yesterday.  Objective   Blood pressure (!) 143/72, pulse (!) 58, temperature 97.7 F (36.5 C), temperature source Oral, resp. rate 16, height 5\' 10"  (1.778 m), weight 83.8 kg, SpO2 96 %.        Intake/Output Summary (Last 24 hours) at 11/26/2021 0659 Last data filed at 11/26/2021 9935 Gross per 24 hour  Intake 298.61 ml  Output 850 ml  Net -551.39 ml   Filed Weights   11/25/21 2127 11/26/21 0500  Weight: 82.9 kg 83.8 kg    Examination: General: well-developed, well-nourished, in no acute distress HENT: NCAT, mucous membranes moist Lungs: CTAB, normal work of breathing on room air Cardiovascular: RRR, holosystolic murmur, no r/g. Abdomen: soft, nontender, BS in all 4 quadrants Extremities: No edema/ swelling noted in lower extremities, no erythema or skin changes Neuro: Alert and oriented x3  Hgb 12-12.3 Heparin >1.1 PSA wnl Lactate 1.76-1.8 BNP 161-173 Troponin 45-41 CT head  negative  Resolved Hospital Problem list     Assessment & Plan:  Saddle PE with R heart strain RV/ LV ratio 1-1.2; BNP 173- class 4 submassive, BOVA score 2 intermediate risk. CT chest showed acute PE with right hear strain. Lactate and BNP trending down. Echo showed EF 65-70%, moderate concentric left ventricular hypertrophy, normal right ventriclar systolic function is normal. No signs of right heart strain. Patient has remained hemodynamically stable overnight, not requiring supplemental oxygen. -Change heparin to enoxapain -Will switch to DOAC once make sure patient will be able to afford  History of COPD/ asthma No formal PFT on chart review. Uses Albuterol, Trelegy and Spiriva at home. -incruse ellipta -Breo ellipta -prn duonebs  Hx of HTN, HOCM -continue ASA -pending echo  Hx of remote prostate cancer history of radioactive seed implant. -PSA 0.03  Hx of testicular pain Jan 2023 Plan to check [place] testes.  Hx of chronic normocytic anemia Baseline Hgb around 11-12. Hgb stable at 12.3.   History of hypothyroidism TSH wnl -restart home dose synthroid  Best Practice (right click and "Reselect all SmartList Selections" daily)   Diet/type: NPO DVT prophylaxis: systemic heparin GI prophylaxis: N/A Lines: N/A Foley:  N/A Code Status:  full code Last date of multidisciplinary goals of care discussion [2/11]  Fumiko Cham M. Ronnald Shedden, D.O.  Internal Medicine Resident, PGY-1 [facility]  Pager: [phone] 6:59 AM, 11/26/2021

## 2021-11-26 NOTE — Progress Notes (Signed)
ANTICOAGULATION CONSULT NOTE   Pharmacy Consult for Heparin Indication: pulmonary embolus  No Known Allergies  Patient Measurements: Height: 5\' 10"  (177.8 cm) Weight: 82.9 kg (182 lb 12.2 oz) IBW/kg (Calculated) : 73 Heparin Dosing Weight: 86.9 kg  Vital Signs: Temp: 97.4 F (36.3 C) (02/10 2338) Temp Source: Oral (02/10 2338) BP: 169/79 (02/11 0312) Pulse Rate: 69 (02/11 0312)  Labs: Recent Labs    11/24/21 0936 11/25/21 1428 11/25/21 1735 11/25/21 1935 11/26/21 0330  HGB 12.1* 12.0*  --   --  12.3*  HCT 37.4* 37.5*  --   --  39.0  PLT 186.0 178  --   --  190  LABPROT  --   --   --  13.2  --   INR  --   --   --  1.0  --   HEPARINUNFRC  --   --   --   --  >1.10*  CREATININE  --  1.22  --   --   --   TROPONINIHS  --   --  45* 41*  --      Estimated Creatinine Clearance: 43.2 mL/min (by C-G formula based on SCr of 1.22 mg/dL).   Medical History: Past Medical History:  Diagnosis Date   Arthritis    Cancer ([place])     prostrate     radiation   COPD (chronic obstructive pulmonary disease) ([place])    Esophageal diverticulum    s/p esophageal resection   Essential hypertension    HOCM (hypertrophic obstructive cardiomyopathy) (HCC)    Not congenital, likely 2/2 uncontrolled HBP   Holosystolic murmur    7/67/34 echo shows LVOT stenosis consistent w/ HOCM and likely 2/2 uncontrolled HBP     Medications:  Medications Prior to Admission  Medication Sig Dispense Refill Last Dose   albuterol (PROAIR HFA) 108 (90 Base) MCG/ACT inhaler Inhale 2 puffs into the lungs every 6 (six) hours as needed for wheezing or shortness of breath. 1 each 11    albuterol (VENTOLIN HFA) 108 (90 Base) MCG/ACT inhaler Inhale 2 puffs into the lungs every 6 (six) hours as needed for shortness of breath. 1 each 11    aspirin EC 81 MG tablet Take 81 mg by mouth daily.      b complex vitamins tablet Take 1 tablet by mouth daily.      etodolac (LODINE) 400 MG tablet Take 400 mg by mouth daily.       Fluticasone-Umeclidin-Vilant (TRELEGY ELLIPTA) 200-62.5-25 MCG/ACT AEPB Inhale 1 puff into the lungs daily. 60 each 3    GINKGO BILOBA EXTRACT PO Take 1 capsule by mouth daily.    More than a month   levothyroxine (SYNTHROID, LEVOTHROID) 50 MCG tablet Take 50 mcg by mouth daily before breakfast.      metoprolol succinate (TOPROL-XL) 25 MG 24 hr tablet Take 1 tablet (25 mg total) by mouth daily. Take with or immediately following a meal. 90 tablet 3    Misc Natural Products (GINSENG COMPLEX PO) Take 1 capsule by mouth daily.    More than a month   tiotropium (SPIRIVA HANDIHALER) 18 MCG inhalation capsule Place 1 capsule (18 mcg total) into inhaler and inhale daily. 30 capsule 2    vitamin B-12 (CYANOCOBALAMIN) 50 MCG tablet Take 50 mcg by mouth daily.        Assessment: 32 yom presenting from recommendation of PCP to rule out blood clot secondary to concerns for SOB. Heparin per pharmacy consult placed for pulmonary embolus.  CTA PE with acute PE with evidence of RHS consistent with submassive PE.  Patient is not on anticoagulation prior to arrival.  Hgb 12; plt 178  2/11 AM update:  Heparin level elevated Drawn from arm opposite heparin infusion per RN  Goal of Therapy:  Heparin level 0.3-0.7 units/ml Monitor platelets by anticoagulation protocol: Yes   Plan:  Hold heparin x 1 hr Re-start heparin drip at 1200 units/hr 1300 heparin level  Narda Bonds, PharmD, BCPS Clinical Pharmacist Phone: [phone]

## 2021-11-26 NOTE — Progress Notes (Signed)
VASCULAR LAB    Bilateral lower extremity venous duplex has been performed.  See CV proc for preliminary results.   [place] Zou, RVT 11/26/2021, 9:28 AM

## 2021-11-26 NOTE — Progress Notes (Signed)
ANTICOAGULATION CONSULT NOTE   Pharmacy Consult for Heparin to Enoxaparin Indication: pulmonary embolus  No Known Allergies  Patient Measurements: Height: 5\' 10"  (177.8 cm) Weight: 83.8 kg (184 lb 11.9 oz) IBW/kg (Calculated) : 73 Heparin Dosing Weight: 86.9 kg  Vital Signs: Temp: 98.8 F (37.1 C) (02/11 0800) Temp Source: Oral (02/11 0800) BP: 136/70 (02/11 1000) Pulse Rate: 54 (02/11 1000)  Labs: Recent Labs    11/24/21 0936 11/25/21 1428 11/25/21 1735 11/25/21 1935 11/26/21 0330  HGB 12.1* 12.0*  --   --  12.3*  HCT 37.4* 37.5*  --   --  39.0  PLT 186.0 178  --   --  190  LABPROT  --   --   --  13.2  --   INR  --   --   --  1.0  --   HEPARINUNFRC  --   --   --   --  >1.10*  CREATININE  --  1.22  --   --  1.08  TROPONINIHS  --   --  45* 41*  --      Estimated Creatinine Clearance: 48.8 mL/min (by C-G formula based on SCr of 1.08 mg/dL).   Medical History: Past Medical History:  Diagnosis Date   Arthritis    Cancer ([place])     prostrate     radiation   COPD (chronic obstructive pulmonary disease) ([place])    Esophageal diverticulum    s/p esophageal resection   Essential hypertension    HOCM (hypertrophic obstructive cardiomyopathy) ([facility])    Not congenital, likely 2/2 uncontrolled HBP   Holosystolic murmur    7/62/83 echo shows LVOT stenosis consistent w/ HOCM and likely 2/2 uncontrolled HBP     Medications:  Medications Prior to Admission  Medication Sig Dispense Refill Last Dose   albuterol (PROAIR HFA) 108 (90 Base) MCG/ACT inhaler Inhale 2 puffs into the lungs every 6 (six) hours as needed for wheezing or shortness of breath. 1 each 11 Past Month   albuterol (VENTOLIN HFA) 108 (90 Base) MCG/ACT inhaler Inhale 2 puffs into the lungs every 6 (six) hours as needed for shortness of breath. 1 each 11 Past Month   aspirin EC 81 MG tablet Take 81 mg by mouth daily.   11/25/2021   b complex vitamins tablet Take 1 tablet by mouth daily.   Past Month    benzonatate (TESSALON) 200 MG capsule Take 200 mg by mouth 3 (three) times daily as needed for cough.   Past Month   DM-Doxylamine-Acetaminophen (NYQUIL COLD & FLU PO) Take 1 Dose by mouth 2 (two) times daily as needed (cough, cold).   Past Month   etodolac (LODINE) 400 MG tablet Take 400 mg by mouth daily.   11/25/2021   finasteride (PROSCAR) 5 MG tablet Take 5 mg by mouth daily.   11/25/2021   Fluticasone-Umeclidin-Vilant (TRELEGY ELLIPTA) 200-62.5-25 MCG/ACT AEPB Inhale 1 puff into the lungs daily. 60 each 3 11/25/2021   gabapentin (NEURONTIN) 100 MG capsule Take 100 mg by mouth 3 (three) times daily.   11/25/2021   Levothyroxine Sodium 100 MCG CAPS Take 100 mcg by mouth daily before breakfast.   11/25/2021   Misc Natural Products (GINSENG COMPLEX PO) Take 1 capsule by mouth daily.    Past Month   tamsulosin (FLOMAX) 0.4 MG CAPS capsule Take 0.8 mg by mouth daily.   11/25/2021   tiotropium (SPIRIVA HANDIHALER) 18 MCG inhalation capsule Place 1 capsule (18 mcg total) into inhaler and inhale daily.  30 capsule 2 11/25/2021   metoprolol succinate (TOPROL-XL) 25 MG 24 hr tablet Take 1 tablet (25 mg total) by mouth daily. Take with or immediately following a meal. (Patient not taking: Reported on 11/26/2021) 90 tablet 3 Not Taking     Assessment: 45 yom presenting from recommendation of PCP to rule out blood clot secondary to concerns for SOB. Heparin per pharmacy consult placed for pulmonary embolus. CTA PE with acute PE with evidence of RHS consistent with submassive PE. No RHS noted on ECHO. Doppler results pending.   Patient is not on anticoagulation prior to arrival. Pharmacy consulted to transition to enoxaparin.   Goal of Therapy:  Anti-Xa level 0.6-1 units/ml 4hrs after LMWH dose given Monitor platelets by anticoagulation protocol: Yes   Plan:  Stop heparin and start enoxaparin 85mg  SQ q12h  Administer first dose of enoxaparin when heparin stopped Follow up cost of apixaban and rivaroxaban for  transition to oral anticoagulant - consulted placed to [facility] team   Cristela Felt, PharmD, BCPS Clinical Pharmacist 11/26/2021 10:17 AM

## 2021-11-27 LAB — CBC
HCT: 34.4 % — ABNORMAL LOW (ref 39.0–52.0)
Hemoglobin: 11.3 g/dL — ABNORMAL LOW (ref 13.0–17.0)
MCH: 28 pg (ref 26.0–34.0)
MCHC: 32.8 g/dL (ref 30.0–36.0)
MCV: 85.4 fL (ref 80.0–100.0)
Platelets: 171 10*3/uL (ref 150–400)
RBC: 4.03 MIL/uL — ABNORMAL LOW (ref 4.22–5.81)
RDW: 15 % (ref 11.5–15.5)
WBC: 4.4 10*3/uL (ref 4.0–10.5)
nRBC: 0 % (ref 0.0–0.2)

## 2021-11-27 MED ORDER — PANTOPRAZOLE SODIUM 40 MG PO TBEC
40.0000 mg | DELAYED_RELEASE_TABLET | Freq: Every day | ORAL | Status: DC
  Administered 2021-11-27 – 2021-11-28 (×2): 40 mg via ORAL
  Filled 2021-11-27 (×2): qty 1

## 2021-11-27 MED ORDER — APIXABAN 5 MG PO TABS
10.0000 mg | ORAL_TABLET | Freq: Two times a day (BID) | ORAL | Status: DC
  Administered 2021-11-28: 10 mg via ORAL
  Filled 2021-11-27: qty 2

## 2021-11-27 MED ORDER — B COMPLEX PO TABS: 1.0000 | ORAL_TABLET | Freq: Every day | ORAL | Status: DC

## 2021-11-27 MED ORDER — B COMPLEX-C PO TABS
1.0000 | ORAL_TABLET | Freq: Every day | ORAL | Status: DC
  Administered 2021-11-27 – 2021-11-28 (×2): 1 via ORAL
  Filled 2021-11-27 (×2): qty 1

## 2021-11-27 MED ORDER — APIXABAN 5 MG PO TABS: 5.0000 mg | ORAL_TABLET | Freq: Two times a day (BID) | ORAL | Status: DC

## 2021-11-27 MED ORDER — METOPROLOL SUCCINATE ER 25 MG PO TB24
25.0000 mg | ORAL_TABLET | Freq: Every day | ORAL | Status: DC
  Administered 2021-11-27 – 2021-11-28 (×2): 25 mg via ORAL
  Filled 2021-11-27 (×2): qty 1

## 2021-11-27 NOTE — Care Management (Signed)
Eliquis card sent by CM  for patient.

## 2021-11-27 NOTE — Evaluation (Signed)
Occupational Therapy Evaluation Patient Details Name: Dan Freeman MRN: [id number] DOB: Nov 25, 1930 Today's Date: 11/27/2021   History of Present Illness Pt is a 88 y.o. M who presents 11/25/2021 with SOB. CT showed saddle PE. Significant PMH: COPD/asthma, HOCM, HTN.   Clinical Impression   PTA patient independent and driving. Admitted for above and presenting at baseline independent level for ADLs and mobility.  Pt educated on energy conservation techniques and safety.  Based on performance today, no further OT needs have been identified and OT will sign off. Pt agreeable with recommendations.      Recommendations for follow up therapy are one component of a multi-disciplinary discharge planning process, led by the attending physician.  Recommendations may be updated based on patient status, additional functional criteria and insurance authorization.   Follow Up Recommendations  No OT follow up    Assistance Recommended at Discharge PRN  Patient can return home with the following      Functional Status Assessment     Equipment Recommendations  Tub/shower seat    Recommendations for Other Services       Precautions / Restrictions Precautions Precautions: None Restrictions Weight Bearing Restrictions: No      Mobility Bed Mobility               General bed mobility comments: OOB in recliner upon entry    Transfers Overall transfer level: Independent Equipment used: None                      Balance Overall balance assessment: Mild deficits observed, not formally tested                                         ADL either performed or assessed with clinical judgement   ADL Overall ADL's : Independent                                             Vision Baseline Vision/History: 1 Wears glasses Vision Assessment?: No apparent visual deficits     Perception     Praxis      Pertinent Vitals/Pain Pain  Assessment Pain Assessment: No/denies pain     Hand Dominance     Extremity/Trunk Assessment Upper Extremity Assessment Upper Extremity Assessment: Overall WFL for tasks assessed   Lower Extremity Assessment Lower Extremity Assessment: Defer to PT evaluation   Cervical / Trunk Assessment Cervical / Trunk Assessment: Normal   Communication Communication Communication: No difficulties   Cognition Arousal/Alertness: Awake/alert Behavior During Therapy: WFL for tasks assessed/performed Overall Cognitive Status: Within Functional Limits for tasks assessed                                       General Comments  reviewed energy conservation techniques and safety during ADLs    Exercises     Shoulder Instructions      Home Living Family/patient expects to be discharged to:: Private residence Living Arrangements: Spouse/significant other Available Help at Discharge: Family Type of Home: House (townhouse) Home Access: Stairs to enter Technical brewer of Steps: "a couple from garage"   Home Layout: One level     Bathroom Shower/Tub: Tub/shower unit  Bathroom Toilet: Standard     Home Equipment: Cane - single point          Prior Functioning/Environment Prior Level of Function : Independent/Modified Independent;Driving                        OT Problem List: Decreased activity tolerance;Decreased knowledge of use of DME or AE      OT Treatment/Interventions:      OT Goals(Current goals can be found in the care plan section) Acute Rehab OT Goals Patient Stated Goal: home OT Goal Formulation: With patient  OT Frequency:      Co-evaluation              AM-PAC OT "6 Clicks" Daily Activity     Outcome Measure Help from another person eating meals?: None Help from another person taking care of personal grooming?: None Help from another person toileting, which includes using toliet, bedpan, or urinal?: None Help from another  person bathing (including washing, rinsing, drying)?: None Help from another person to put on and taking off regular upper body clothing?: None Help from another person to put on and taking off regular lower body clothing?: None 6 Click Score: 24   End of Session Nurse Communication: Mobility status  Activity Tolerance: Patient tolerated treatment well Patient left: in chair;with call bell/phone within reach  OT Visit Diagnosis: Other abnormalities of gait and mobility (R26.89)                Time: 1114-1130 OT Time Calculation (min): 16 min Charges:  OT General Charges $OT Visit: 1 Visit OT Evaluation $OT Eval Low Complexity: 1 Low  Jolaine Artist, OT Acute Rehabilitation Services Pager [phone] Office [phone]   Delight Stare 11/27/2021, 1:29 PM

## 2021-11-27 NOTE — Evaluation (Signed)
Physical Therapy Evaluation Patient Details Name: Dan Freeman MRN: [id number] DOB: May 08, 1931 Today's Date: 11/27/2021  History of Present Illness  Pt is a 88 y.o. M who presents 11/25/2021 with SOB. CT showed saddle PE. Significant PMH: COPD/asthma, HOCM, HTN.  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 Overall, pt is mobilizing fairly well. Ambulating room distances with no assistive device without physical assist. Pt performed ADL's at sink I.e. brushing teeth and washing face. SpO2 92% on RA, HR 70-104 bpm. Deferred further ambulation distance as pt wishing to eat his breakfast. Will likely progress well.     Recommendations for follow up therapy are one component of a multi-disciplinary discharge planning process, led by the attending physician.  Recommendations may be updated based on patient status, additional functional criteria and insurance authorization.  Follow Up Recommendations No PT follow up    Assistance Recommended at Discharge PRN  Patient can return home with the following       Equipment Recommendations None recommended by PT  Recommendations for Other Services       Functional Status Assessment Patient has had a recent decline in their functional status and demonstrates the ability to make significant improvements in function in a reasonable and predictable amount of time.     Precautions / Restrictions Precautions Precautions: None Restrictions Weight Bearing Restrictions: No      Mobility  Bed Mobility Overal bed mobility: Modified Independent             General bed mobility comments: HOB elevated ~12 degrees    Transfers Overall transfer level: Independent Equipment used: None                    Ambulation/Gait Ambulation/Gait assistance: Modified independent (Device/Increase time) Gait Distance (Feet): 30 Feet Assistive device: None Gait Pattern/deviations: Step-through pattern,  Decreased stride length       General Gait Details: No gross imbalance noted  Stairs            Wheelchair Mobility    Modified Rankin (Stroke Patients Only)       Balance Overall balance assessment: Mild deficits observed, not formally tested                                           Pertinent Vitals/Pain Pain Assessment Pain Assessment: No/denies pain    Home Living Family/patient expects to be discharged to:: Private residence Living Arrangements: Spouse/significant other Available Help at Discharge: Family Type of Home: House (townhouse) Home Access: Stairs to enter   Technical brewer of Steps:  ("a couple from garage")   Home Layout: One level Home Equipment: Radio producer - single point      Prior Function Prior Level of Function : Independent/Modified Independent;Driving                     Hand Dominance        Extremity/Trunk Assessment   Upper Extremity Assessment Upper Extremity Assessment: Defer to OT evaluation    Lower Extremity Assessment Lower Extremity Assessment: RLE deficits/detail;LLE deficits/detail RLE Deficits / Details: Strength 5/5 LLE Deficits / Details: Strength 5/5    Cervical / Trunk Assessment Cervical / Trunk Assessment: Normal  Communication   Communication: No difficulties  Cognition Arousal/Alertness: Awake/alert Behavior During Therapy: WFL for tasks assessed/performed Overall Cognitive Status: Within Functional Limits for  tasks assessed                                          General Comments      Exercises     Assessment/Plan    PT Assessment Patient needs continued PT services  PT Problem List Decreased activity tolerance;Decreased mobility;Cardiopulmonary status limiting activity       PT Treatment Interventions Gait training;Stair training;Functional mobility training;Therapeutic activities;Therapeutic exercise;Balance training;Patient/family education     PT Goals (Current goals can be found in the Care Plan section)  Acute Rehab PT Goals Patient Stated Goal: go home PT Goal Formulation: With patient Time For Goal Achievement: 12/11/21 Potential to Achieve Goals: Good    Frequency Min 3X/week     Co-evaluation               AM-PAC PT "6 Clicks" Mobility  Outcome Measure Help needed turning from your back to your side while in a flat bed without using bedrails?: None Help needed moving from lying on your back to sitting on the side of a flat bed without using bedrails?: None Help needed moving to and from a bed to a chair (including a wheelchair)?: None Help needed standing up from a chair using your arms (e.g., wheelchair or bedside chair)?: None Help needed to walk in hospital room?: None Help needed climbing 3-5 steps with a railing? : A Little 6 Click Score: 23    End of Session   Activity Tolerance: Patient tolerated treatment well Patient left: in chair;with call bell/phone within reach Nurse Communication: Mobility status PT Visit Diagnosis: Difficulty in walking, not elsewhere classified (R26.2)    Time: 3299-2426 PT Time Calculation (min) (ACUTE ONLY): 27 min   Charges:   PT Evaluation $PT Eval Low Complexity: 1 Low PT Treatments $Therapeutic Activity: 8-22 mins        Dan Freeman, PT, DPT Acute Rehabilitation Services Pager [phone] Office [phone]   Dan Freeman 11/27/2021, 9:29 AM

## 2021-11-27 NOTE — Progress Notes (Signed)
ANTICOAGULATION CONSULT NOTE   Pharmacy Consult for Enoxaparin > Eliquis Indication: pulmonary embolus  No Known Allergies  Patient Measurements: Height: 5\' 10"  (177.8 cm) Weight: 84.6 kg (186 lb 8.2 oz) IBW/kg (Calculated) : 73 Heparin Dosing Weight: 86.9 kg  Vital Signs: Temp: 97.5 F (36.4 C) (02/12 0933) Temp Source: Oral (02/12 0933) BP: 110/66 (02/12 0933) Pulse Rate: 101 (02/12 0933)  Labs: Recent Labs    11/25/21 1428 11/25/21 1735 11/25/21 1935 11/26/21 0330 11/27/21 0124  HGB 12.0*  --   --  12.3* 11.3*  HCT 37.5*  --   --  39.0 34.4*  PLT 178  --   --  190 171  LABPROT  --   --  13.2  --   --   INR  --   --  1.0  --   --   HEPARINUNFRC  --   --   --  >1.10*  --   CREATININE 1.22  --   --  1.08  --   TROPONINIHS  --  45* 41*  --   --      Estimated Creatinine Clearance: 48.8 mL/min (by C-G formula based on SCr of 1.08 mg/dL).   Medical History: Past Medical History:  Diagnosis Date   Arthritis    Cancer ([place])     prostrate     radiation   COPD (chronic obstructive pulmonary disease) ([place])    Esophageal diverticulum    s/p esophageal resection   Essential hypertension    HOCM (hypertrophic obstructive cardiomyopathy) ([facility])    Not congenital, likely 2/2 uncontrolled HBP   Holosystolic murmur    8/34/19 echo shows LVOT stenosis consistent w/ HOCM and likely 2/2 uncontrolled HBP     Medications:  Medications Prior to Admission  Medication Sig Dispense Refill Last Dose   albuterol (PROAIR HFA) 108 (90 Base) MCG/ACT inhaler Inhale 2 puffs into the lungs every 6 (six) hours as needed for wheezing or shortness of breath. 1 each 11 Past Month   albuterol (VENTOLIN HFA) 108 (90 Base) MCG/ACT inhaler Inhale 2 puffs into the lungs every 6 (six) hours as needed for shortness of breath. 1 each 11 Past Month   aspirin EC 81 MG tablet Take 81 mg by mouth daily.   11/25/2021   b complex vitamins tablet Take 1 tablet by mouth daily.   Past Month   benzonatate  (TESSALON) 200 MG capsule Take 200 mg by mouth 3 (three) times daily as needed for cough.   Past Month   DM-Doxylamine-Acetaminophen (NYQUIL COLD & FLU PO) Take 1 Dose by mouth 2 (two) times daily as needed (cough, cold).   Past Month   etodolac (LODINE) 400 MG tablet Take 400 mg by mouth daily.   11/25/2021   finasteride (PROSCAR) 5 MG tablet Take 5 mg by mouth daily.   11/25/2021   Fluticasone-Umeclidin-Vilant (TRELEGY ELLIPTA) 200-62.5-25 MCG/ACT AEPB Inhale 1 puff into the lungs daily. 60 each 3 11/25/2021   gabapentin (NEURONTIN) 100 MG capsule Take 100 mg by mouth 3 (three) times daily.   11/25/2021   Levothyroxine Sodium 100 MCG CAPS Take 100 mcg by mouth daily before breakfast.   11/25/2021   Misc Natural Products (GINSENG COMPLEX PO) Take 1 capsule by mouth daily.    Past Month   tamsulosin (FLOMAX) 0.4 MG CAPS capsule Take 0.8 mg by mouth daily.   11/25/2021   tiotropium (SPIRIVA HANDIHALER) 18 MCG inhalation capsule Place 1 capsule (18 mcg total) into inhaler and inhale daily.  30 capsule 2 11/25/2021   metoprolol succinate (TOPROL-XL) 25 MG 24 hr tablet Take 1 tablet (25 mg total) by mouth daily. Take with or immediately following a meal. (Patient not taking: Reported on 11/26/2021) 90 tablet 3 Not Taking     Assessment: 22 yom presenting from recommendation of PCP to rule out blood clot secondary to concerns for SOB. Heparin per pharmacy consult placed for pulmonary embolus. CTA PE with acute PE with evidence of RHS consistent with submassive PE. No RHS noted on ECHO. Doppler results pending.   Patient is not on anticoagulation prior to arrival. Pharmacy consulted to transition to eliquis. Per provider, continue Enoxaparin for today and start Eliquis 2/13. Pt adequately treated for 2 days, so only 5 days of treatment dose eliquis is indicated.   Goal of Therapy:  Monitor platelets by anticoagulation protocol: Yes   Plan:  Give enoxaparin 85mg  SQ tonight  Administer first dose of apixaban  10 mg PO tomorrow (2/13) morning. Give apixaban 10 mg PO BID x 5 days, transition to apixaban 5 mg 2/18 morning. Follow up cost of apixaban and rivaroxaban for transition to oral anticoagulant - consulted placed to [facility] team   [place]. [place], Student Pharmacist  11/27/2021 12:43 PM

## 2021-11-27 NOTE — Progress Notes (Signed)
PROGRESS NOTE                                                                                                                                                                                                             Patient Demographics:    Dan Freeman, is a 88 y.o. male, DOB - 1931/01/31, LFY:[id number]  Outpatient Primary MD for the patient is Maylon Peppers, MD    LOS - 2  Admit date - 11/25/2021    Chief Complaint  Patient presents with   Shortness of Breath       Brief Narrative (HPI from H&P)  88 year old M with pertinent PMH of COPD/asthma, HOCM, HTN who presented to the ER with chief complaints of shortness of breath his work-up was suggestive of saddle PE with evidence of right heart strain on CT angiogram.  He was admitted to ICU stabilized and then transferred to hospitalist service on 11/27/2021.   Subjective:    Dan Freeman today has, No headache, No chest pain, No abdominal pain - No Nausea, No new weakness tingling or numbness, no SOB.   Assessment  & Plan :    Assessment and Plan: No notes have been filed under this hospital service. Service: Hospitalist       Acute Hypoxic Resp. Failure due to acute saddle PE - he has been appropriately started on full anticoagulation currently on Lovenox which I agree with and will continue for another 24 hours, thankfully on echocardiogram there is not much of a right heart strain as noticed on CT angiogram of the chest, lower extremity venous ultrasound does not show any further blood clot or DVT.  Encouraged to sit in chair and advance activity today, currently on room air, if he remains stable we will transition him to oral Eliquis on 11/28/2021 and possibly discharge home if he is clinically stable  2.  History of COPD and asthma.  Stable continue home inhalers and supportive care with nebulizer treatments as needed.  Currently no wheezing  3.  History of  HOCM, hypertension along with systolic anterior motion of anterior mitral valve leaflet.  Actually improved as compared to previous echocardiogram.  Currently stable outpatient follow-up with his primary cardiologist postdischarge.  Resume home dose beta-blocker.  4.  Past medical history of prostate cancer and some testicular discomfort in ICU.  Discomfort  completely resolved, scrotal ultrasound noted with mild left-sided hydrocele and no acute findings.  Outpatient urology follow-up.   5.  Chronic normocytic anemia.  Outpatient follow-up with PCP.  6.  Hypothyroidism TSH stable on home dose Synthroid.  7.  BPH .  Home alpha-blocker's resumed.       Condition - Fair  Family Communication  :  None present  Code Status :  Full  Consults  :  PCCM  PUD Prophylaxis : PPI   Procedures  :     TTE -  1. Left ventricular ejection fraction, by estimation, is 65 to 70%. The left ventricle has normal function. The left ventricle has no regional wall motion abnormalities. There is moderate concentric left ventricular hypertrophy. Left ventricular diastolic parameters are consistent with Grade I diastolic dysfunction (impaired relaxation).  2. There appears to be mild systolic anterior motion of the anterior mitral valve leaflet with trivial to mild MR. LVOT gradient 36mmHg. This is improved from prior TTE in 2019 where there was severe SAM with a mean gradient of 51mmHg.  3. Right ventricular systolic function is normal. The right ventricular size is normal. Tricuspid regurgitation signal is inadequate for assessing PA pressure.  4. The mitral valve is normal in structure. Mild mitral valve regurgitation. There is mild SAM as detailed above  5. The aortic valve is tricuspid. There is mild thickening of the aortic valve. Aortic valve regurgitation is not visualized. Aortic valve sclerosis/calcification is present, without any evidence of aortic stenosis.  6. The inferior vena cava is normal in size with  greater than 50% respiratory variability, suggesting right atrial pressure of 3 mmHg. Comparison(s): Compared to prior TTE in 2019, the SAM appears mild on current study with amean gradient of 60mmHg compared to 93mmHg on last study. Otherwise, there is no significant change.  CTA -  Positive for acute PE with of right heart strain (RV/LV Ratio = 1.0-1.2) consistent with at least submassive (intermediate risk) PE. The presence of right heart strain has been associated with an increased risk of morbidity and mortality. Aortic Atherosclerosis (ICD10-I70.0).  Leg [place] - No DVT  Scrotal US - small left-sided hydrocele.      Disposition Plan  :    Status is: Inpatient  DVT Prophylaxis  :  Lovenox     Lab Results  Component Value Date   PLT 171 11/27/2021    Diet :  Diet Order             Diet regular Room service appropriate? Yes; Fluid consistency: Thin  Diet effective now                    Inpatient Medications  Scheduled Meds:  aspirin EC  81 mg Oral Daily   enoxaparin (LOVENOX) injection  1 mg/kg Subcutaneous Q12H   famotidine  20 mg Oral BID   finasteride  5 mg Oral Daily   fluticasone furoate-vilanterol  1 puff Inhalation Daily   gabapentin  100 mg Oral TID   levothyroxine  100 mcg Oral Q0600   tamsulosin  0.8 mg Oral Daily   umeclidinium bromide  1 puff Inhalation Daily   Continuous Infusions: PRN Meds:.acetaminophen, docusate sodium, ipratropium-albuterol, polyethylene glycol  Antibiotics  :    Anti-infectives (From admission, onward)    None        Time Spent in minutes  30   Lala Lund M.D on 11/27/2021 at 11:57 AM  To page go to [email or web address]   [facility] -  Office  [phone]  See all Orders from today for further details    Objective:   Vitals:   11/26/21 2310 11/27/21 0304 11/27/21 0352 11/27/21 0933  BP: 128/80 (!) 152/75  110/66  Pulse: 66 67  (!) 101  Resp: 18 18  18   Temp: 97.9 F (36.6 C) 98 F (36.7 C)  (!)  97.5 F (36.4 C)  TempSrc: Oral Oral  Oral  SpO2: 98% 98%    Weight:   84.6 kg   Height:        Wt Readings from Last 3 Encounters:  11/27/21 84.6 kg  11/24/21 86.9 kg  09/23/21 83.6 kg    No intake or output data in the 24 hours ending 11/27/21 1157   Physical Exam  Awake Alert, No new F.N deficits, Normal affect [place].AT,PERRAL Supple Neck, No JVD,   Symmetrical Chest wall movement, Good air movement bilaterally, CTAB RRR,No Gallops,Rubs or new Murmurs,  +ve B.Sounds, Abd Soft, No tenderness,   No Cyanosis, Clubbing or edema       Data Review:    CBC Recent Labs  Lab 11/24/21 0936 11/25/21 1428 11/26/21 0330 11/27/21 0124  WBC 4.1 4.7 4.9 4.4  HGB 12.1* 12.0* 12.3* 11.3*  HCT 37.4* 37.5* 39.0 34.4*  PLT 186.0 178 190 171  MCV 84.6 88.0 86.7 85.4  MCH  --  28.2 27.3 28.0  MCHC 32.2 32.0 31.5 32.8  RDW 15.9* 15.4 15.4 15.0  LYMPHSABS 1.6 2.0  --   --   MONOABS 0.5 0.4  --   --   EOSABS 0.2 0.1  --   --   BASOSABS 0.0 0.0  --   --     Electrolytes Recent Labs  Lab 11/25/21 1428 11/25/21 1935 11/25/21 2211 11/26/21 0330  NA 141  --   --  140  K 4.1  --   --  4.3  CL 109  --   --  107  CO2 24  --   --  25  GLUCOSE 96  --   --  88  BUN 23  --   --  18  CREATININE 1.22  --   --  1.08  CALCIUM 9.0  --   --  9.2  AST  --   --   --  15  ALT  --   --   --  12  ALKPHOS  --   --   --  50  BILITOT  --   --   --  0.8  ALBUMIN  --   --   --  3.6  MG  --   --  2.3  --   DDIMER  --   --  5.87* 7.42*  LATICACIDVEN  --   --   --  1.8  INR  --  1.0  --   --   TSH  --   --  0.960  --   BNP 173.2*  --   --  161.6*    ------------------------------------------------------------------------------------------------------------------ No results for input(s): CHOL, HDL, LDLCALC, TRIG, CHOLHDL, LDLDIRECT in the last 72 hours.  No results found for: HGBA1C  Recent Labs    11/25/21 2211  TSH 0.960    ------------------------------------------------------------------------------------------------------------------ ID Labs Recent Labs  Lab 11/24/21 0936 11/25/21 1428 11/25/21 2211 11/26/21 0330 11/27/21 0124  WBC 4.1 4.7  --  4.9 4.4  PLT 186.0 178  --  190 171  DDIMER  --   --  5.87* 7.42*  --  LATICACIDVEN  --   --   --  1.8  --   CREATININE  --  1.22  --  1.08  --    Cardiac Enzymes No results for input(s): CKMB, TROPONINI, MYOGLOBIN in the last 168 hours.  Invalid input(s): CK   Radiology Reports DG Chest 2 View  Result Date: 11/25/2021 CLINICAL DATA:  Shortness of breath EXAM: CHEST - 2 VIEW COMPARISON:  06/27/2018 FINDINGS: Hyperinflation. Lateral view degraded by patient arm position. Midline trachea. Mild cardiomegaly. Atherosclerosis in the transverse aorta. Pleuroparenchymal scarring at the inferior left hemithorax. Calcified granuloma in the left apex. EKG lead projects over the left upper lobe on the frontal radiograph. IMPRESSION: Hyperinflation, without acute disease. Aortic Atherosclerosis (ICD10-I70.0). Electronically Signed   By: Abigail Miyamoto M.D.   On: 11/25/2021 15:06   CT HEAD WO CONTRAST (5MM)  Result Date: 11/26/2021 CLINICAL DATA:  Syncope. EXAM: CT HEAD WITHOUT CONTRAST TECHNIQUE: Contiguous axial images were obtained from the base of the skull through the vertex without intravenous contrast. RADIATION DOSE REDUCTION: This exam was performed according to the departmental dose-optimization program which includes automated exposure control, adjustment of the mA and/or kV according to patient size and/or use of iterative reconstruction technique. COMPARISON:  Head CT dated 05/22/2020. FINDINGS: Brain: Mild age-related atrophy and chronic microvascular ischemic changes. There is no acute intracranial hemorrhage. No mass effect or midline shift no extra-axial fluid collection. Vascular: No hyperdense vessel or unexpected calcification. Skull: Normal. Negative for  fracture or focal lesion. Sinuses/Orbits: Mild mucoperiosteal thickening of paranasal sinuses. No air-fluid level. The mastoid air cells are clear. Other: None IMPRESSION: 1. No acute intracranial pathology. 2. Mild age-related atrophy and chronic microvascular ischemic changes. Electronically Signed   By: Anner Crete M.D.   On: 11/26/2021 02:59   CT Angio Chest PE W/Cm &/Or Wo Cm  Result Date: 11/25/2021 CLINICAL DATA:  Concern for pulmonary embolism. EXAM: CT ANGIOGRAPHY CHEST WITH CONTRAST TECHNIQUE: Multidetector CT imaging of the chest was performed using the standard protocol during bolus administration of intravenous contrast. Multiplanar CT image reconstructions and MIPs were obtained to evaluate the vascular anatomy. RADIATION DOSE REDUCTION: This exam was performed according to the departmental dose-optimization program which includes automated exposure control, adjustment of the mA and/or kV according to patient size and/or use of iterative reconstruction technique. CONTRAST:  81mL OMNIPAQUE IOHEXOL 350 MG/ML SOLN COMPARISON:  Chest CT dated 05/20/2018. FINDINGS: Cardiovascular: There is no cardiomegaly or pericardial effusion. There is mild atherosclerotic calcification of the thoracic aorta. No aneurysmal dilatation. There is a saddle embolus straddling the bifurcation of the pulmonary trunk with standing into the central, and lobar pulmonary artery branches bilaterally. The risks evidence of right heart straining with RV/LV ratio in the range of 1.0-1.25. Mediastinum/Nodes: No hilar or mediastinal adenopathy. The esophagus is grossly unremarkable no mediastinal fluid collection. Lungs/Pleura: A 1 cm calcified left upper lobe granuloma. Bibasilar linear atelectasis/scarring. No focal consolidation, pleural effusion, or pneumothorax. The central airways are patent. Upper Abdomen: No acute abnormality. Musculoskeletal: Old healed left posterior rib fractures. No acute osseous pathology. Prior  left rib resection. Review of the MIP images confirms the above findings. IMPRESSION: Positive for acute PE with of right heart strain (RV/LV Ratio = 1.0-1.2) consistent with at least submassive (intermediate risk) PE. The presence of right heart strain has been associated with an increased risk of morbidity and mortality. Aortic Atherosclerosis (ICD10-I70.0). These results were called by telephone at the time of interpretation on 11/25/2021 at 7:20 pm to provider  ANKIT NANAVATI , who verbally acknowledged these results. Electronically Signed   By: Anner Crete M.D.   On: 11/25/2021 19:27   ECHOCARDIOGRAM COMPLETE  Result Date: 11/26/2021    ECHOCARDIOGRAM REPORT   Patient Name:   Dan Freeman Date of Exam: 11/26/2021 Medical Rec #:  [id number]     Height:       70.0 in Accession #:    [id number]    Weight:       184.7 lb Date of Birth:  1931/03/02     BSA:          2.018 m Patient Age:    24 years      BP:           118/78 mmHg Patient Gender: M             HR:           62 bpm. Exam Location:  Inpatient Procedure: 2D Echo STAT ECHO Indications:    pulmonary embolus  History:        Patient has prior history of Echocardiogram examinations, most                 recent 06/28/2018. Hypertrophic Cardiomyopathy, COPD;                 Arrythmias:RBBB and first degree av block.  Sonographer:    Johny Chess RDCS Referring Phys: [place]  1. Left ventricular ejection fraction, by estimation, is 65 to 70%. The left ventricle has normal function. The left ventricle has no regional wall motion abnormalities. There is moderate concentric left ventricular hypertrophy. Left ventricular diastolic parameters are consistent with Grade I diastolic dysfunction (impaired relaxation).  2. There appears to be mild systolic anterior motion of the anterior mitral valve leaflet with trivial to mild MR. LVOT gradient 19mmHg. This is improved from prior TTE in 2019 where there was severe SAM with a mean  gradient of 33mmHg.  3. Right ventricular systolic function is normal. The right ventricular size is normal. Tricuspid regurgitation signal is inadequate for assessing PA pressure.  4. The mitral valve is normal in structure. Mild mitral valve regurgitation. There is mild SAM as detailed above  5. The aortic valve is tricuspid. There is mild thickening of the aortic valve. Aortic valve regurgitation is not visualized. Aortic valve sclerosis/calcification is present, without any evidence of aortic stenosis.  6. The inferior vena cava is normal in size with greater than 50% respiratory variability, suggesting right atrial pressure of 3 mmHg. Comparison(s): Compared to prior TTE in 2019, the SAM appears mild on current study with amean gradient of 60mmHg compared to 67mmHg on last study. Otherwise, there is no significant change. FINDINGS  Left Ventricle: Left ventricular ejection fraction, by estimation, is 65 to 70%. The left ventricle has normal function. The left ventricle has no regional wall motion abnormalities. The left ventricular internal cavity size was normal in size. There is  moderate concentric left ventricular hypertrophy. Left ventricular diastolic parameters are consistent with Grade I diastolic dysfunction (impaired relaxation). Right Ventricle: The right ventricular size is normal. Right vetricular wall thickness was not well visualized. Right ventricular systolic function is normal. Tricuspid regurgitation signal is inadequate for assessing PA pressure. Left Atrium: Left atrial size was normal in size. Right Atrium: Right atrial size was normal in size. Pericardium: There is no evidence of pericardial effusion. Mitral Valve: Mild SAM. The mitral valve is normal in structure. Mild mitral valve regurgitation. Tricuspid  Valve: The tricuspid valve is normal in structure. Tricuspid valve regurgitation is trivial. Aortic Valve: The aortic valve is tricuspid. There is mild thickening of the aortic valve.  Aortic valve regurgitation is not visualized. Aortic valve sclerosis/calcification is present, without any evidence of aortic stenosis. Pulmonic Valve: The pulmonic valve was normal in structure. Pulmonic valve regurgitation is trivial. Aorta: The aortic root and ascending aorta are structurally normal, with no evidence of dilitation. Venous: The inferior vena cava is normal in size with greater than 50% respiratory variability, suggesting right atrial pressure of 3 mmHg. IAS/Shunts: No atrial level shunt detected by color flow Doppler.  LEFT VENTRICLE PLAX 2D LVIDd:         4.70 cm   Diastology LVIDs:         3.10 cm   LV e' medial:    4.13 cm/s LV PW:         1.40 cm   LV E/e' medial:  12.3 LV IVS:        1.10 cm   LV e' lateral:   6.42 cm/s LVOT diam:     1.90 cm   LV E/e' lateral: 7.9 LV SV:         100 LV SV Index:   49 LVOT Area:     2.84 cm  RIGHT VENTRICLE             IVC RV S prime:     11.00 cm/s  IVC diam: 2.20 cm TAPSE (M-mode): 1.9 cm LEFT ATRIUM             Index        RIGHT ATRIUM           Index LA diam:        3.60 cm 1.78 cm/m   RA Area:     13.80 cm LA Vol (A2C):   34.2 ml 16.95 ml/m  RA Volume:   30.70 ml  15.21 ml/m LA Vol (A4C):   46.2 ml 22.89 ml/m LA Biplane Vol: 41.5 ml 20.56 ml/m  AORTIC VALVE LVOT Vmax:   143.00 cm/s LVOT Vmean:  94.500 cm/s LVOT VTI:    0.351 m  AORTA Ao Root diam: 3.10 cm Ao Asc diam:  3.00 cm MITRAL VALVE MV Area (PHT): 2.56 cm    SHUNTS MV Decel Time: 296 msec    Systemic VTI:  0.35 m MV E velocity: 51.00 cm/s  Systemic Diam: 1.90 cm MV A velocity: 93.40 cm/s MV E/A ratio:  0.55 Gwyndolyn Kaufman MD Electronically signed by Gwyndolyn Kaufman MD Signature Date/Time: 11/26/2021/9:44:43 AM    Final    US SCROTUM W/DOPPLER  Result Date: 11/25/2021 CLINICAL DATA:  Left scrotal swelling EXAM: SCROTAL ULTRASOUND DOPPLER ULTRASOUND OF THE TESTICLES TECHNIQUE: Complete ultrasound examination of the testicles, epididymis, and other scrotal structures was performed.  Color and spectral Doppler ultrasound were also utilized to evaluate blood flow to the testicles. COMPARISON:  None. FINDINGS: Right testicle Measurements: 3.1 x 1.7 x 2.4 cm.  No mass.  Limited microlithiasis. Left testicle Measurements: 3.0 x 1.9 x 2.1 cm. No mass. Limited microlithiasis. Right epididymis:  Normal in size and appearance. Left epididymis:  Normal in size and appearance. Hydrocele:  Moderate left hydrocele. Varicocele:  None visualized. Pulsed Doppler interrogation of both testes demonstrates normal low resistance arterial and venous waveforms bilaterally. IMPRESSION: Moderate left hydrocele. Otherwise negative scrotal ultrasound. No evidence of testicular torsion. Electronically Signed   By: Julian Hy M.D.   On: 11/25/2021 22:20  VAS [place] LOWER EXTREMITY VENOUS (DVT)  Result Date: 11/27/2021  Lower Venous DVT Study Patient Name:  Dan Freeman  Date of Exam:   11/26/2021 Medical Rec #: [id number]      Accession #:    [id number] Date of Birth: 1931-01-02      Patient Gender: M Patient Age:   72 years Exam Location:  [facility] Procedure:      VAS [place] LOWER EXTREMITY VENOUS (DVT) Referring Phys: Andres Labrum --------------------------------------------------------------------------------  Indications: Pulmonary embolism.  Limitations: Diminutive femoral veins. Comparison Study: No prior study on file Performing Technologist: Sharion Dove RVS  Examination Guidelines: A complete evaluation includes B-mode imaging, spectral Doppler, color Doppler, and power Doppler as needed of all accessible portions of each vessel. Bilateral testing is considered an integral part of a complete examination. Limited examinations for reoccurring indications may be performed as noted. The reflux portion of the exam is performed with the patient in reverse Trendelenburg.  +---------+---------------+---------+-----------+----------+--------------+  RIGHT      Compressibility Phasicity Spontaneity Properties Thrombus Aging  +---------+---------------+---------+-----------+----------+--------------+  CFV       Full            Yes       Yes                                    +---------+---------------+---------+-----------+----------+--------------+  SFJ       Full                                                             +---------+---------------+---------+-----------+----------+--------------+  FV Prox   Full                                                             +---------+---------------+---------+-----------+----------+--------------+  FV Mid    Full            Yes       Yes                                    +---------+---------------+---------+-----------+----------+--------------+  FV Distal Full            Yes       Yes                                    +---------+---------------+---------+-----------+----------+--------------+  PFV       Full                                                             +---------+---------------+---------+-----------+----------+--------------+  POP       Full            Yes  Yes                                    +---------+---------------+---------+-----------+----------+--------------+  PTV       Full                                                             +---------+---------------+---------+-----------+----------+--------------+  PERO      Full                                                             +---------+---------------+---------+-----------+----------+--------------+   +---------+---------------+---------+-----------+----------+--------------+  LEFT      Compressibility Phasicity Spontaneity Properties Thrombus Aging  +---------+---------------+---------+-----------+----------+--------------+  CFV       Full            Yes       Yes                                    +---------+---------------+---------+-----------+----------+--------------+  SFJ       Full                                                              +---------+---------------+---------+-----------+----------+--------------+  FV Prox   Full                                                             +---------+---------------+---------+-----------+----------+--------------+  FV Mid    Full            Yes       Yes                                    +---------+---------------+---------+-----------+----------+--------------+  FV Distal Full            Yes       Yes                                    +---------+---------------+---------+-----------+----------+--------------+  PFV       Full                                                             +---------+---------------+---------+-----------+----------+--------------+  POP       Full  Yes       Yes                                    +---------+---------------+---------+-----------+----------+--------------+  PTV       Full                                                             +---------+---------------+---------+-----------+----------+--------------+  PERO      Full                                                             +---------+---------------+---------+-----------+----------+--------------+     Summary: BILATERAL: - No evidence of deep vein thrombosis seen in the lower extremities, bilaterally. -No evidence of popliteal cyst, bilaterally.   *See table(s) above for measurements and observations. Electronically signed by Jamelle Haring on 11/27/2021 at 10:39:47 AM.    Final

## 2021-11-28 ENCOUNTER — Other Ambulatory Visit (HOSPITAL_COMMUNITY): Payer: Self-pay

## 2021-11-28 LAB — COMPREHENSIVE METABOLIC PANEL
ALT: 13 U/L (ref 0–44)
AST: 18 U/L (ref 15–41)
Albumin: 3.2 g/dL — ABNORMAL LOW (ref 3.5–5.0)
Alkaline Phosphatase: 65 U/L (ref 38–126)
Anion gap: 8 (ref 5–15)
BUN: 24 mg/dL — ABNORMAL HIGH (ref 8–23)
CO2: 22 mmol/L (ref 22–32)
Calcium: 8.8 mg/dL — ABNORMAL LOW (ref 8.9–10.3)
Chloride: 106 mmol/L (ref 98–111)
Creatinine, Ser: 1.23 mg/dL (ref 0.61–1.24)
GFR, Estimated: 56 mL/min — ABNORMAL LOW (ref >60)
Glucose, Bld: 117 mg/dL — ABNORMAL HIGH (ref 70–99)
Potassium: 4.4 mmol/L (ref 3.5–5.1)
Sodium: 136 mmol/L (ref 135–145)
Total Bilirubin: 0.2 mg/dL — ABNORMAL LOW (ref 0.3–1.2)
Total Protein: 6.1 g/dL — ABNORMAL LOW (ref 6.5–8.1)

## 2021-11-28 LAB — CBC WITH DIFFERENTIAL/PLATELET
Abs Immature Granulocytes: 0.01 10*3/uL (ref 0.00–0.07)
Basophils Absolute: 0 10*3/uL (ref 0.0–0.1)
Basophils Relative: 0 %
Eosinophils Absolute: 0.2 10*3/uL (ref 0.0–0.5)
Eosinophils Relative: 4 %
HCT: 33.7 % — ABNORMAL LOW (ref 39.0–52.0)
Hemoglobin: 11.2 g/dL — ABNORMAL LOW (ref 13.0–17.0)
Immature Granulocytes: 0 %
Lymphocytes Relative: 37 %
Lymphs Abs: 1.9 10*3/uL (ref 0.7–4.0)
MCH: 28.1 pg (ref 26.0–34.0)
MCHC: 33.2 g/dL (ref 30.0–36.0)
MCV: 84.7 fL (ref 80.0–100.0)
Monocytes Absolute: 0.5 10*3/uL (ref 0.1–1.0)
Monocytes Relative: 11 %
Neutro Abs: 2.3 10*3/uL (ref 1.7–7.7)
Neutrophils Relative %: 48 %
Platelets: 180 10*3/uL (ref 150–400)
RBC: 3.98 MIL/uL — ABNORMAL LOW (ref 4.22–5.81)
RDW: 15 % (ref 11.5–15.5)
WBC: 4.9 10*3/uL (ref 4.0–10.5)
nRBC: 0 % (ref 0.0–0.2)

## 2021-11-28 LAB — MAGNESIUM: Magnesium: 2 mg/dL (ref 1.7–2.4)

## 2021-11-28 LAB — BRAIN NATRIURETIC PEPTIDE: B Natriuretic Peptide: 109.1 pg/mL — ABNORMAL HIGH (ref 0.0–100.0)

## 2021-11-28 MED ORDER — APIXABAN 5 MG PO TABS
10.0000 mg | ORAL_TABLET | Freq: Two times a day (BID) | ORAL | 0 refills | Status: DC
  Filled 2021-11-28: qty 70, 30d supply, fill #0

## 2021-11-28 MED ORDER — APIXABAN 5 MG PO TABS
5.0000 mg | ORAL_TABLET | Freq: Two times a day (BID) | ORAL | 0 refills | Status: DC
  Filled 2021-11-28: qty 60, 30d supply, fill #0

## 2021-11-28 MED ORDER — APIXABAN 5 MG PO TABS
10.0000 mg | ORAL_TABLET | Freq: Two times a day (BID) | ORAL | 0 refills | Status: DC
  Filled 2021-11-28: qty 68, 30d supply, fill #0

## 2021-11-28 MED ORDER — PANTOPRAZOLE SODIUM 40 MG PO TBEC
40.0000 mg | DELAYED_RELEASE_TABLET | Freq: Every day | ORAL | 0 refills | Status: AC
  Filled 2021-11-28: qty 30, 30d supply, fill #0

## 2021-11-28 NOTE — TOC Benefit Eligibility Note (Signed)
Patient Advocate Encounter ° °Insurance verification completed.   ° °The patient is currently admitted and upon discharge could be taking Eliquis 5 mg. ° °The current 30 day co-pay is, $47.00.  ° °The patient is insured through [employer] Medicare Part D  ° ° ° °Martice Doty, CPhT °Pharmacy Patient Advocate Specialist °[facility] Patient Advocate Team °Direct Number: [phone]  Fax: [phone] ° ° ° ° ° °  °

## 2021-11-28 NOTE — Discharge Instructions (Addendum)
Follow with Primary MD Dan Peppers, MD in 7 days, also follow with your Urologist, Lung and Heart doctor in 2 weeks.   Get CBC, CMP, 2 view Chest X ray -  checked next visit within 1 week by Primary MD    Activity: As tolerated with Full fall precautions use walker/cane & assistance as needed  Disposition Home   Diet: Heart Healthy    Special Instructions: If you have smoked or chewed Tobacco  in the last 2 yrs please stop smoking, stop any regular Alcohol  and or any Recreational drug use.  On your next visit with your primary care physician please Get Medicines reviewed and adjusted.  Please request your Prim.MD to go over all Hospital Tests and Procedure/Radiological results at the follow up, please get all Hospital records sent to your Prim MD by signing hospital release before you go home.  If you experience worsening of your admission symptoms, develop shortness of breath, life threatening emergency, suicidal or homicidal thoughts you must seek medical attention immediately by calling 911 or calling your MD immediately  if symptoms less severe.  You Must read complete instructions/literature along with all the possible adverse reactions/side effects for all the Medicines you take and that have been prescribed to you. Take any new Medicines after you have completely understood and accpet all the possible adverse reactions/side effects.    Information on my medicine - ELIQUIS (apixaban)  This medication education was reviewed with me or my healthcare representative as part of my discharge preparation.  The pharmacist that spoke with me during my hospital stay was:  Arman Bogus, [facility]  Why was Eliquis prescribed for you? Eliquis was prescribed to treat blood clots that may have been found in the veins of your legs (deep vein thrombosis) or in your lungs (pulmonary embolism) and to reduce the risk of them occurring again.  What do You need to know about Eliquis ? The  starting dose is 10 mg (two 5 mg tablets) taken TWICE daily for the FIRST SEVEN (7) DAYS, then on 12-05-2021  the dose is reduced to ONE 5 mg tablet taken TWICE daily.  Eliquis may be taken with or without food.   Try to take the dose about the same time in the morning and in the evening. If you have difficulty swallowing the tablet whole please discuss with your pharmacist how to take the medication safely.  Take Eliquis exactly as prescribed and DO NOT stop taking Eliquis without talking to the doctor who prescribed the medication.  Stopping may increase your risk of developing a new blood clot.  Refill your prescription before you run out.  After discharge, you should have regular check-up appointments with your healthcare provider that is prescribing your Eliquis.    What do you do if you miss a dose? If a dose of ELIQUIS is not taken at the scheduled time, take it as soon as possible on the same day and twice-daily administration should be resumed. The dose should not be doubled to make up for a missed dose.  Important Safety Information A possible side effect of Eliquis is bleeding. You should call your healthcare provider right away if you experience any of the following: Bleeding from an injury or your nose that does not stop. Unusual colored urine (red or dark brown) or unusual colored stools (red or black). Unusual bruising for unknown reasons. A serious fall or if you hit your head (even if there is no bleeding).  Some medicines may interact with Eliquis and might increase your risk of bleeding or clotting while on Eliquis. To help avoid this, consult your healthcare provider or pharmacist prior to using any new prescription or non-prescription medications, including herbals, vitamins, non-steroidal anti-inflammatory drugs (NSAIDs) and supplements.  This website has more information on Eliquis (apixaban): http://www.eliquis.com/eliquis/home

## 2021-11-28 NOTE — TOC Transition Note (Signed)
Transition of Care [facility]) - CM/SW Discharge Note   Patient Details  Name: Dan Freeman MRN: [id number] Date of Birth: [birth year]  [facility] CM/SW Contact:  Pollie Friar, RN Phone Number: 11/28/2021, 9:07 AM   Clinical Narrative:    Patient is discharging home today. Medications sent to [place] and will be delivered to the room. [facility] will apply Eliquis 30 day free card to his medications.  Pt has transportation home.    Final next level of care: Home/Self Care Barriers to Discharge: No Barriers Identified   Patient Goals and CMS Choice        Discharge Placement                       Discharge Plan and Services                                     Social Determinants of Health (SDOH) Interventions     Readmission Risk Interventions No flowsheet data found.

## 2021-11-28 NOTE — Care Management Important Message (Signed)
Important Message  Patient Details  Name: Dan Freeman MRN: [id number] Date of Birth: 09/19/1931   Medicare Important Message Given:  Yes   Patient has a contact Precaution order in place will mail the IM to the patient home address.  Graycen Sadlon 11/28/2021, 3:27 PM

## 2021-11-28 NOTE — Progress Notes (Signed)
ANTICOAGULATION CONSULT NOTE   Pharmacy Consult for Enoxaparin > Eliquis Indication: pulmonary embolus  No Known Allergies  Patient Measurements: Height: 5\' 10"  (177.8 cm) Weight: 85.9 kg (189 lb 6 oz) IBW/kg (Calculated) : 73 Heparin Dosing Weight: 86.9 kg  Vital Signs: Temp: 97.6 F (36.4 C) (02/13 0805) Temp Source: Oral (02/13 0805) BP: 122/56 (02/13 0805) Pulse Rate: 62 (02/13 0805)  Labs: Recent Labs    11/25/21 1428 11/25/21 1735 11/25/21 1935 11/26/21 0330 11/27/21 0124 11/28/21 0052  HGB 12.0*  --   --  12.3* 11.3* 11.2*  HCT 37.5*  --   --  39.0 34.4* 33.7*  PLT 178  --   --  190 171 180  LABPROT  --   --  13.2  --   --   --   INR  --   --  1.0  --   --   --   HEPARINUNFRC  --   --   --  >1.10*  --   --   CREATININE 1.22  --   --  1.08  --  1.23  TROPONINIHS  --  45* 41*  --   --   --      Estimated Creatinine Clearance: 42.9 mL/min (by C-G formula based on SCr of 1.23 mg/dL).   Medical History: Past Medical History:  Diagnosis Date   Arthritis    Cancer ([place])     prostrate     radiation   COPD (chronic obstructive pulmonary disease) ([place])    Esophageal diverticulum    s/p esophageal resection   Essential hypertension    HOCM (hypertrophic obstructive cardiomyopathy) ([facility])    Not congenital, likely 2/2 uncontrolled HBP   Holosystolic murmur    1/60/10 echo shows LVOT stenosis consistent w/ HOCM and likely 2/2 uncontrolled HBP     Medications:  Medications Prior to Admission  Medication Sig Dispense Refill Last Dose   albuterol (PROAIR HFA) 108 (90 Base) MCG/ACT inhaler Inhale 2 puffs into the lungs every 6 (six) hours as needed for wheezing or shortness of breath. 1 each 11 Past Month   albuterol (VENTOLIN HFA) 108 (90 Base) MCG/ACT inhaler Inhale 2 puffs into the lungs every 6 (six) hours as needed for shortness of breath. 1 each 11 Past Month   aspirin EC 81 MG tablet Take 81 mg by mouth daily.   11/25/2021   b complex vitamins tablet Take 1  tablet by mouth daily.   Past Month   benzonatate (TESSALON) 200 MG capsule Take 200 mg by mouth 3 (three) times daily as needed for cough.   Past Month   DM-Doxylamine-Acetaminophen (NYQUIL COLD & FLU PO) Take 1 Dose by mouth 2 (two) times daily as needed (cough, cold).   Past Month   etodolac (LODINE) 400 MG tablet Take 400 mg by mouth daily.   11/25/2021   finasteride (PROSCAR) 5 MG tablet Take 5 mg by mouth daily.   11/25/2021   Fluticasone-Umeclidin-Vilant (TRELEGY ELLIPTA) 200-62.5-25 MCG/ACT AEPB Inhale 1 puff into the lungs daily. 60 each 3 11/25/2021   gabapentin (NEURONTIN) 100 MG capsule Take 100 mg by mouth 3 (three) times daily.   11/25/2021   Levothyroxine Sodium 100 MCG CAPS Take 100 mcg by mouth daily before breakfast.   11/25/2021   Misc Natural Products (GINSENG COMPLEX PO) Take 1 capsule by mouth daily.    Past Month   tamsulosin (FLOMAX) 0.4 MG CAPS capsule Take 0.8 mg by mouth daily.   11/25/2021  tiotropium (SPIRIVA HANDIHALER) 18 MCG inhalation capsule Place 1 capsule (18 mcg total) into inhaler and inhale daily. 30 capsule 2 11/25/2021   metoprolol succinate (TOPROL-XL) 25 MG 24 hr tablet Take 1 tablet (25 mg total) by mouth daily. Take with or immediately following a meal. (Patient not taking: Reported on 11/26/2021) 90 tablet 3 Not Taking     Assessment: 36 yom presenting from recommendation of PCP to rule out blood clot secondary to concerns for SOB. Heparin per pharmacy consult placed for pulmonary embolus. CTA PE with acute PE with evidence of RHS consistent with submassive PE. No RHS noted on ECHO. Doppler results pending.   Patient was not on anticoagulation prior to arrival. Pharmacy consulted to transition to eliquis. Per provider, continue Enoxaparin for today and start Eliquis 2/13.    Goal of Therapy:  Monitor platelets by anticoagulation protocol: Yes   Plan:  Transitioned from Enoxaparin full dose to Apixaban.  First dose of apixaban 10 mg PO given this AM  11/28/21.  Give apixaban 10 mg PO BID x  7 days total for PE then on 12/05/21  reduce dose to apixaban 5 mg BID. Copay is $47/30 days Will educate patient about apixban this morning.   Nicole Cella, RPh Clinical Pharmacist [phone] 11/28/2021 9:44 AM

## 2021-11-28 NOTE — Progress Notes (Signed)
Chaplain visited with patient who was being discharged today. Patient indicated that he was interested in the Advance Directive and indicated that his wife would serve as the [place]. Chaplain left two (2) copies with patient for he and his wife.  Patient was educated that document could be completed and notarized outside of the hospital.   Chaplain also made sure to inform that a copy of the document should be presented if patient returned to hospital.  11/28/21 1100  Clinical Encounter Type  Visited With Patient  Visit Type Initial  Referral From Nurse  Consult/Referral To Chaplain

## 2021-11-28 NOTE — Discharge Summary (Addendum)
Dan Freeman EPP:[id number] DOB: 1931/02/21 DOA: 11/25/2021  PCP: Maylon Peppers, MD  Admit date: 11/25/2021  Discharge date: 11/28/2021  Admitted From: Home   Disposition:  Home   Recommendations for Outpatient Follow-up:   Follow up with PCP in 1-2 weeks  PCP Please obtain BMP/CBC, 2 view CXR in 1week,  (see Discharge instructions)   PCP Please follow up on the following pending results:    Home Health: None Equipment/Devices: None  Consultations: PCCM Discharge Condition: Stable    CODE STATUS: Full    Diet Recommendation: Heart Healthy   Diet Order             Diet - low sodium heart healthy           Diet regular Room service appropriate? Yes; Fluid consistency: Thin  Diet effective now                    Chief Complaint  Patient presents with   Shortness of Breath     Brief history of present illness from the day of admission and additional interim summary    88 year old M with pertinent PMH of COPD/asthma, HOCM, HTN who presented to the ER with chief complaints of shortness of breath his work-up was suggestive of saddle PE with evidence of right heart strain on CT angiogram.  He was admitted to ICU stabilized and then transferred to hospitalist service on 11/27/2021.                                                                   Hospital Course   Acute Hypoxic Resp. Failure due to acute saddle PE - he has been appropriately started on full anticoagulation currently on Lovenox which I agree with and will continue for another 24 hours, thankfully on echocardiogram there is not much of a right heart strain as noticed on CT angiogram of the chest, lower extremity venous ultrasound does not show any further blood clot or DVT.    Currently on room air, now absolutely symptom-free, will  transition him to Eliquis and discharged home with PCP follow-up.  He will require a minimum of 9 months of anticoagulation if not lifelong.  Defer final course to PCP based on risk versus benefit.  He is also going to follow-up with his pulmonologist soon.    2.  History of COPD and asthma.  Stable continue home inhalers and supportive care with nebulizer treatments as needed.  Currently no wheezing.   3.  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  Actually improved as compared to previous echocardiogram.  Currently stable outpatient follow-up with his primary cardiologist postdischarge.  Resume home dose beta-blocker.  Have discontinued his aspirin as he is currently on Eliquis.  Requested to follow-up with his cardiologist in 1 to [redacted] weeks along with his pulmonologist.   4.  Past medical history of prostate cancer and some testicular discomfort in ICU.  Discomfort completely resolved, scrotal ultrasound noted with mild left-sided hydrocele and no acute findings.  Outpatient urology follow-up.  Of note wife informs me that he was recently seen by urologist for a similar issue, will have him follow-up with urologist within a 7 to 10-day timeframe postdischarge.   5.  Chronic normocytic anemia.  Outpatient follow-up with PCP.   6.  Hypothyroidism TSH stable on home dose Synthroid.   7.  BPH .  Home alpha-blocker's resumed.   Discharge diagnosis     Principal Problem:   Pulmonary embolism [facility])    Discharge instructions    Discharge Instructions     Diet - low sodium heart healthy   Complete by: As directed    Discharge instructions   Complete by: As directed    Follow with Primary MD Maylon Peppers, MD in 7 days, also follow with your Urologist, Lung and Heart doctor in 2 weeks  Get CBC, CMP, 2 view Chest X ray -  checked next visit within 1 week by Primary MD    Activity: As tolerated with Full fall precautions use  walker/cane & assistance as needed  Disposition Home   Diet: Heart Healthy    Special Instructions: If you have smoked or chewed Tobacco  in the last 2 yrs please stop smoking, stop any regular Alcohol  and or any Recreational drug use.  On your next visit with your primary care physician please Get Medicines reviewed and adjusted.  Please request your Prim.MD to go over all Hospital Tests and Procedure/Radiological results at the follow up, please get all Hospital records sent to your Prim MD by signing hospital release before you go home.  If you experience worsening of your admission symptoms, develop shortness of breath, life threatening emergency, suicidal or homicidal thoughts you must seek medical attention immediately by calling 911 or calling your MD immediately  if symptoms less severe.  You Must read complete instructions/literature along with all the possible adverse reactions/side effects for all the Medicines you take and that have been prescribed to you. Take any new Medicines after you have completely understood and accpet all the possible adverse reactions/side effects.   Increase activity slowly   Complete by: As directed        Discharge Medications   Allergies as of 11/28/2021   No Known Allergies      Medication List     STOP taking these medications    aspirin EC 81 MG tablet       TAKE these medications    albuterol 108 (90 Base) MCG/ACT inhaler Commonly known as: VENTOLIN HFA Inhale 2 puffs into the lungs every 6 (six) hours as needed for shortness of breath. What changed: Another medication with the same name was added. Make sure you understand how and when to take each.   albuterol 108 (90 Base) MCG/ACT inhaler Commonly known as: ProAir HFA Inhale 2 puffs into the lungs every 6 (six) hours as needed for wheezing or shortness of breath. What changed: You were already taking a medication with the same name, and this prescription was added. Make  sure you understand how and when to take each.   apixaban 5 MG Tabs tablet Commonly known as: ELIQUIS Take 2 tablets (10 mg total) by mouth 2 (two) times daily for 4  days, then take 1 tablet (5 mg total) by mouth twice daily.   apixaban 5 MG Tabs tablet Commonly known as: ELIQUIS Take 1 tablet (5 mg total) by mouth 2 (two) times daily. Start taking on: December 05, 2021   b complex vitamins tablet Take 1 tablet by mouth daily.   benzonatate 200 MG capsule Commonly known as: TESSALON Take 200 mg by mouth 3 (three) times daily as needed for cough.   etodolac 400 MG tablet Commonly known as: LODINE Take 400 mg by mouth daily.   finasteride 5 MG tablet Commonly known as: PROSCAR Take 5 mg by mouth daily.   gabapentin 100 MG capsule Commonly known as: NEURONTIN Take 100 mg by mouth 3 (three) times daily.   GINSENG COMPLEX PO Take 1 capsule by mouth daily.   Levothyroxine Sodium 100 MCG Caps Take 100 mcg by mouth daily before breakfast.   metoprolol succinate 25 MG 24 hr tablet Commonly known as: TOPROL-XL Take 1 tablet (25 mg total) by mouth daily. Take with or immediately following a meal.   NYQUIL COLD & FLU PO Take 1 Dose by mouth 2 (two) times daily as needed (cough, cold).   pantoprazole 40 MG tablet Commonly known as: PROTONIX Take 1 tablet (40 mg total) by mouth daily. Start taking on: November 29, 2021   tamsulosin 0.4 MG Caps capsule Commonly known as: FLOMAX Take 0.8 mg by mouth daily.   tiotropium 18 MCG inhalation capsule Commonly known as: Spiriva HandiHaler Place 1 capsule (18 mcg total) into inhaler and inhale daily.   Trelegy Ellipta 200-62.5-25 MCG/ACT Aepb Generic drug: Fluticasone-Umeclidin-Vilant Inhale 1 puff into the lungs daily.         Follow-up Information     Maylon Peppers, MD. Schedule an appointment as soon as possible for a visit in 1 week(s).   Specialty: Family Medicine Contact information: [address] [place] [address] [phone]         Buford Dresser, MD .   Specialty: Cardiology Contact information: [address] [place] [address] [phone]                 Major procedures and Radiology Reports - PLEASE review detailed and final reports thoroughly  -       DG Chest 2 View  Result Date: 11/25/2021 CLINICAL DATA:  Shortness of breath EXAM: CHEST - 2 VIEW COMPARISON:  06/27/2018 FINDINGS: Hyperinflation. Lateral view degraded by patient arm position. Midline trachea. Mild cardiomegaly. Atherosclerosis in the transverse aorta. Pleuroparenchymal scarring at the inferior left hemithorax. Calcified granuloma in the left apex. EKG lead projects over the left upper lobe on the frontal radiograph. IMPRESSION: Hyperinflation, without acute disease. Aortic Atherosclerosis (ICD10-I70.0). Electronically Signed   By: Abigail Miyamoto M.D.   On: 11/25/2021 15:06   CT HEAD WO CONTRAST (5MM)  Result Date: 11/26/2021 CLINICAL DATA:  Syncope. EXAM: CT HEAD WITHOUT CONTRAST TECHNIQUE: Contiguous axial images were obtained from the base of the skull through the vertex without intravenous contrast. RADIATION DOSE REDUCTION: This exam was performed according to the departmental dose-optimization program which includes automated exposure control, adjustment of the mA and/or kV according to patient size and/or use of iterative reconstruction technique. COMPARISON:  Head CT dated 05/22/2020. FINDINGS: Brain: Mild age-related atrophy and chronic microvascular ischemic changes. There is no acute intracranial hemorrhage. No mass effect or midline shift no extra-axial fluid collection. Vascular: No hyperdense vessel or unexpected calcification. Skull: Normal. Negative for fracture  or focal lesion. Sinuses/Orbits: Mild mucoperiosteal thickening of paranasal sinuses. No air-fluid level. The mastoid air cells are clear. Other: None IMPRESSION: 1. No acute intracranial pathology.  2. Mild age-related atrophy and chronic microvascular ischemic changes. Electronically Signed   By: Anner Crete M.D.   On: 11/26/2021 02:59   CT Angio Chest PE W/Cm &/Or Wo Cm  Result Date: 11/25/2021 CLINICAL DATA:  Concern for pulmonary embolism. EXAM: CT ANGIOGRAPHY CHEST WITH CONTRAST TECHNIQUE: Multidetector CT imaging of the chest was performed using the standard protocol during bolus administration of intravenous contrast. Multiplanar CT image reconstructions and MIPs were obtained to evaluate the vascular anatomy. RADIATION DOSE REDUCTION: This exam was performed according to the departmental dose-optimization program which includes automated exposure control, adjustment of the mA and/or kV according to patient size and/or use of iterative reconstruction technique. CONTRAST:  66mL OMNIPAQUE IOHEXOL 350 MG/ML SOLN COMPARISON:  Chest CT dated 05/20/2018. FINDINGS: Cardiovascular: There is no cardiomegaly or pericardial effusion. There is mild atherosclerotic calcification of the thoracic aorta. No aneurysmal dilatation. There is a saddle embolus straddling the bifurcation of the pulmonary trunk with standing into the central, and lobar pulmonary artery branches bilaterally. The risks evidence of right heart straining with RV/LV ratio in the range of 1.0-1.25. Mediastinum/Nodes: No hilar or mediastinal adenopathy. The esophagus is grossly unremarkable no mediastinal fluid collection. Lungs/Pleura: A 1 cm calcified left upper lobe granuloma. Bibasilar linear atelectasis/scarring. No focal consolidation, pleural effusion, or pneumothorax. The central airways are patent. Upper Abdomen: No acute abnormality. Musculoskeletal: Old healed left posterior rib fractures. No acute osseous pathology. Prior left rib resection. Review of the MIP images confirms the above findings. IMPRESSION: Positive for acute PE with of right heart strain (RV/LV Ratio = 1.0-1.2) consistent with at least submassive (intermediate  risk) PE. The presence of right heart strain has been associated with an increased risk of morbidity and mortality. Aortic Atherosclerosis (ICD10-I70.0). These results were called by telephone at the time of interpretation on 11/25/2021 at 7:20 pm to provider [facility] , who verbally acknowledged these results. Electronically Signed   By: Anner Crete M.D.   On: 11/25/2021 19:27   ECHOCARDIOGRAM COMPLETE  Result Date: 11/26/2021    ECHOCARDIOGRAM REPORT   Patient Name:   Dan Freeman Date of Exam: 11/26/2021 Medical Rec #:  [id number]     Height:       70.0 in Accession #:    [id number]    Weight:       184.7 lb Date of Birth:  03/24/1931     BSA:          2.018 m Patient Age:    34 years      BP:           118/78 mmHg Patient Gender: M             HR:           62 bpm. Exam Location:  Inpatient Procedure: 2D Echo STAT ECHO Indications:    pulmonary embolus  History:        Patient has prior history of Echocardiogram examinations, most                 recent 06/28/2018. Hypertrophic Cardiomyopathy, COPD;                 Arrythmias:RBBB and first degree av block.  Sonographer:    Johny Chess RDCS Referring Phys: [place]  1. Left ventricular ejection fraction, by  estimation, is 65 to 70%. The left ventricle has normal function. The left ventricle has no regional wall motion abnormalities. There is moderate concentric left ventricular hypertrophy. Left ventricular diastolic parameters are consistent with Grade I diastolic dysfunction (impaired relaxation).  2. There appears to be mild systolic anterior motion of the anterior mitral valve leaflet with trivial to mild MR. LVOT gradient 46mmHg. This is improved from prior TTE in 2019 where there was severe SAM with a mean gradient of 73mmHg.  3. Right ventricular systolic function is normal. The right ventricular size is normal. Tricuspid regurgitation signal is inadequate for assessing PA pressure.  4. The mitral valve is normal  in structure. Mild mitral valve regurgitation. There is mild SAM as detailed above  5. The aortic valve is tricuspid. There is mild thickening of the aortic valve. Aortic valve regurgitation is not visualized. Aortic valve sclerosis/calcification is present, without any evidence of aortic stenosis.  6. The inferior vena cava is normal in size with greater than 50% respiratory variability, suggesting right atrial pressure of 3 mmHg. Comparison(s): Compared to prior TTE in 2019, the SAM appears mild on current study with amean gradient of 57mmHg compared to 3mmHg on last study. Otherwise, there is no significant change. FINDINGS  Left Ventricle: Left ventricular ejection fraction, by estimation, is 65 to 70%. The left ventricle has normal function. The left ventricle has no regional wall motion abnormalities. The left ventricular internal cavity size was normal in size. There is  moderate concentric left ventricular hypertrophy. Left ventricular diastolic parameters are consistent with Grade I diastolic dysfunction (impaired relaxation). Right Ventricle: The right ventricular size is normal. Right vetricular wall thickness was not well visualized. Right ventricular systolic function is normal. Tricuspid regurgitation signal is inadequate for assessing PA pressure. Left Atrium: Left atrial size was normal in size. Right Atrium: Right atrial size was normal in size. Pericardium: There is no evidence of pericardial effusion. Mitral Valve: Mild SAM. The mitral valve is normal in structure. Mild mitral valve regurgitation. Tricuspid Valve: The tricuspid valve is normal in structure. Tricuspid valve regurgitation is trivial. Aortic Valve: The aortic valve is tricuspid. There is mild thickening of the aortic valve. Aortic valve regurgitation is not visualized. Aortic valve sclerosis/calcification is present, without any evidence of aortic stenosis. Pulmonic Valve: The pulmonic valve was normal in structure. Pulmonic valve  regurgitation is trivial. Aorta: The aortic root and ascending aorta are structurally normal, with no evidence of dilitation. Venous: The inferior vena cava is normal in size with greater than 50% respiratory variability, suggesting right atrial pressure of 3 mmHg. IAS/Shunts: No atrial level shunt detected by color flow Doppler.  LEFT VENTRICLE PLAX 2D LVIDd:         4.70 cm   Diastology LVIDs:         3.10 cm   LV e' medial:    4.13 cm/s LV PW:         1.40 cm   LV E/e' medial:  12.3 LV IVS:        1.10 cm   LV e' lateral:   6.42 cm/s LVOT diam:     1.90 cm   LV E/e' lateral: 7.9 LV SV:         100 LV SV Index:   49 LVOT Area:     2.84 cm  RIGHT VENTRICLE             IVC RV S prime:     11.00 cm/s  IVC diam: 2.20  cm TAPSE (M-mode): 1.9 cm LEFT ATRIUM             Index        RIGHT ATRIUM           Index LA diam:        3.60 cm 1.78 cm/m   RA Area:     13.80 cm LA Vol (A2C):   34.2 ml 16.95 ml/m  RA Volume:   30.70 ml  15.21 ml/m LA Vol (A4C):   46.2 ml 22.89 ml/m LA Biplane Vol: 41.5 ml 20.56 ml/m  AORTIC VALVE LVOT Vmax:   143.00 cm/s LVOT Vmean:  94.500 cm/s LVOT VTI:    0.351 m  AORTA Ao Root diam: 3.10 cm Ao Asc diam:  3.00 cm MITRAL VALVE MV Area (PHT): 2.56 cm    SHUNTS MV Decel Time: 296 msec    Systemic VTI:  0.35 m MV E velocity: 51.00 cm/s  Systemic Diam: 1.90 cm MV A velocity: 93.40 cm/s MV E/A ratio:  0.55 Gwyndolyn Kaufman MD Electronically signed by Gwyndolyn Kaufman MD Signature Date/Time: 11/26/2021/9:44:43 AM    Final    US SCROTUM W/DOPPLER  Result Date: 11/25/2021 CLINICAL DATA:  Left scrotal swelling EXAM: SCROTAL ULTRASOUND DOPPLER ULTRASOUND OF THE TESTICLES TECHNIQUE: Complete ultrasound examination of the testicles, epididymis, and other scrotal structures was performed. Color and spectral Doppler ultrasound were also utilized to evaluate blood flow to the testicles. COMPARISON:  None. FINDINGS: Right testicle Measurements: 3.1 x 1.7 x 2.4 cm.  No mass.  Limited microlithiasis.  Left testicle Measurements: 3.0 x 1.9 x 2.1 cm. No mass. Limited microlithiasis. Right epididymis:  Normal in size and appearance. Left epididymis:  Normal in size and appearance. Hydrocele:  Moderate left hydrocele. Varicocele:  None visualized. Pulsed Doppler interrogation of both testes demonstrates normal low resistance arterial and venous waveforms bilaterally. IMPRESSION: Moderate left hydrocele. Otherwise negative scrotal ultrasound. No evidence of testicular torsion. Electronically Signed   By: Julian Hy M.D.   On: 11/25/2021 22:20   VAS [place] LOWER EXTREMITY VENOUS (DVT)  Result Date: 11/27/2021  Lower Venous DVT Study Patient Name:  Dan Freeman  Date of Exam:   11/26/2021 Medical Rec #: [id number]      Accession #:    [id number] Date of Birth: 03-05-1931      Patient Gender: M Patient Age:   88 years Exam Location:  [facility] Procedure:      VAS [place] LOWER EXTREMITY VENOUS (DVT) Referring Phys: Andres Labrum --------------------------------------------------------------------------------  Indications: Pulmonary embolism.  Limitations: Diminutive femoral veins. Comparison Study: No prior study on file Performing Technologist: Sharion Dove RVS  Examination Guidelines: A complete evaluation includes B-mode imaging, spectral Doppler, color Doppler, and power Doppler as needed of all accessible portions of each vessel. Bilateral testing is considered an integral part of a complete examination. Limited examinations for reoccurring indications may be performed as noted. The reflux portion of the exam is performed with the patient in reverse Trendelenburg.  +---------+---------------+---------+-----------+----------+--------------+  RIGHT     Compressibility Phasicity Spontaneity Properties Thrombus Aging  +---------+---------------+---------+-----------+----------+--------------+  CFV       Full            Yes       Yes                                     +---------+---------------+---------+-----------+----------+--------------+  SFJ  Full                                                             +---------+---------------+---------+-----------+----------+--------------+  FV Prox   Full                                                             +---------+---------------+---------+-----------+----------+--------------+  FV Mid    Full            Yes       Yes                                    +---------+---------------+---------+-----------+----------+--------------+  FV Distal Full            Yes       Yes                                    +---------+---------------+---------+-----------+----------+--------------+  PFV       Full                                                             +---------+---------------+---------+-----------+----------+--------------+  POP       Full            Yes       Yes                                    +---------+---------------+---------+-----------+----------+--------------+  PTV       Full                                                             +---------+---------------+---------+-----------+----------+--------------+  PERO      Full                                                             +---------+---------------+---------+-----------+----------+--------------+   +---------+---------------+---------+-----------+----------+--------------+  LEFT      Compressibility Phasicity Spontaneity Properties Thrombus Aging  +---------+---------------+---------+-----------+----------+--------------+  CFV       Full            Yes       Yes                                    +---------+---------------+---------+-----------+----------+--------------+  SFJ       Full                                                             +---------+---------------+---------+-----------+----------+--------------+  FV Prox   Full                                                              +---------+---------------+---------+-----------+----------+--------------+  FV Mid    Full            Yes       Yes                                    +---------+---------------+---------+-----------+----------+--------------+  FV Distal Full            Yes       Yes                                    +---------+---------------+---------+-----------+----------+--------------+  PFV       Full                                                             +---------+---------------+---------+-----------+----------+--------------+  POP       Full            Yes       Yes                                    +---------+---------------+---------+-----------+----------+--------------+  PTV       Full                                                             +---------+---------------+---------+-----------+----------+--------------+  PERO      Full                                                             +---------+---------------+---------+-----------+----------+--------------+     Summary: BILATERAL: - No evidence of deep vein thrombosis seen in the lower extremities, bilaterally. -No evidence of popliteal cyst, bilaterally.   *See table(s) above for measurements and observations. Electronically signed by Jamelle Haring on 11/27/2021 at 10:39:47 AM.    Final     Today   Subjective    Dan Freeman today has no headache,no chest abdominal pain,no new  weakness tingling or numbness, feels much better wants to go home today.     Objective   Blood pressure (!) 122/56, pulse 62, temperature 97.6 F (36.4 C), temperature source Oral, resp. rate 17, height 5\' 10"  (1.778 m), weight 85.9 kg, SpO2 95 %.  No intake or output data in the 24 hours ending 11/28/21 0917  Exam  Awake Alert, No new F.N deficits,    .AT,PERRAL Supple Neck,   Symmetrical Chest wall movement, Good air movement bilaterally, CTAB RRR,No Gallops,   +ve B.Sounds, Abd Soft, Non tender,  No Cyanosis, Clubbing or edema    Data Review    CBC w Diff:  Lab Results  Component Value Date   WBC 4.9 11/28/2021   HGB 11.2 (L) 11/28/2021   HCT 33.7 (L) 11/28/2021   PLT 180 11/28/2021   LYMPHOPCT 37 11/28/2021   MONOPCT 11 11/28/2021   EOSPCT 4 11/28/2021   BASOPCT 0 11/28/2021    CMP:  Lab Results  Component Value Date   NA 136 11/28/2021   K 4.4 11/28/2021   CL 106 11/28/2021   CO2 22 11/28/2021   BUN 24 (H) 11/28/2021   CREATININE 1.23 11/28/2021   PROT 6.1 (L) 11/28/2021   ALBUMIN 3.2 (L) 11/28/2021   BILITOT 0.2 (L) 11/28/2021   ALKPHOS 65 11/28/2021   AST 18 11/28/2021   ALT 13 11/28/2021  .   Total Time in preparing paper work, data evaluation and todays exam - 80 minutes  Lala Lund M.D on 11/28/2021 at 9:17 AM  [facility]

## 2021-12-01 ENCOUNTER — Other Ambulatory Visit: Payer: Self-pay

## 2021-12-01 ENCOUNTER — Telehealth: Payer: Self-pay

## 2021-12-01 ENCOUNTER — Encounter: Payer: Self-pay | Admitting: Nurse Practitioner

## 2021-12-01 ENCOUNTER — Ambulatory Visit: Payer: Medicare Other | Admitting: Nurse Practitioner

## 2021-12-01 DIAGNOSIS — Z09 Encounter for follow-up examination after completed treatment for conditions other than malignant neoplasm: Secondary | ICD-10-CM | POA: Diagnosis not present

## 2021-12-01 DIAGNOSIS — I2602 Saddle embolus of pulmonary artery with acute cor pulmonale: Secondary | ICD-10-CM

## 2021-12-01 DIAGNOSIS — J45909 Unspecified asthma, uncomplicated: Secondary | ICD-10-CM | POA: Insufficient documentation

## 2021-12-01 DIAGNOSIS — J4489 Other specified chronic obstructive pulmonary disease: Secondary | ICD-10-CM | POA: Insufficient documentation

## 2021-12-01 DIAGNOSIS — J4540 Moderate persistent asthma, uncomplicated: Secondary | ICD-10-CM

## 2021-12-01 NOTE — Patient Instructions (Addendum)
Continue Trelegy 1 puff daily, brush tongue and rinse mouth afterwards -Continue Albuterol inhaler 2 puffs every 6 hours as needed for shortness of breath or wheezing. Notify if symptoms persist despite rescue inhaler/neb use. -Continue Eliquis 2 tabs Twice daily to complete your six day course then decrease to 5 mg (1 tab) Twice daily. You will be on this for a minimum of 6 months. Monitor for excessive bleeding/bruising and review information packet. -Continue protonix 40 mg daily   Attend PFTs on 2/23  Asthma Action Plan in place Rinse mouth after inhaled corticosteroid use.  Avoid triggers, when able.  Exercise encouraged. Notify if worsening symptoms upon exertion.  Notify and seek help if symptoms unrelieved by rescue inhaler.  Follow up on 2/27 with [facility], as previously scheduled, to review PFTs. If symptoms do not improve or worsen, please contact office for sooner follow up or seek emergency care.

## 2021-12-01 NOTE — Telephone Encounter (Signed)
REFERRAL SCANNED TO REFERRAL...NO. NOTES

## 2021-12-01 NOTE — Assessment & Plan Note (Addendum)
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 Complete starter pack then transition to maintenance therapy at 5 mg Twice daily. Discussed potential side effects and s/s of bleeding. No evidence of right heart strain on echo despite biomarkers. Awaiting hospital follow up with his cardiologist.  Patient Instructions  Continue Trelegy 1 puff daily, brush tongue and rinse mouth afterwards -Continue Albuterol inhaler 2 puffs every 6 hours as needed for shortness of breath or wheezing. Notify if symptoms persist despite rescue inhaler/neb use. -Continue Eliquis 2 tabs Twice daily to complete your six day course then decrease to 5 mg (1 tab) Twice daily. You will be on this for a minimum of 6 months. Monitor for excessive bleeding/bruising and review information packet. -Continue protonix 40 mg daily   Attend PFTs on 2/23  Asthma Action Plan in place Rinse mouth after inhaled corticosteroid use.  Avoid triggers, when able.  Exercise encouraged. Notify if worsening symptoms upon exertion.  Notify and seek help if symptoms unrelieved by rescue inhaler.  Follow up on 2/27 with [facility], as previously scheduled, to review PFTs. If symptoms do not improve or worsen, please contact office for sooner follow up or seek emergency care.

## 2021-12-01 NOTE — Assessment & Plan Note (Signed)
Significant improvement. Feeling well. Follow up 2/23 with PFTs and 2/27 for OV.

## 2021-12-01 NOTE — Progress Notes (Signed)
@Patient  ID: Dan Freeman, male    DOB: 12-09-1930, 88 y.o.   MRN: [id number]  Chief Complaint  Patient presents with   Hospitalization [facility] follow up. Pt states he went to the ER due to a blood clot inhis lungs. Pt states he has been feeling good since leaving the hospital     Referring provider: Maylon Peppers, MD  HPI: 88 year old male, never smoker followed for dyspnea upon exertion, history of asthma, and report of COPD although no prior PFTs.  He is a patient Dr. Kavin Leech and was last seen in office on 11/24/2021.  Past medical history significant for hypertension, hypertrophic obstructive cardiomyopathy, right bundle branch block, first-degree block, hypothyroidism.  TEST/EVENTS:  11/25/2021 CXR 2 view: hyperinflation. Mild cardiomegaly. There is pleuroparenchymal scarring left hemithorax. Calcified granuloma in the left apex.  11/25/2021 CTA chest: Atherosclerosis. Saddle embolus straddling bifurcation of the pulmonary trunk extending into the central and lobar pulmonary artery branches b/l. 1 cm calcified LUL granuloma. Bibasilar linear atelectasis/scarring. Presence of right heart strain.  11/26/2021 echocardiogram: EF 65-70%. Moderate LVH. G1DD. Mild MR. RV function and size normal. Both atriums normal in size  11/24/2021: OV with Dr. Silas Flood.  At previous visit was escalated to Trelegy in the setting of clinical diagnosis of asthma.  Evidence of hyperinflation on previous chest imaging to support this in the setting of never smoker.  Feels some improvement however still pretty dyspneic.  PFTs previously ordered however were never scheduled.  Scheduled at this visit.  Discussed changing inhaler from DPI to HFA in future if not improving.  Suspect dyspnea multifactorial given left atrial dilation and HOCM.  11/25/2021-11/28/2021: Hospital admission for saddle PE and related acute respiratory failure. IR advised anticoagulation over thrombectomy. Started on lovenox and  transitioned to Eliquis upon discharged. Right heart strain on echo. Hx of prostate cancer with neg PSA. Some testicular pain in Jan 2023 - [place] testes with moderate left hydrocele and plans to follow up with urology.   12/01/2021: Today - hospital follow up Patient presents today for hospital follow up for unprovoked PE. He reports feeling better with improvement in his breathing. He still occasionally has DOE but has only had to use his rescue inhaler once since discharge. He is currently on starter pack on Eliquis and taking 10 mg Twice daily with plans to transition to 5 mg Twice daily on Monday. He denies any excessive bleeding/bruising, hematochezia or hematuria. He did have evidence of right heart strain on his echocardiogram. He denies any lower extremity swelling, chest pain, dizziness or palpitations. He continues on Trelegy daily. He has PFTs scheduled for 2/23, which we will keep given his long history of DOE and previous asthma diagnosis.   No Known Allergies  Immunization History  Administered Date(s) Administered   Influenza, High Dose Seasonal PF 10/14/2014, 07/29/2019   Influenza,inj,Quad PF,6+ Mos 08/14/2016, 09/07/2017, 07/08/2018   Influenza-Unspecified 09/30/2008, 10/25/2009, 07/26/2010, 08/15/2011, 08/28/2013, 07/25/2015, 08/14/2016, 08/21/2021   Pneumococcal Conjugate-13 08/14/2016   Pneumococcal Polysaccharide-23 08/02/2011    Past Medical History:  Diagnosis Date   Arthritis    Cancer ([place])     prostrate     radiation   COPD (chronic obstructive pulmonary disease) ([place])    Esophageal diverticulum    s/p esophageal resection   Essential hypertension    HOCM (hypertrophic obstructive cardiomyopathy) ([place])    Not congenital, likely 2/2 uncontrolled HBP   Holosystolic murmur    11/14/84 echo shows LVOT stenosis consistent w/ HOCM  and likely 2/2 uncontrolled HBP     Tobacco History: Social History   Tobacco Use  Smoking Status Never  Smokeless Tobacco Never    Counseling given: Not Answered   Outpatient Medications Prior to Visit  Medication Sig Dispense Refill   albuterol (PROAIR HFA) 108 (90 Base) MCG/ACT inhaler Inhale 2 puffs into the lungs every 6 (six) hours as needed for wheezing or shortness of breath. 1 each 11   albuterol (VENTOLIN HFA) 108 (90 Base) MCG/ACT inhaler Inhale 2 puffs into the lungs every 6 (six) hours as needed for shortness of breath. 1 each 11   apixaban (ELIQUIS) 5 MG TABS tablet Take 2 tablets (10 mg total) by mouth 2 (two) times daily for 6 days, then take 1 tablet (5 mg total) by mouth twice daily. 70 tablet 0   [START ON 12/05/2021] apixaban (ELIQUIS) 5 MG TABS tablet Take 1 tablet (5 mg total) by mouth 2 (two) times daily. 60 tablet 0   b complex vitamins tablet Take 1 tablet by mouth daily.     benzonatate (TESSALON) 200 MG capsule Take 200 mg by mouth 3 (three) times daily as needed for cough.     DM-Doxylamine-Acetaminophen (NYQUIL COLD & FLU PO) Take 1 Dose by mouth 2 (two) times daily as needed (cough, cold).     etodolac (LODINE) 400 MG tablet Take 400 mg by mouth daily.     finasteride (PROSCAR) 5 MG tablet Take 5 mg by mouth daily.     Fluticasone-Umeclidin-Vilant (TRELEGY ELLIPTA) 200-62.5-25 MCG/ACT AEPB Inhale 1 puff into the lungs daily. 60 each 3   gabapentin (NEURONTIN) 100 MG capsule Take 100 mg by mouth 3 (three) times daily.     Levothyroxine Sodium 100 MCG CAPS Take 100 mcg by mouth daily before breakfast.     Misc Natural Products (GINSENG COMPLEX PO) Take 1 capsule by mouth daily.      pantoprazole (PROTONIX) 40 MG tablet Take 1 tablet (40 mg total) by mouth daily. 30 tablet 0   tamsulosin (FLOMAX) 0.4 MG CAPS capsule Take 0.8 mg by mouth daily.     metoprolol succinate (TOPROL-XL) 25 MG 24 hr tablet Take 1 tablet (25 mg total) by mouth daily. Take with or immediately following a meal. (Patient not taking: Reported on 11/26/2021) 90 tablet 3   tiotropium (SPIRIVA HANDIHALER) 18 MCG inhalation  capsule Place 1 capsule (18 mcg total) into inhaler and inhale daily. 30 capsule 2   No facility-administered medications prior to visit.     Review of Systems:   Constitutional: No weight loss or gain, night sweats, fevers, chills, fatigue, or lassitude. HEENT: No headaches, difficulty swallowing, tooth/dental problems, or sore throat. No sneezing, itching, ear ache, nasal congestion, or post nasal drip CV:  No chest pain, orthopnea, PND, swelling in lower extremities, anasarca, dizziness, palpitations, syncope Resp: +shortness of breath with exertion (improved). No excess mucus or change in color of mucus. No productive or non-productive. No hemoptysis. No wheezing.  No chest wall deformity GI:  No heartburn, indigestion, abdominal pain, nausea, vomiting, diarrhea, change in bowel habits, loss of appetite, bloody stools.  GU: No dysuria, change in color of urine, urgency or frequency.  No flank pain, no hematuria  Skin: No rash, lesions, ulcerations MSK:  No joint pain or swelling.  No decreased range of motion.  No back pain. Neuro: No dizziness or lightheadedness.  Psych: No depression or anxiety. Mood stable.     Physical Exam:  BP 128/74 (BP Location: Right  Arm, Patient Position: Sitting, Cuff Size: Normal)    Pulse 69    Temp 98.5 F (36.9 C) (Oral)    Ht 5\' 10"  (1.778 m)    Wt 187 lb 12.8 oz (85.2 kg)    SpO2 97%    BMI 26.95 kg/m   GEN: Pleasant, interactive, well-nourished, well-appearing; in no acute distress. HEENT:  Normocephalic and atraumatic. EACs patent bilaterally. TM pearly gray with present light reflex bilaterally. PERRLA. Sclera white. Nasal turbinates pink, moist and patent bilaterally. No rhinorrhea present. Oropharynx pink and moist, without exudate or edema. No lesions, ulcerations, or postnasal drip.  NECK:  Supple w/ fair ROM. No JVD present. Normal carotid impulses w/o bruits. Thyroid symmetrical with no goiter or nodules palpated. No lymphadenopathy.   CV:  RRR, systolic murmur, no r/g, no peripheral edema. Pulses intact, +2 bilaterally. No cyanosis, pallor or clubbing. PULMONARY:  Unlabored, regular breathing. Clear bilaterally A&P w/o wheezes/rales/rhonchi. No accessory muscle use. No dullness to percussion. GI: BS present and normoactive. Soft, non-tender to palpation. No organomegaly or masses detected. No CVA tenderness. MSK: No erythema, warmth or tenderness. Cap refil <2 sec all extrem. No deformities or joint swelling noted.  Neuro: A/Ox3. No focal deficits noted.   Skin: Warm, no lesions or rashe Psych: Normal affect and behavior. Judgement and thought content appropriate.     Lab Results:  CBC    Component Value Date/Time   WBC 4.9 11/28/2021 0052   RBC 3.98 (L) 11/28/2021 0052   HGB 11.2 (L) 11/28/2021 0052   HCT 33.7 (L) 11/28/2021 0052   PLT 180 11/28/2021 0052   MCV 84.7 11/28/2021 0052   MCH 28.1 11/28/2021 0052   MCHC 33.2 11/28/2021 0052   RDW 15.0 11/28/2021 0052   LYMPHSABS 1.9 11/28/2021 0052   MONOABS 0.5 11/28/2021 0052   EOSABS 0.2 11/28/2021 0052   BASOSABS 0.0 11/28/2021 0052    BMET    Component Value Date/Time   NA 136 11/28/2021 0052   K 4.4 11/28/2021 0052   CL 106 11/28/2021 0052   CO2 22 11/28/2021 0052   GLUCOSE 117 (H) 11/28/2021 0052   BUN 24 (H) 11/28/2021 0052   CREATININE 1.23 11/28/2021 0052   CALCIUM 8.8 (L) 11/28/2021 0052   GFRNONAA 56 (L) 11/28/2021 0052   GFRAA >60 06/29/2018 0422    BNP    Component Value Date/Time   BNP 109.1 (H) 11/28/2021 0052     Imaging:  DG Chest 2 View  Result Date: 11/25/2021 CLINICAL DATA:  Shortness of breath EXAM: CHEST - 2 VIEW COMPARISON:  06/27/2018 FINDINGS: Hyperinflation. Lateral view degraded by patient arm position. Midline trachea. Mild cardiomegaly. Atherosclerosis in the transverse aorta. Pleuroparenchymal scarring at the inferior left hemithorax. Calcified granuloma in the left apex. EKG lead projects over the left upper lobe on  the frontal radiograph. IMPRESSION: Hyperinflation, without acute disease. Aortic Atherosclerosis (ICD10-I70.0). Electronically Signed   By: Abigail Miyamoto M.D.   On: 11/25/2021 15:06   CT HEAD WO CONTRAST (5MM)  Result Date: 11/26/2021 CLINICAL DATA:  Syncope. EXAM: CT HEAD WITHOUT CONTRAST TECHNIQUE: Contiguous axial images were obtained from the base of the skull through the vertex without intravenous contrast. RADIATION DOSE REDUCTION: This exam was performed according to the departmental dose-optimization program which includes automated exposure control, adjustment of the mA and/or kV according to patient size and/or use of iterative reconstruction technique. COMPARISON:  Head CT dated 05/22/2020. FINDINGS: Brain: Mild age-related atrophy and chronic microvascular ischemic changes. There is no acute  intracranial hemorrhage. No mass effect or midline shift no extra-axial fluid collection. Vascular: No hyperdense vessel or unexpected calcification. Skull: Normal. Negative for fracture or focal lesion. Sinuses/Orbits: Mild mucoperiosteal thickening of paranasal sinuses. No air-fluid level. The mastoid air cells are clear. Other: None IMPRESSION: 1. No acute intracranial pathology. 2. Mild age-related atrophy and chronic microvascular ischemic changes. Electronically Signed   By: Anner Crete M.D.   On: 11/26/2021 02:59   CT Angio Chest PE W/Cm &/Or Wo Cm  Result Date: 11/25/2021 CLINICAL DATA:  Concern for pulmonary embolism. EXAM: CT ANGIOGRAPHY CHEST WITH CONTRAST TECHNIQUE: Multidetector CT imaging of the chest was performed using the standard protocol during bolus administration of intravenous contrast. Multiplanar CT image reconstructions and MIPs were obtained to evaluate the vascular anatomy. RADIATION DOSE REDUCTION: This exam was performed according to the departmental dose-optimization program which includes automated exposure control, adjustment of the mA and/or kV according to patient size  and/or use of iterative reconstruction technique. CONTRAST:  15mL OMNIPAQUE IOHEXOL 350 MG/ML SOLN COMPARISON:  Chest CT dated 05/20/2018. FINDINGS: Cardiovascular: There is no cardiomegaly or pericardial effusion. There is mild atherosclerotic calcification of the thoracic aorta. No aneurysmal dilatation. There is a saddle embolus straddling the bifurcation of the pulmonary trunk with standing into the central, and lobar pulmonary artery branches bilaterally. The risks evidence of right heart straining with RV/LV ratio in the range of 1.0-1.25. Mediastinum/Nodes: No hilar or mediastinal adenopathy. The esophagus is grossly unremarkable no mediastinal fluid collection. Lungs/Pleura: A 1 cm calcified left upper lobe granuloma. Bibasilar linear atelectasis/scarring. No focal consolidation, pleural effusion, or pneumothorax. The central airways are patent. Upper Abdomen: No acute abnormality. Musculoskeletal: Old healed left posterior rib fractures. No acute osseous pathology. Prior left rib resection. Review of the MIP images confirms the above findings. IMPRESSION: Positive for acute PE with of right heart strain (RV/LV Ratio = 1.0-1.2) consistent with at least submassive (intermediate risk) PE. The presence of right heart strain has been associated with an increased risk of morbidity and mortality. Aortic Atherosclerosis (ICD10-I70.0). These results were called by telephone at the time of interpretation on 11/25/2021 at 7:20 pm to provider [facility] , who verbally acknowledged these results. Electronically Signed   By: Anner Crete M.D.   On: 11/25/2021 19:27   ECHOCARDIOGRAM COMPLETE  Result Date: 11/26/2021    ECHOCARDIOGRAM REPORT   Patient Name:   EUSTACE HUR Date of Exam: 11/26/2021 Medical Rec #:  [id number]     Height:       70.0 in Accession #:    [id number]    Weight:       184.7 lb Date of Birth:  1931-09-12     BSA:          2.018 m Patient Age:    64 years      BP:           118/78 mmHg  Patient Gender: M             HR:           62 bpm. Exam Location:  Inpatient Procedure: 2D Echo STAT ECHO Indications:    pulmonary embolus  History:        Patient has prior history of Echocardiogram examinations, most                 recent 06/28/2018. Hypertrophic Cardiomyopathy, COPD;                 Arrythmias:RBBB and first  degree av block.  Sonographer:    Johny Chess RDCS Referring Phys: [place]  1. Left ventricular ejection fraction, by estimation, is 65 to 70%. The left ventricle has normal function. The left ventricle has no regional wall motion abnormalities. There is moderate concentric left ventricular hypertrophy. Left ventricular diastolic parameters are consistent with Grade I diastolic dysfunction (impaired relaxation).  2. There appears to be mild systolic anterior motion of the anterior mitral valve leaflet with trivial to mild MR. LVOT gradient 64mmHg. This is improved from prior TTE in 2019 where there was severe SAM with a mean gradient of 85mmHg.  3. Right ventricular systolic function is normal. The right ventricular size is normal. Tricuspid regurgitation signal is inadequate for assessing PA pressure.  4. The mitral valve is normal in structure. Mild mitral valve regurgitation. There is mild SAM as detailed above  5. The aortic valve is tricuspid. There is mild thickening of the aortic valve. Aortic valve regurgitation is not visualized. Aortic valve sclerosis/calcification is present, without any evidence of aortic stenosis.  6. The inferior vena cava is normal in size with greater than 50% respiratory variability, suggesting right atrial pressure of 3 mmHg. Comparison(s): Compared to prior TTE in 2019, the SAM appears mild on current study with amean gradient of 60mmHg compared to 63mmHg on last study. Otherwise, there is no significant change. FINDINGS  Left Ventricle: Left ventricular ejection fraction, by estimation, is 65 to 70%. The left ventricle has  normal function. The left ventricle has no regional wall motion abnormalities. The left ventricular internal cavity size was normal in size. There is  moderate concentric left ventricular hypertrophy. Left ventricular diastolic parameters are consistent with Grade I diastolic dysfunction (impaired relaxation). Right Ventricle: The right ventricular size is normal. Right vetricular wall thickness was not well visualized. Right ventricular systolic function is normal. Tricuspid regurgitation signal is inadequate for assessing PA pressure. Left Atrium: Left atrial size was normal in size. Right Atrium: Right atrial size was normal in size. Pericardium: There is no evidence of pericardial effusion. Mitral Valve: Mild SAM. The mitral valve is normal in structure. Mild mitral valve regurgitation. Tricuspid Valve: The tricuspid valve is normal in structure. Tricuspid valve regurgitation is trivial. Aortic Valve: The aortic valve is tricuspid. There is mild thickening of the aortic valve. Aortic valve regurgitation is not visualized. Aortic valve sclerosis/calcification is present, without any evidence of aortic stenosis. Pulmonic Valve: The pulmonic valve was normal in structure. Pulmonic valve regurgitation is trivial. Aorta: The aortic root and ascending aorta are structurally normal, with no evidence of dilitation. Venous: The inferior vena cava is normal in size with greater than 50% respiratory variability, suggesting right atrial pressure of 3 mmHg. IAS/Shunts: No atrial level shunt detected by color flow Doppler.  LEFT VENTRICLE PLAX 2D LVIDd:         4.70 cm   Diastology LVIDs:         3.10 cm   LV e' medial:    4.13 cm/s LV PW:         1.40 cm   LV E/e' medial:  12.3 LV IVS:        1.10 cm   LV e' lateral:   6.42 cm/s LVOT diam:     1.90 cm   LV E/e' lateral: 7.9 LV SV:         100 LV SV Index:   49 LVOT Area:     2.84 cm  RIGHT VENTRICLE  IVC RV S prime:     11.00 cm/s  IVC diam: 2.20 cm TAPSE  (M-mode): 1.9 cm LEFT ATRIUM             Index        RIGHT ATRIUM           Index LA diam:        3.60 cm 1.78 cm/m   RA Area:     13.80 cm LA Vol (A2C):   34.2 ml 16.95 ml/m  RA Volume:   30.70 ml  15.21 ml/m LA Vol (A4C):   46.2 ml 22.89 ml/m LA Biplane Vol: 41.5 ml 20.56 ml/m  AORTIC VALVE LVOT Vmax:   143.00 cm/s LVOT Vmean:  94.500 cm/s LVOT VTI:    0.351 m  AORTA Ao Root diam: 3.10 cm Ao Asc diam:  3.00 cm MITRAL VALVE MV Area (PHT): 2.56 cm    SHUNTS MV Decel Time: 296 msec    Systemic VTI:  0.35 m MV E velocity: 51.00 cm/s  Systemic Diam: 1.90 cm MV A velocity: 93.40 cm/s MV E/A ratio:  0.55 Gwyndolyn Kaufman MD Electronically signed by Gwyndolyn Kaufman MD Signature Date/Time: 11/26/2021/9:44:43 AM    Final    US SCROTUM W/DOPPLER  Result Date: 11/25/2021 CLINICAL DATA:  Left scrotal swelling EXAM: SCROTAL ULTRASOUND DOPPLER ULTRASOUND OF THE TESTICLES TECHNIQUE: Complete ultrasound examination of the testicles, epididymis, and other scrotal structures was performed. Color and spectral Doppler ultrasound were also utilized to evaluate blood flow to the testicles. COMPARISON:  None. FINDINGS: Right testicle Measurements: 3.1 x 1.7 x 2.4 cm.  No mass.  Limited microlithiasis. Left testicle Measurements: 3.0 x 1.9 x 2.1 cm. No mass. Limited microlithiasis. Right epididymis:  Normal in size and appearance. Left epididymis:  Normal in size and appearance. Hydrocele:  Moderate left hydrocele. Varicocele:  None visualized. Pulsed Doppler interrogation of both testes demonstrates normal low resistance arterial and venous waveforms bilaterally. IMPRESSION: Moderate left hydrocele. Otherwise negative scrotal ultrasound. No evidence of testicular torsion. Electronically Signed   By: Julian Hy M.D.   On: 11/25/2021 22:20   VAS [place] LOWER EXTREMITY VENOUS (DVT)  Result Date: 11/27/2021  Lower Venous DVT Study Patient Name:  AMANTE FOMBY  Date of Exam:   11/26/2021 Medical Rec #: [id number]       Accession #:    [id number] Date of Birth: Jun 10, 1931      Patient Gender: M Patient Age:   24 years Exam Location:  [facility] Procedure:      VAS [place] LOWER EXTREMITY VENOUS (DVT) Referring Phys: Andres Labrum --------------------------------------------------------------------------------  Indications: Pulmonary embolism.  Limitations: Diminutive femoral veins. Comparison Study: No prior study on file Performing Technologist: Sharion Dove RVS  Examination Guidelines: A complete evaluation includes B-mode imaging, spectral Doppler, color Doppler, and power Doppler as needed of all accessible portions of each vessel. Bilateral testing is considered an integral part of a complete examination. Limited examinations for reoccurring indications may be performed as noted. The reflux portion of the exam is performed with the patient in reverse Trendelenburg.  +---------+---------------+---------+-----------+----------+--------------+  RIGHT     Compressibility Phasicity Spontaneity Properties Thrombus Aging  +---------+---------------+---------+-----------+----------+--------------+  CFV       Full            Yes       Yes                                    +---------+---------------+---------+-----------+----------+--------------+  SFJ       Full                                                             +---------+---------------+---------+-----------+----------+--------------+  FV Prox   Full                                                             +---------+---------------+---------+-----------+----------+--------------+  FV Mid    Full            Yes       Yes                                    +---------+---------------+---------+-----------+----------+--------------+  FV Distal Full            Yes       Yes                                    +---------+---------------+---------+-----------+----------+--------------+  PFV       Full                                                              +---------+---------------+---------+-----------+----------+--------------+  POP       Full            Yes       Yes                                    +---------+---------------+---------+-----------+----------+--------------+  PTV       Full                                                             +---------+---------------+---------+-----------+----------+--------------+  PERO      Full                                                             +---------+---------------+---------+-----------+----------+--------------+   +---------+---------------+---------+-----------+----------+--------------+  LEFT      Compressibility Phasicity Spontaneity Properties Thrombus Aging  +---------+---------------+---------+-----------+----------+--------------+  CFV       Full            Yes       Yes                                    +---------+---------------+---------+-----------+----------+--------------+  SFJ       Full                                                             +---------+---------------+---------+-----------+----------+--------------+  FV Prox   Full                                                             +---------+---------------+---------+-----------+----------+--------------+  FV Mid    Full            Yes       Yes                                    +---------+---------------+---------+-----------+----------+--------------+  FV Distal Full            Yes       Yes                                    +---------+---------------+---------+-----------+----------+--------------+  PFV       Full                                                             +---------+---------------+---------+-----------+----------+--------------+  POP       Full            Yes       Yes                                    +---------+---------------+---------+-----------+----------+--------------+  PTV       Full                                                              +---------+---------------+---------+-----------+----------+--------------+  PERO      Full                                                             +---------+---------------+---------+-----------+----------+--------------+     Summary: BILATERAL: - No evidence of deep vein thrombosis seen in the lower extremities, bilaterally. -No evidence of popliteal cyst, bilaterally.   *See table(s) above for measurements and observations. Electronically signed by Jamelle Haring on 11/27/2021 at 10:39:47 AM.    Final       No flowsheet data found.  No results found for: NITRICOXIDE  Assessment & Plan:   Pulmonary embolism (HCC) 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 Complete starter pack then transition to maintenance therapy at 5 mg Twice daily. Discussed potential side effects and s/s of bleeding. No evidence of right heart strain on echo despite biomarkers. Awaiting hospital follow up with his cardiologist.  Patient Instructions  Continue Trelegy 1 puff daily, brush tongue and rinse mouth afterwards -Continue Albuterol inhaler 2 puffs every 6 hours as needed for shortness of breath or wheezing. Notify if symptoms persist despite rescue inhaler/neb use. -Continue Eliquis 2 tabs Twice daily to complete your six day course then decrease to 5 mg (1 tab) Twice daily. You will be on this for a minimum of 6 months. Monitor for excessive bleeding/bruising and review information packet. -Continue protonix 40 mg daily   Attend PFTs on 2/23  Asthma Action Plan in place Rinse mouth after inhaled corticosteroid use.  Avoid triggers, when able.  Exercise encouraged. Notify if worsening symptoms upon exertion.  Notify and seek help if symptoms unrelieved by rescue inhaler.  Follow up on 2/27 with [facility], as previously scheduled, to review PFTs. If symptoms do not improve or worsen, please contact office for sooner  follow up or seek emergency care.    Asthma Hx of asthma with longstanding issues of DOE. Recent saddle PE. PFTs previously scheduled for 2/23. Continue Trelegy and PRN albuterol.   Hospital discharge follow-up Significant improvement. Feeling well. Follow up 2/23 with PFTs and 2/27 for OV.    Clayton Bibles, NP 12/01/2021  Pt aware and understands NP's role.

## 2021-12-01 NOTE — Assessment & Plan Note (Signed)
Hx of asthma with longstanding issues of DOE. Recent saddle PE. PFTs previously scheduled for 2/23. Continue Trelegy and PRN albuterol.

## 2021-12-02 ENCOUNTER — Telehealth: Payer: Self-pay | Admitting: Pharmacist

## 2021-12-02 NOTE — Telephone Encounter (Signed)
Pharmacy Transitions of Care Follow-up Telephone Call  Date of discharge: 11/28/21  Discharge Diagnosis: PE  How have you been since you were released from the hospital? I spoke with the patient and his wife. He has been doing well since discharge. He had a follow up with his pulmonologist yesterday.   Medication changes made at discharge:  - START: Eliquis  - STOPPED: ASA  - CHANGED: n/a  Medication changes verified by the patient? Yes     Medication Accessibility:  Home Pharmacy: n/a   Was the patient provided with refills on discharged medications? yes   Have all prescriptions been transferred from [facility] to home pharmacy? no   Is the patient able to afford medications? N/a Notable copays: n/a Eligible patient assistance: n/a    Medication Review: APIXABAN (ELIQUIS)  Apixaban 10 mg BID initiated on 11/28/21. Will switch to apixaban 5 mg BID after 7 days (DATE 12/05/21).  - Discussed importance of taking medication around the same time everyday  - Reviewed potential DDIs with patient  - Advised patient of medications to avoid (NSAIDs, ASA)  - Educated that Tylenol (acetaminophen) will be the preferred analgesic to prevent risk of bleeding  - Emphasized importance of monitoring for signs and symptoms of bleeding (abnormal bruising, prolonged bleeding, nose bleeds, bleeding from gums, discolored urine, black tarry stools)  - Advised patient to alert all providers of anticoagulation therapy prior to starting a new medication or having a procedure    Follow-up Appointments:  PCP Hospital f/u appt confirmed? Yes Scheduled to see primary care on 2/20 @ .   [facility] f/u appt confirmed? No, patient is waiting for a return call from Cardiology for follow up visit.  If their condition worsens, is the pt aware to call PCP or go to the Emergency Dept.? Yes  Final Patient Assessment: Patient and his wife understood when to start new dose of Eliquis. Patient did state he was  having some itching that started today and asked if it was due to the Eliquis. He denies any new or worsening shortness of breath, no hives and redness and no itching in the mouth.  Patient did state benadryl did help. I encouraged them to call his PCP or cardiologist to determine if an allergy to Eliquis.

## 2021-12-03 ENCOUNTER — Other Ambulatory Visit (HOSPITAL_COMMUNITY): Payer: Self-pay

## 2021-12-08 ENCOUNTER — Ambulatory Visit (INDEPENDENT_AMBULATORY_CARE_PROVIDER_SITE_OTHER): Payer: Medicare Other | Admitting: Pulmonary Disease

## 2021-12-08 ENCOUNTER — Other Ambulatory Visit: Payer: Self-pay

## 2021-12-08 DIAGNOSIS — R0609 Other forms of dyspnea: Secondary | ICD-10-CM | POA: Diagnosis not present

## 2021-12-08 DIAGNOSIS — J4540 Moderate persistent asthma, uncomplicated: Secondary | ICD-10-CM

## 2021-12-08 LAB — PULMONARY FUNCTION TEST
DL/VA % pred: 123 %
DL/VA: 4.65 ml/min/mmHg/L
DLCO cor % pred: 84 %
DLCO cor: 19.83 ml/min/mmHg
DLCO unc % pred: 75 %
DLCO unc: 17.63 ml/min/mmHg
FEF 25-75 Post: 0.93 L/sec
FEF 25-75 Pre: 0.96 L/sec
FEF2575-%Change-Post: -2 %
FEF2575-%Pred-Post: 54 %
FEF2575-%Pred-Pre: 55 %
FEV1-%Change-Post: 0 %
FEV1-%Pred-Post: 66 %
FEV1-%Pred-Pre: 66 %
FEV1-Post: 1.66 L
FEV1-Pre: 1.65 L
FEV1FVC-%Change-Post: 0 %
FEV1FVC-%Pred-Pre: 91 %
FEV6-%Change-Post: 0 %
FEV6-%Pred-Post: 75 %
FEV6-%Pred-Pre: 75 %
FEV6-Post: 2.46 L
FEV6-Pre: 2.47 L
FEV6FVC-%Change-Post: 0 %
FEV6FVC-%Pred-Post: 106 %
FEV6FVC-%Pred-Pre: 106 %
FVC-%Change-Post: 0 %
FVC-%Pred-Post: 70 %
FVC-%Pred-Pre: 70 %
FVC-Post: 2.47 L
FVC-Pre: 2.47 L
Post FEV1/FVC ratio: 67 %
Post FEV6/FVC ratio: 100 %
Pre FEV1/FVC ratio: 67 %
Pre FEV6/FVC Ratio: 100 %
RV % pred: 124 %
RV: 3.52 L
TLC % pred: 82 %
TLC: 5.82 L

## 2021-12-08 NOTE — Progress Notes (Signed)
Full PFT performed today. °

## 2021-12-08 NOTE — Patient Instructions (Signed)
Full PFT performed today. °

## 2021-12-09 ENCOUNTER — Ambulatory Visit (HOSPITAL_BASED_OUTPATIENT_CLINIC_OR_DEPARTMENT_OTHER): Payer: Medicare Other | Admitting: Cardiology

## 2021-12-12 ENCOUNTER — Ambulatory Visit: Payer: Medicare Other | Admitting: Nurse Practitioner

## 2021-12-12 NOTE — Progress Notes (Incomplete)
@Patient  ID: Dan Freeman, male    DOB: 1931/05/29, 88 y.o.   MRN: [id number]  No chief complaint on file.   Referring provider: Maylon Peppers, MD  HPI: 88 year old male, never smoker followed for dyspnea upon exertion, history of asthma and report of COPD although no prior PFTs. He is a patient of Dr. Kavin Leech and was last seen in office on 12/01/2021. Past medical history significant for HTN, hypertrophic obstructive cardiomyopathy, right BBB, first-degree block, hypothyroidism.   TEST/EVENTS:  11/25/2021 CXR 2 view: hyperinflation. Mild cardiomegaly. There is pleuroparenchymal scarring left hemithorax. Calcified granuloma in the left apex.  11/25/2021 CTA chest: Atherosclerosis. Saddle embolus straddling bifurcation of the pulmonary trunk extending into the central and lobar pulmonary artery branches b/l. 1 cm calcified LUL granuloma. Bibasilar linear atelectasis/scarring. Presence of right heart strain  11/26/2021 echocardiogram: EF 65-70%. Moderate  No Known Allergies  Immunization History  Administered Date(s) Administered   Influenza, High Dose Seasonal PF 10/14/2014, 07/29/2019   Influenza,inj,Quad PF,6+ Mos 08/14/2016, 09/07/2017, 07/08/2018   Influenza-Unspecified 09/30/2008, 10/25/2009, 07/26/2010, 08/15/2011, 08/28/2013, 07/25/2015, 08/14/2016, 08/21/2021   Pneumococcal Conjugate-13 08/14/2016   Pneumococcal Polysaccharide-23 08/02/2011    Past Medical History:  Diagnosis Date   Arthritis    Cancer ([place])     prostrate     radiation   COPD (chronic obstructive pulmonary disease) ([place])    Esophageal diverticulum    s/p esophageal resection   Essential hypertension    HOCM (hypertrophic obstructive cardiomyopathy) ([place])    Not congenital, likely 2/2 uncontrolled HBP   Holosystolic murmur    1/60/73 echo shows LVOT stenosis consistent w/ HOCM and likely 2/2 uncontrolled HBP     Tobacco History: Social History   Tobacco Use  Smoking Status Never  Smokeless  Tobacco Never   Counseling given: Not Answered   Outpatient Medications Prior to Visit  Medication Sig Dispense Refill   albuterol (PROAIR HFA) 108 (90 Base) MCG/ACT inhaler Inhale 2 puffs into the lungs every 6 (six) hours as needed for wheezing or shortness of breath. 1 each 11   albuterol (VENTOLIN HFA) 108 (90 Base) MCG/ACT inhaler Inhale 2 puffs into the lungs every 6 (six) hours as needed for shortness of breath. 1 each 11   apixaban (ELIQUIS) 5 MG TABS tablet Take 2 tablets (10 mg total) by mouth 2 (two) times daily for 6 days, then take 1 tablet (5 mg total) by mouth twice daily. 70 tablet 0   apixaban (ELIQUIS) 5 MG TABS tablet Take 1 tablet (5 mg total) by mouth 2 (two) times daily. 60 tablet 0   b complex vitamins tablet Take 1 tablet by mouth daily.     benzonatate (TESSALON) 200 MG capsule Take 200 mg by mouth 3 (three) times daily as needed for cough.     DM-Doxylamine-Acetaminophen (NYQUIL COLD & FLU PO) Take 1 Dose by mouth 2 (two) times daily as needed (cough, cold).     etodolac (LODINE) 400 MG tablet Take 400 mg by mouth daily.     finasteride (PROSCAR) 5 MG tablet Take 5 mg by mouth daily.     Fluticasone-Umeclidin-Vilant (TRELEGY ELLIPTA) 200-62.5-25 MCG/ACT AEPB Inhale 1 puff into the lungs daily. 60 each 3   gabapentin (NEURONTIN) 100 MG capsule Take 100 mg by mouth 3 (three) times daily.     Levothyroxine Sodium 100 MCG CAPS Take 100 mcg by mouth daily before breakfast.     metoprolol succinate (TOPROL-XL) 25 MG 24 hr tablet Take 1 tablet (  25 mg total) by mouth daily. Take with or immediately following a meal. (Patient not taking: Reported on 11/26/2021) 90 tablet 3   Misc Natural Products (GINSENG COMPLEX PO) Take 1 capsule by mouth daily.      pantoprazole (PROTONIX) 40 MG tablet Take 1 tablet (40 mg total) by mouth daily. 30 tablet 0   tamsulosin (FLOMAX) 0.4 MG CAPS capsule Take 0.8 mg by mouth daily.     tiotropium (SPIRIVA HANDIHALER) 18 MCG inhalation capsule  Place 1 capsule (18 mcg total) into inhaler and inhale daily. 30 capsule 2   No facility-administered medications prior to visit.     Review of Systems:   Constitutional: No weight loss or gain, night sweats, fevers, chills, fatigue, or lassitude. HEENT: No headaches, difficulty swallowing, tooth/dental problems, or sore throat. No sneezing, itching, ear ache, nasal congestion, or post nasal drip CV:  No chest pain, orthopnea, PND, swelling in lower extremities, anasarca, dizziness, palpitations, syncope Resp: No shortness of breath with exertion or at rest. No excess mucus or change in color of mucus. No productive or non-productive. No hemoptysis. No wheezing.  No chest wall deformity GI:  No heartburn, indigestion, abdominal pain, nausea, vomiting, diarrhea, change in bowel habits, loss of appetite, bloody stools.  GU: No dysuria, change in color of urine, urgency or frequency.  No flank pain, no hematuria  Skin: No rash, lesions, ulcerations MSK:  No joint pain or swelling.  No decreased range of motion.  No back pain. Neuro: No dizziness or lightheadedness.  Psych: No depression or anxiety. Mood stable.     Physical Exam:  There were no vitals taken for this visit.  GEN: Pleasant, interactive, well-nourished/chronically-ill appearing/acutely-ill appearing/poorly-nourished/morbidly obese; in no acute distress.****** HEENT:  Normocephalic and atraumatic. EACs patent bilaterally. TM pearly gray with present light reflex bilaterally. PERRLA. Sclera white. Nasal turbinates pink, moist and patent bilaterally. No rhinorrhea present. Oropharynx pink and moist, without exudate or edema. No lesions, ulcerations, or postnasal drip.  NECK:  Supple w/ fair ROM. No JVD present. Normal carotid impulses w/o bruits. Thyroid symmetrical with no goiter or nodules palpated. No lymphadenopathy.   CV: RRR, no m/r/g, no peripheral edema. Pulses intact, +2 bilaterally. No cyanosis, pallor or  clubbing. PULMONARY:  Unlabored, regular breathing. Clear bilaterally A&P w/o wheezes/rales/rhonchi. No accessory muscle use. No dullness to percussion. GI: BS present and normoactive. Soft, non-tender to palpation. No organomegaly or masses detected. No CVA tenderness. MSK: No erythema, warmth or tenderness. Cap refil <2 sec all extrem. No deformities or joint swelling noted.  Neuro: A/Ox3. No focal deficits noted.   Skin: Warm, no lesions or rashe Psych: Normal affect and behavior. Judgement and thought content appropriate.     Lab Results:  CBC    Component Value Date/Time   WBC 4.9 11/28/2021 0052   RBC 3.98 (L) 11/28/2021 0052   HGB 11.2 (L) 11/28/2021 0052   HCT 33.7 (L) 11/28/2021 0052   PLT 180 11/28/2021 0052   MCV 84.7 11/28/2021 0052   MCH 28.1 11/28/2021 0052   MCHC 33.2 11/28/2021 0052   RDW 15.0 11/28/2021 0052   LYMPHSABS 1.9 11/28/2021 0052   MONOABS 0.5 11/28/2021 0052   EOSABS 0.2 11/28/2021 0052   BASOSABS 0.0 11/28/2021 0052    BMET    Component Value Date/Time   NA 136 11/28/2021 0052   K 4.4 11/28/2021 0052   CL 106 11/28/2021 0052   CO2 22 11/28/2021 0052   GLUCOSE 117 (H) 11/28/2021 0052   BUN  24 (H) 11/28/2021 0052   CREATININE 1.23 11/28/2021 0052   CALCIUM 8.8 (L) 11/28/2021 0052   GFRNONAA 56 (L) 11/28/2021 0052   GFRAA >60 06/29/2018 0422    BNP    Component Value Date/Time   BNP 109.1 (H) 11/28/2021 0052     Imaging:  DG Chest 2 View  Result Date: 11/25/2021 CLINICAL DATA:  Shortness of breath EXAM: CHEST - 2 VIEW COMPARISON:  06/27/2018 FINDINGS: Hyperinflation. Lateral view degraded by patient arm position. Midline trachea. Mild cardiomegaly. Atherosclerosis in the transverse aorta. Pleuroparenchymal scarring at the inferior left hemithorax. Calcified granuloma in the left apex. EKG lead projects over the left upper lobe on the frontal radiograph. IMPRESSION: Hyperinflation, without acute disease. Aortic Atherosclerosis  (ICD10-I70.0). Electronically Signed   By: Abigail Miyamoto M.D.   On: 11/25/2021 15:06   CT HEAD WO CONTRAST (5MM)  Result Date: 11/26/2021 CLINICAL DATA:  Syncope. EXAM: CT HEAD WITHOUT CONTRAST TECHNIQUE: Contiguous axial images were obtained from the base of the skull through the vertex without intravenous contrast. RADIATION DOSE REDUCTION: This exam was performed according to the departmental dose-optimization program which includes automated exposure control, adjustment of the mA and/or kV according to patient size and/or use of iterative reconstruction technique. COMPARISON:  Head CT dated 05/22/2020. FINDINGS: Brain: Mild age-related atrophy and chronic microvascular ischemic changes. There is no acute intracranial hemorrhage. No mass effect or midline shift no extra-axial fluid collection. Vascular: No hyperdense vessel or unexpected calcification. Skull: Normal. Negative for fracture or focal lesion. Sinuses/Orbits: Mild mucoperiosteal thickening of paranasal sinuses. No air-fluid level. The mastoid air cells are clear. Other: None IMPRESSION: 1. No acute intracranial pathology. 2. Mild age-related atrophy and chronic microvascular ischemic changes. Electronically Signed   By: Anner Crete M.D.   On: 11/26/2021 02:59   CT Angio Chest PE W/Cm &/Or Wo Cm  Result Date: 11/25/2021 CLINICAL DATA:  Concern for pulmonary embolism. EXAM: CT ANGIOGRAPHY CHEST WITH CONTRAST TECHNIQUE: Multidetector CT imaging of the chest was performed using the standard protocol during bolus administration of intravenous contrast. Multiplanar CT image reconstructions and MIPs were obtained to evaluate the vascular anatomy. RADIATION DOSE REDUCTION: This exam was performed according to the departmental dose-optimization program which includes automated exposure control, adjustment of the mA and/or kV according to patient size and/or use of iterative reconstruction technique. CONTRAST:  86mL OMNIPAQUE IOHEXOL 350 MG/ML SOLN  COMPARISON:  Chest CT dated 05/20/2018. FINDINGS: Cardiovascular: There is no cardiomegaly or pericardial effusion. There is mild atherosclerotic calcification of the thoracic aorta. No aneurysmal dilatation. There is a saddle embolus straddling the bifurcation of the pulmonary trunk with standing into the central, and lobar pulmonary artery branches bilaterally. The risks evidence of right heart straining with RV/LV ratio in the range of 1.0-1.25. Mediastinum/Nodes: No hilar or mediastinal adenopathy. The esophagus is grossly unremarkable no mediastinal fluid collection. Lungs/Pleura: A 1 cm calcified left upper lobe granuloma. Bibasilar linear atelectasis/scarring. No focal consolidation, pleural effusion, or pneumothorax. The central airways are patent. Upper Abdomen: No acute abnormality. Musculoskeletal: Old healed left posterior rib fractures. No acute osseous pathology. Prior left rib resection. Review of the MIP images confirms the above findings. IMPRESSION: Positive for acute PE with of right heart strain (RV/LV Ratio = 1.0-1.2) consistent with at least submassive (intermediate risk) PE. The presence of right heart strain has been associated with an increased risk of morbidity and mortality. Aortic Atherosclerosis (ICD10-I70.0). These results were called by telephone at the time of interpretation on 11/25/2021 at 7:20 pm  to provider [facility] , who verbally acknowledged these results. Electronically Signed   By: Anner Crete M.D.   On: 11/25/2021 19:27   ECHOCARDIOGRAM COMPLETE  Result Date: 11/26/2021    ECHOCARDIOGRAM REPORT   Patient Name:   Dan Freeman Date of Exam: 11/26/2021 Medical Rec #:  [id number]     Height:       70.0 in Accession #:    [id number]    Weight:       184.7 lb Date of Birth:  04-29-1931     BSA:          2.018 m Patient Age:    77 years      BP:           118/78 mmHg Patient Gender: M             HR:           62 bpm. Exam Location:  Inpatient Procedure: 2D Echo STAT  ECHO Indications:    pulmonary embolus  History:        Patient has prior history of Echocardiogram examinations, most                 recent 06/28/2018. Hypertrophic Cardiomyopathy, COPD;                 Arrythmias:RBBB and first degree av block.  Sonographer:    Johny Chess RDCS Referring Phys: [place]  1. Left ventricular ejection fraction, by estimation, is 65 to 70%. The left ventricle has normal function. The left ventricle has no regional wall motion abnormalities. There is moderate concentric left ventricular hypertrophy. Left ventricular diastolic parameters are consistent with Grade I diastolic dysfunction (impaired relaxation).  2. There appears to be mild systolic anterior motion of the anterior mitral valve leaflet with trivial to mild MR. LVOT gradient 15mmHg. This is improved from prior TTE in 2019 where there was severe SAM with a mean gradient of 92mmHg.  3. Right ventricular systolic function is normal. The right ventricular size is normal. Tricuspid regurgitation signal is inadequate for assessing PA pressure.  4. The mitral valve is normal in structure. Mild mitral valve regurgitation. There is mild SAM as detailed above  5. The aortic valve is tricuspid. There is mild thickening of the aortic valve. Aortic valve regurgitation is not visualized. Aortic valve sclerosis/calcification is present, without any evidence of aortic stenosis.  6. The inferior vena cava is normal in size with greater than 50% respiratory variability, suggesting right atrial pressure of 3 mmHg. Comparison(s): Compared to prior TTE in 2019, the SAM appears mild on current study with amean gradient of 69mmHg compared to 4mmHg on last study. Otherwise, there is no significant change. FINDINGS  Left Ventricle: Left ventricular ejection fraction, by estimation, is 65 to 70%. The left ventricle has normal function. The left ventricle has no regional wall motion abnormalities. The left ventricular  internal cavity size was normal in size. There is  moderate concentric left ventricular hypertrophy. Left ventricular diastolic parameters are consistent with Grade I diastolic dysfunction (impaired relaxation). Right Ventricle: The right ventricular size is normal. Right vetricular wall thickness was not well visualized. Right ventricular systolic function is normal. Tricuspid regurgitation signal is inadequate for assessing PA pressure. Left Atrium: Left atrial size was normal in size. Right Atrium: Right atrial size was normal in size. Pericardium: There is no evidence of pericardial effusion. Mitral Valve: Mild SAM. The mitral valve is normal in structure. Mild mitral  valve regurgitation. Tricuspid Valve: The tricuspid valve is normal in structure. Tricuspid valve regurgitation is trivial. Aortic Valve: The aortic valve is tricuspid. There is mild thickening of the aortic valve. Aortic valve regurgitation is not visualized. Aortic valve sclerosis/calcification is present, without any evidence of aortic stenosis. Pulmonic Valve: The pulmonic valve was normal in structure. Pulmonic valve regurgitation is trivial. Aorta: The aortic root and ascending aorta are structurally normal, with no evidence of dilitation. Venous: The inferior vena cava is normal in size with greater than 50% respiratory variability, suggesting right atrial pressure of 3 mmHg. IAS/Shunts: No atrial level shunt detected by color flow Doppler.  LEFT VENTRICLE PLAX 2D LVIDd:         4.70 cm   Diastology LVIDs:         3.10 cm   LV e' medial:    4.13 cm/s LV PW:         1.40 cm   LV E/e' medial:  12.3 LV IVS:        1.10 cm   LV e' lateral:   6.42 cm/s LVOT diam:     1.90 cm   LV E/e' lateral: 7.9 LV SV:         100 LV SV Index:   49 LVOT Area:     2.84 cm  RIGHT VENTRICLE             IVC RV S prime:     11.00 cm/s  IVC diam: 2.20 cm TAPSE (M-mode): 1.9 cm LEFT ATRIUM             Index        RIGHT ATRIUM           Index LA diam:        3.60 cm  1.78 cm/m   RA Area:     13.80 cm LA Vol (A2C):   34.2 ml 16.95 ml/m  RA Volume:   30.70 ml  15.21 ml/m LA Vol (A4C):   46.2 ml 22.89 ml/m LA Biplane Vol: 41.5 ml 20.56 ml/m  AORTIC VALVE LVOT Vmax:   143.00 cm/s LVOT Vmean:  94.500 cm/s LVOT VTI:    0.351 m  AORTA Ao Root diam: 3.10 cm Ao Asc diam:  3.00 cm MITRAL VALVE MV Area (PHT): 2.56 cm    SHUNTS MV Decel Time: 296 msec    Systemic VTI:  0.35 m MV E velocity: 51.00 cm/s  Systemic Diam: 1.90 cm MV A velocity: 93.40 cm/s MV E/A ratio:  0.55 Gwyndolyn Kaufman MD Electronically signed by Gwyndolyn Kaufman MD Signature Date/Time: 11/26/2021/9:44:43 AM    Final    US SCROTUM W/DOPPLER  Result Date: 11/25/2021 CLINICAL DATA:  Left scrotal swelling EXAM: SCROTAL ULTRASOUND DOPPLER ULTRASOUND OF THE TESTICLES TECHNIQUE: Complete ultrasound examination of the testicles, epididymis, and other scrotal structures was performed. Color and spectral Doppler ultrasound were also utilized to evaluate blood flow to the testicles. COMPARISON:  None. FINDINGS: Right testicle Measurements: 3.1 x 1.7 x 2.4 cm.  No mass.  Limited microlithiasis. Left testicle Measurements: 3.0 x 1.9 x 2.1 cm. No mass. Limited microlithiasis. Right epididymis:  Normal in size and appearance. Left epididymis:  Normal in size and appearance. Hydrocele:  Moderate left hydrocele. Varicocele:  None visualized. Pulsed Doppler interrogation of both testes demonstrates normal low resistance arterial and venous waveforms bilaterally. IMPRESSION: Moderate left hydrocele. Otherwise negative scrotal ultrasound. No evidence of testicular torsion. Electronically Signed   By: Julian Hy M.D.   On:  11/25/2021 22:20   VAS [place] LOWER EXTREMITY VENOUS (DVT)  Result Date: 11/27/2021  Lower Venous DVT Study Patient Name:  Dan Freeman  Date of Exam:   11/26/2021 Medical Rec #: [id number]      Accession #:    [id number] Date of Birth: [birth year]      Patient Gender: M Patient Age:   79 years Exam  Location:  [facility] Procedure:      VAS [place] LOWER EXTREMITY VENOUS (DVT) Referring Phys: Andres Labrum --------------------------------------------------------------------------------  Indications: Pulmonary embolism.  Limitations: Diminutive femoral veins. Comparison Study: No prior study on file Performing Technologist: Sharion Dove RVS  Examination Guidelines: A complete evaluation includes B-mode imaging, spectral Doppler, color Doppler, and power Doppler as needed of all accessible portions of each vessel. Bilateral testing is considered an integral part of a complete examination. Limited examinations for reoccurring indications may be performed as noted. The reflux portion of the exam is performed with the patient in reverse Trendelenburg.  +---------+---------------+---------+-----------+----------+--------------+  RIGHT     Compressibility Phasicity Spontaneity Properties Thrombus Aging  +---------+---------------+---------+-----------+----------+--------------+  CFV       Full            Yes       Yes                                    +---------+---------------+---------+-----------+----------+--------------+  SFJ       Full                                                             +---------+---------------+---------+-----------+----------+--------------+  FV Prox   Full                                                             +---------+---------------+---------+-----------+----------+--------------+  FV Mid    Full            Yes       Yes                                    +---------+---------------+---------+-----------+----------+--------------+  FV Distal Full            Yes       Yes                                    +---------+---------------+---------+-----------+----------+--------------+  PFV       Full                                                             +---------+---------------+---------+-----------+----------+--------------+  POP       Full            Yes  Yes                                     +---------+---------------+---------+-----------+----------+--------------+  PTV       Full                                                             +---------+---------------+---------+-----------+----------+--------------+  PERO      Full                                                             +---------+---------------+---------+-----------+----------+--------------+   +---------+---------------+---------+-----------+----------+--------------+  LEFT      Compressibility Phasicity Spontaneity Properties Thrombus Aging  +---------+---------------+---------+-----------+----------+--------------+  CFV       Full            Yes       Yes                                    +---------+---------------+---------+-----------+----------+--------------+  SFJ       Full                                                             +---------+---------------+---------+-----------+----------+--------------+  FV Prox   Full                                                             +---------+---------------+---------+-----------+----------+--------------+  FV Mid    Full            Yes       Yes                                    +---------+---------------+---------+-----------+----------+--------------+  FV Distal Full            Yes       Yes                                    +---------+---------------+---------+-----------+----------+--------------+  PFV       Full                                                             +---------+---------------+---------+-----------+----------+--------------+  POP       Full  Yes       Yes                                    +---------+---------------+---------+-----------+----------+--------------+  PTV       Full                                                             +---------+---------------+---------+-----------+----------+--------------+  PERO      Full                                                              +---------+---------------+---------+-----------+----------+--------------+     Summary: BILATERAL: - No evidence of deep vein thrombosis seen in the lower extremities, bilaterally. -No evidence of popliteal cyst, bilaterally.   *See table(s) above for measurements and observations. Electronically signed by Jamelle Haring on 11/27/2021 at 10:39:47 AM.    Final       PFT Results Latest Ref Rng & Units 12/08/2021  FVC-Pre L 2.47  FVC-Predicted Pre % 70  FVC-Post L 2.47  FVC-Predicted Post % 70  Pre FEV1/FVC % % 67  Post FEV1/FCV % % 67  FEV1-Pre L 1.65  FEV1-Predicted Pre % 66  FEV1-Post L 1.66  DLCO uncorrected ml/min/mmHg 17.63  DLCO UNC% % 75  DLCO corrected ml/min/mmHg 19.83  DLCO COR %Predicted % 84  DLVA Predicted % 123  TLC L 5.82  TLC % Predicted % 82  RV % Predicted % 124    No results found for: NITRICOXIDE      Assessment & Plan:   No problem-specific Assessment & Plan notes found for this encounter.   I spent *** of dedicated to the care of this patient on the date of this encounter to include pre-visit review of records, face-to-face time with the patient discussing conditions above, post visit ordering of testing, clinical documentation with the electronic health record, making appropriate referrals as documented, and communicating necessary findings to members of the patients care team.  Clayton Bibles, NP 12/12/2021  Pt aware and understands NP's role.

## 2021-12-19 ENCOUNTER — Encounter: Payer: Self-pay | Admitting: Nurse Practitioner

## 2021-12-19 ENCOUNTER — Ambulatory Visit: Payer: Medicare Other | Admitting: Nurse Practitioner

## 2021-12-19 ENCOUNTER — Other Ambulatory Visit: Payer: Self-pay

## 2021-12-19 DIAGNOSIS — J4540 Moderate persistent asthma, uncomplicated: Secondary | ICD-10-CM | POA: Diagnosis not present

## 2021-12-19 DIAGNOSIS — J449 Chronic obstructive pulmonary disease, unspecified: Secondary | ICD-10-CM | POA: Diagnosis not present

## 2021-12-19 DIAGNOSIS — I10 Essential (primary) hypertension: Secondary | ICD-10-CM

## 2021-12-19 DIAGNOSIS — I2602 Saddle embolus of pulmonary artery with acute cor pulmonale: Secondary | ICD-10-CM | POA: Diagnosis not present

## 2021-12-19 NOTE — Assessment & Plan Note (Addendum)
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 Transitioned to maintenance therapy at 5 mg Twice daily. Discussed potential side effects and s/s of bleeding. No evidence of right heart strain on echo despite biomarkers. Awaiting hospital follow up with his cardiologist which is scheduled for 3/27. 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 No diffusion defect on PFTs. Advised exercise as tolerated.  ? ?Patient Instructions  ?-Continue Trelegy 1 puff daily, brush tongue and rinse mouth afterwards ?-Continue Albuterol inhaler 2 puffs every 6 hours as needed for shortness of breath or wheezing. Notify if symptoms persist despite rescue inhaler/neb use. ?-Continue Eliquis 5 mg (1 tab) Twice daily. Monitor for excessive bleeding/bruising and review information packet. ?-Continue protonix 40 mg daily  ?  ?Rinse mouth after inhaled corticosteroid use.  ?Avoid triggers, when able.  ?Exercise encouraged. Notify if worsening symptoms upon exertion.  ?Notify and seek help if symptoms unrelieved by rescue inhaler. ?  ?Follow up in 3 months with Dr. Silas Flood. If symptoms do not improve or worsen, please contact office for sooner follow up or seek emergency care. ? ? ?

## 2021-12-19 NOTE — Assessment & Plan Note (Signed)
Hx of asthma. No BD on recent PFTs. Does report good response to albuterol rescue inhaler. Will continue with triple therapy regimen.  ?

## 2021-12-19 NOTE — Assessment & Plan Note (Signed)
Well-controlled at visit. Follow up with cardiology as scheduled on 3/27.  ?

## 2021-12-19 NOTE — Patient Instructions (Addendum)
-  Continue Trelegy 1 puff daily, brush tongue and rinse mouth afterwards ?-Continue Albuterol inhaler 2 puffs every 6 hours as needed for shortness of breath or wheezing. Notify if symptoms persist despite rescue inhaler/neb use. ?-Continue Eliquis 5 mg (1 tab) Twice daily. Monitor for excessive bleeding/bruising and review information packet. ?-Continue protonix 40 mg daily  ?  ?Rinse mouth after inhaled corticosteroid use.  ?Avoid triggers, when able.  ?Exercise encouraged. Notify if worsening symptoms upon exertion.  ?Notify and seek help if symptoms unrelieved by rescue inhaler. ?  ?Follow up in 3 months with Dr. Silas Flood. If symptoms do not improve or worsen, please contact office for sooner follow up or seek emergency care. ?

## 2021-12-19 NOTE — Progress Notes (Signed)
$'[name]$  ID: Dan Freeman, male    DOB: 1931/06/19, 88 y.o.   MRN: [id number]  Chief Complaint  Patient presents with   Follow-up    He is still having some shortness of breath with exertion.     Referring provider: Maylon Peppers, MD  HPI: 88 year old male, never smoker followed for dyspnea upon exertion, history of asthma, and COPD. He is a patient of Dr. Kavin Leech and was last seen in office on 12/01/2021. Past medical history significant for hypertension, hypertrophic obstructive cardiomyopathy, right BBB, first-degree block, hypothyroidism.   TEST/EVENTS:  11/25/2021 CXR 2 view: hyperinflation. Mild cardiomegaly. There is pleuroparenchymal scarring left hemithorax. Calcified granuloma in the left apex.  11/25/2021 CTA chest: atherosclerosis. Saddle embolus straddling bifurcation of the pulmonary trunk extending into the central and lobar pulmonary artery branches b/l. 1 cm calcified LUL granuloma. Bibasilar linear atelectasis/scaring. Presence of right heart strain 11/26/2021 echocardiogram: EF 65-70%. Moderate LVH. G1DD. Mild MR. RV function and size normal. Both atriums were normal in size 12/08/2021 PFTs: FVC 2.47 (70), FEV1 1.66 (66), ratio 67, TLC 82%, DLCOcor 84%. No BD. Moderate obstructive disease with normal diffusion capacity   11/24/2021: OV with Dr. Silas Flood.  At previous visit was escalated to Trelegy in the setting of clinical diagnosis of asthma.  Evidence of hyperinflation on previous chest imaging to support this in the setting of never smoker.  Feels some improvement however still pretty dyspneic.  PFTs previously ordered however were never scheduled.  Scheduled at this visit.  Discussed changing inhaler from DPI to HFA in future if not improving.  Suspect dyspnea multifactorial given left atrial dilation and HOCM.   11/25/2021-11/28/2021: Hospital admission for saddle PE and related acute respiratory failure. IR advised anticoagulation over thrombectomy. Started on lovenox  and transitioned to Eliquis upon discharged. Right heart strain on echo. Hx of prostate cancer with neg PSA. Some testicular pain in Jan 2023 - [place] testes with moderate left hydrocele and plans to follow up with urology.   12/01/2021: OV with Emilya Justen NP for hospital follow up for unprovoked PE. Feeling better; occasional DOE. On started pack of Eliquis. Continued on Trelegy and PRN albuterol.   12/19/2021: Today - follow up Patient presents today for follow up. He had pulmonary function testing done at the end of last month which showed moderate obstructive defect with normal diffusion capacity without a bronchodilator response which we discussed today. He reported that he is still having some shortness of breath upon exertion, which is unchanged from his last visit. He does not have any issues with coughing or notice much wheezing. He denies orthopnea, PND, chest pain or lower extremity swelling. He continues on his Trelegy daily and uses his albuterol every other or once a day for shortness of breath. Since we saw each other last, he has started on his maintenance dosing of Eliquis 5 mg Twice daily and has not had any problems with excessive bleeding/bruising.  No Known Allergies  Immunization History  Administered Date(s) Administered   Influenza, High Dose Seasonal PF 10/14/2014, 07/29/2019   Influenza,inj,Quad PF,6+ Mos 08/14/2016, 09/07/2017, 07/08/2018   Influenza-Unspecified 09/30/2008, 10/25/2009, 07/26/2010, 08/15/2011, 08/28/2013, 07/25/2015, 08/14/2016, 08/21/2021   Pneumococcal Conjugate-13 08/14/2016   Pneumococcal Polysaccharide-23 08/02/2011    Past Medical History:  Diagnosis Date   Arthritis    Cancer ([facility])     prostrate     radiation   COPD (chronic obstructive pulmonary disease) ([facility])    Esophageal diverticulum    s/p esophageal resection  Essential hypertension    HOCM (hypertrophic obstructive cardiomyopathy) ([facility])    Not congenital, likely 2/2 uncontrolled HBP    Holosystolic murmur    4/78/29 echo shows LVOT stenosis consistent w/ HOCM and likely 2/2 uncontrolled HBP     Tobacco History: Social History   Tobacco Use  Smoking Status Never  Smokeless Tobacco Never   Counseling given: Not Answered   Outpatient Medications Prior to Visit  Medication Sig Dispense Refill   albuterol (PROAIR HFA) 108 (90 Base) MCG/ACT inhaler Inhale 2 puffs into the lungs every 6 (six) hours as needed for wheezing or shortness of breath. 1 each 11   albuterol (VENTOLIN HFA) 108 (90 Base) MCG/ACT inhaler Inhale 2 puffs into the lungs every 6 (six) hours as needed for shortness of breath. 1 each 11   apixaban (ELIQUIS) 5 MG TABS tablet Take 2 tablets (10 mg total) by mouth 2 (two) times daily for 6 days, then take 1 tablet (5 mg total) by mouth twice daily. 70 tablet 0   apixaban (ELIQUIS) 5 MG TABS tablet Take 1 tablet (5 mg total) by mouth 2 (two) times daily. 60 tablet 0   b complex vitamins tablet Take 1 tablet by mouth daily.     benzonatate (TESSALON) 200 MG capsule Take 200 mg by mouth 3 (three) times daily as needed for cough.     DM-Doxylamine-Acetaminophen (NYQUIL COLD & FLU PO) Take 1 Dose by mouth 2 (two) times daily as needed (cough, cold).     etodolac (LODINE) 400 MG tablet Take 400 mg by mouth daily.     finasteride (PROSCAR) 5 MG tablet Take 5 mg by mouth daily.     Fluticasone-Umeclidin-Vilant (TRELEGY ELLIPTA) 200-62.5-25 MCG/ACT AEPB Inhale 1 puff into the lungs daily. 60 each 3   gabapentin (NEURONTIN) 100 MG capsule Take 100 mg by mouth 3 (three) times daily.     Levothyroxine Sodium 100 MCG CAPS Take 100 mcg by mouth daily before breakfast.     Misc Natural Products (GINSENG COMPLEX PO) Take 1 capsule by mouth daily.      pantoprazole (PROTONIX) 40 MG tablet Take 1 tablet (40 mg total) by mouth daily. 30 tablet 0   tamsulosin (FLOMAX) 0.4 MG CAPS capsule Take 0.8 mg by mouth daily.     metoprolol succinate (TOPROL-XL) 25 MG 24 hr tablet Take 1  tablet (25 mg total) by mouth daily. Take with or immediately following a meal. (Patient not taking: Reported on 11/26/2021) 90 tablet 3   tiotropium (SPIRIVA HANDIHALER) 18 MCG inhalation capsule Place 1 capsule (18 mcg total) into inhaler and inhale daily. 30 capsule 2   No facility-administered medications prior to visit.     Review of Systems:   Constitutional: No weight loss or gain, night sweats, fevers, chills, fatigue, or lassitude. HEENT: No headaches, difficulty swallowing, tooth/dental problems, or sore throat. No sneezing, itching, ear ache, nasal congestion, or post nasal drip CV:  No chest pain, orthopnea, PND, swelling in lower extremities, anasarca, dizziness, palpitations, syncope Resp: +shortness of breath with exertion. No excess mucus or change in color of mucus. No productive or non-productive. No hemoptysis. No wheezing.  No chest wall deformity GI:  No heartburn, indigestion, abdominal pain, nausea, vomiting, diarrhea, change in bowel habits, loss of appetite, bloody stools.  GU: No dysuria, change in color of urine, urgency or frequency.  No flank pain, no hematuria  Skin: No rash, lesions, ulcerations MSK:  No joint pain or swelling.  No decreased range of  motion.  No back pain. Neuro: No dizziness or lightheadedness.  Psych: No depression or anxiety. Mood stable.     Physical Exam:  BP 118/76 (BP Location: Left Arm, Patient Position: Sitting, Cuff Size: Normal)    Pulse 63    Temp 97.9 F (36.6 C) (Oral)    Ht '5\' 10"'$  (1.778 m)    Wt 191 lb (86.6 kg)    SpO2 99%    BMI 27.41 kg/m   GEN: Pleasant, interactive, well-appearing; in no acute distress. HEENT:  Normocephalic and atraumatic. EACs patent bilaterally. TM pearly gray with present light reflex bilaterally. PERRLA. Sclera white. Nasal turbinates pink, moist and patent bilaterally. No rhinorrhea present. Oropharynx pink and moist, without exudate or edema. No lesions, ulcerations, or postnasal drip.  NECK:   Supple w/ fair ROM. No JVD present. Normal carotid impulses w/o bruits. Thyroid symmetrical with no goiter or nodules palpated. No lymphadenopathy.   CV: RRR, no m/r/g, no peripheral edema. Pulses intact, +2 bilaterally. No cyanosis, pallor or clubbing. PULMONARY:  Unlabored, regular breathing. Clear bilaterally A&P w/o wheezes/rales/rhonchi. No accessory muscle use. No dullness to percussion. GI: BS present and normoactive. Soft, non-tender to palpation. No organomegaly or masses detected. No CVA tenderness. MSK: No erythema, warmth or tenderness. Cap refil <2 sec all extrem. No deformities or joint swelling noted.  Neuro: A/Ox3. No focal deficits noted.   Skin: Warm, no lesions or rashe Psych: Normal affect and behavior. Judgement and thought content appropriate.     Lab Results:  CBC    Component Value Date/Time   WBC 4.9 11/28/2021 0052   RBC 3.98 (L) 11/28/2021 0052   HGB 11.2 (L) 11/28/2021 0052   HCT 33.7 (L) 11/28/2021 0052   PLT 180 11/28/2021 0052   MCV 84.7 11/28/2021 0052   MCH 28.1 11/28/2021 0052   MCHC 33.2 11/28/2021 0052   RDW 15.0 11/28/2021 0052   LYMPHSABS 1.9 11/28/2021 0052   MONOABS 0.5 11/28/2021 0052   EOSABS 0.2 11/28/2021 0052   BASOSABS 0.0 11/28/2021 0052    BMET    Component Value Date/Time   NA 136 11/28/2021 0052   K 4.4 11/28/2021 0052   CL 106 11/28/2021 0052   CO2 22 11/28/2021 0052   GLUCOSE 117 (H) 11/28/2021 0052   BUN 24 (H) 11/28/2021 0052   CREATININE 1.23 11/28/2021 0052   CALCIUM 8.8 (L) 11/28/2021 0052   GFRNONAA 56 (L) 11/28/2021 0052   GFRAA >60 06/29/2018 0422    BNP    Component Value Date/Time   BNP 109.1 (H) 11/28/2021 0052     Imaging:  DG Chest 2 View  Result Date: 11/25/2021 CLINICAL DATA:  Shortness of breath EXAM: CHEST - 2 VIEW COMPARISON:  06/27/2018 FINDINGS: Hyperinflation. Lateral view degraded by patient arm position. Midline trachea. Mild cardiomegaly. Atherosclerosis in the transverse aorta.  Pleuroparenchymal scarring at the inferior left hemithorax. Calcified granuloma in the left apex. EKG lead projects over the left upper lobe on the frontal radiograph. IMPRESSION: Hyperinflation, without acute disease. Aortic Atherosclerosis (ICD10-I70.0). Electronically Signed   By: Abigail Miyamoto M.D.   On: 11/25/2021 15:06   CT HEAD WO CONTRAST (5MM)  Result Date: 11/26/2021 CLINICAL DATA:  Syncope. EXAM: CT HEAD WITHOUT CONTRAST TECHNIQUE: Contiguous axial images were obtained from the base of the skull through the vertex without intravenous contrast. RADIATION DOSE REDUCTION: This exam was performed according to the departmental dose-optimization program which includes automated exposure control, adjustment of the mA and/or kV according to patient size and/or  use of iterative reconstruction technique. COMPARISON:  Head CT dated 05/22/2020. FINDINGS: Brain: Mild age-related atrophy and chronic microvascular ischemic changes. There is no acute intracranial hemorrhage. No mass effect or midline shift no extra-axial fluid collection. Vascular: No hyperdense vessel or unexpected calcification. Skull: Normal. Negative for fracture or focal lesion. Sinuses/Orbits: Mild mucoperiosteal thickening of paranasal sinuses. No air-fluid level. The mastoid air cells are clear. Other: None IMPRESSION: 1. No acute intracranial pathology. 2. Mild age-related atrophy and chronic microvascular ischemic changes. Electronically Signed   By: Anner Crete M.D.   On: 11/26/2021 02:59   CT Angio Chest PE W/Cm &/Or Wo Cm  Result Date: 11/25/2021 CLINICAL DATA:  Concern for pulmonary embolism. EXAM: CT ANGIOGRAPHY CHEST WITH CONTRAST TECHNIQUE: Multidetector CT imaging of the chest was performed using the standard protocol during bolus administration of intravenous contrast. Multiplanar CT image reconstructions and MIPs were obtained to evaluate the vascular anatomy. RADIATION DOSE REDUCTION: This exam was performed according to  the departmental dose-optimization program which includes automated exposure control, adjustment of the mA and/or kV according to patient size and/or use of iterative reconstruction technique. CONTRAST:  74m OMNIPAQUE IOHEXOL 350 MG/ML SOLN COMPARISON:  Chest CT dated 05/20/2018. FINDINGS: Cardiovascular: There is no cardiomegaly or pericardial effusion. There is mild atherosclerotic calcification of the thoracic aorta. No aneurysmal dilatation. There is a saddle embolus straddling the bifurcation of the pulmonary trunk with standing into the central, and lobar pulmonary artery branches bilaterally. The risks evidence of right heart straining with RV/LV ratio in the range of 1.0-1.25. Mediastinum/Nodes: No hilar or mediastinal adenopathy. The esophagus is grossly unremarkable no mediastinal fluid collection. Lungs/Pleura: A 1 cm calcified left upper lobe granuloma. Bibasilar linear atelectasis/scarring. No focal consolidation, pleural effusion, or pneumothorax. The central airways are patent. Upper Abdomen: No acute abnormality. Musculoskeletal: Old healed left posterior rib fractures. No acute osseous pathology. Prior left rib resection. Review of the MIP images confirms the above findings. IMPRESSION: Positive for acute PE with of right heart strain (RV/LV Ratio = 1.0-1.2) consistent with at least submassive (intermediate risk) PE. The presence of right heart strain has been associated with an increased risk of morbidity and mortality. Aortic Atherosclerosis (ICD10-I70.0). These results were called by telephone at the time of interpretation on 11/25/2021 at 7:20 pm to [facility], who verbally acknowledged these results. Electronically Signed   By: AAnner CreteM.D.   On: 11/25/2021 19:27   ECHOCARDIOGRAM COMPLETE  Result Date: 11/26/2021    ECHOCARDIOGRAM REPORT   Patient Name:   AMAURICE FOTHERINGHAMDate of Exam: 11/26/2021 Medical Rec #:  [id number]    Height:       70.0 in Accession #:     [id number]   Weight:       184.7 lb Date of Birth:  303-21-1932    BSA:          2.018 m Patient Age:    89years      BP:           118/78 mmHg Patient Gender: M             HR:           62 bpm. Exam Location:  Inpatient Procedure: 2D Echo STAT ECHO Indications:    pulmonary embolus  History:        Patient has prior history of Echocardiogram examinations, most  recent 06/28/2018. Hypertrophic Cardiomyopathy, COPD;                 Arrythmias:RBBB and first degree av block.  Sonographer:    Johny Chess RDCS Referring Phys: [place]  1. Left ventricular ejection fraction, by estimation, is 65 to 70%. The left ventricle has normal function. The left ventricle has no regional wall motion abnormalities. There is moderate concentric left ventricular hypertrophy. Left ventricular diastolic parameters are consistent with Grade I diastolic dysfunction (impaired relaxation).  2. There appears to be mild systolic anterior motion of the anterior mitral valve leaflet with trivial to mild MR. LVOT gradient 69mHg. This is improved from prior TTE in 2019 where there was severe SAM with a mean gradient of 247mg.  3. Right ventricular systolic function is normal. The right ventricular size is normal. Tricuspid regurgitation signal is inadequate for assessing PA pressure.  4. The mitral valve is normal in structure. Mild mitral valve regurgitation. There is mild SAM as detailed above  5. The aortic valve is tricuspid. There is mild thickening of the aortic valve. Aortic valve regurgitation is not visualized. Aortic valve sclerosis/calcification is present, without any evidence of aortic stenosis.  6. The inferior vena cava is normal in size with greater than 50% respiratory variability, suggesting right atrial pressure of 3 mmHg. Comparison(s): Compared to prior TTE in 2019, the SAM appears mild on current study with amean gradient of 1259m compared to 17m25mon last study. Otherwise,  there is no significant change. FINDINGS  Left Ventricle: Left ventricular ejection fraction, by estimation, is 65 to 70%. The left ventricle has normal function. The left ventricle has no regional wall motion abnormalities. The left ventricular internal cavity size was normal in size. There is  moderate concentric left ventricular hypertrophy. Left ventricular diastolic parameters are consistent with Grade I diastolic dysfunction (impaired relaxation). Right Ventricle: The right ventricular size is normal. Right vetricular wall thickness was not well visualized. Right ventricular systolic function is normal. Tricuspid regurgitation signal is inadequate for assessing PA pressure. Left Atrium: Left atrial size was normal in size. Right Atrium: Right atrial size was normal in size. Pericardium: There is no evidence of pericardial effusion. Mitral Valve: Mild SAM. The mitral valve is normal in structure. Mild mitral valve regurgitation. Tricuspid Valve: The tricuspid valve is normal in structure. Tricuspid valve regurgitation is trivial. Aortic Valve: The aortic valve is tricuspid. There is mild thickening of the aortic valve. Aortic valve regurgitation is not visualized. Aortic valve sclerosis/calcification is present, without any evidence of aortic stenosis. Pulmonic Valve: The pulmonic valve was normal in structure. Pulmonic valve regurgitation is trivial. Aorta: The aortic root and ascending aorta are structurally normal, with no evidence of dilitation. Venous: The inferior vena cava is normal in size with greater than 50% respiratory variability, suggesting right atrial pressure of 3 mmHg. IAS/Shunts: No atrial level shunt detected by color flow Doppler.  LEFT VENTRICLE PLAX 2D LVIDd:         4.70 cm   Diastology LVIDs:         3.10 cm   LV e' medial:    4.13 cm/s LV PW:         1.40 cm   LV E/e' medial:  12.3 LV IVS:        1.10 cm   LV e' lateral:   6.42 cm/s LVOT diam:     1.90 cm   LV E/e' lateral: 7.9 LV SV:  100 LV SV Index:   49 LVOT Area:     2.84 cm  RIGHT VENTRICLE             IVC RV S prime:     11.00 cm/s  IVC diam: 2.20 cm TAPSE (M-mode): 1.9 cm LEFT ATRIUM             Index        RIGHT ATRIUM           Index LA diam:        3.60 cm 1.78 cm/m   RA Area:     13.80 cm LA Vol (A2C):   34.2 ml 16.95 ml/m  RA Volume:   30.70 ml  15.21 ml/m LA Vol (A4C):   46.2 ml 22.89 ml/m LA Biplane Vol: 41.5 ml 20.56 ml/m  AORTIC VALVE LVOT Vmax:   143.00 cm/s LVOT Vmean:  94.500 cm/s LVOT VTI:    0.351 m  AORTA Ao Root diam: 3.10 cm Ao Asc diam:  3.00 cm MITRAL VALVE MV Area (PHT): 2.56 cm    SHUNTS MV Decel Time: 296 msec    Systemic VTI:  0.35 m MV E velocity: 51.00 cm/s  Systemic Diam: 1.90 cm MV A velocity: 93.40 cm/s MV E/A ratio:  0.55 Gwyndolyn Kaufman MD Electronically signed by Gwyndolyn Kaufman MD Signature Date/Time: 11/26/2021/9:44:43 AM    Final    US SCROTUM W/DOPPLER  Result Date: 11/25/2021 CLINICAL DATA:  Left scrotal swelling EXAM: SCROTAL ULTRASOUND DOPPLER ULTRASOUND OF THE TESTICLES TECHNIQUE: Complete ultrasound examination of the testicles, epididymis, and other scrotal structures was performed. Color and spectral Doppler ultrasound were also utilized to evaluate blood flow to the testicles. COMPARISON:  None. FINDINGS: Right testicle Measurements: 3.1 x 1.7 x 2.4 cm.  No mass.  Limited microlithiasis. Left testicle Measurements: 3.0 x 1.9 x 2.1 cm. No mass. Limited microlithiasis. Right epididymis:  Normal in size and appearance. Left epididymis:  Normal in size and appearance. Hydrocele:  Moderate left hydrocele. Varicocele:  None visualized. Pulsed Doppler interrogation of both testes demonstrates normal low resistance arterial and venous waveforms bilaterally. IMPRESSION: Moderate left hydrocele. Otherwise negative scrotal ultrasound. No evidence of testicular torsion. Electronically Signed   By: Julian Hy M.D.   On: 11/25/2021 22:20   VAS [place] LOWER EXTREMITY VENOUS  (DVT)  Result Date: 11/27/2021  Lower Venous DVT Study Patient Name:  COHAN STIPES  Date of Exam:   11/26/2021 Medical Rec #: [id number]      Accession #:    [id number] Date of Birth: [birth year]      Patient Gender: M Patient Age:   27 years Exam Location:  [facility] Procedure:      VAS [place] LOWER EXTREMITY VENOUS (DVT) Referring Phys: Andres Labrum --------------------------------------------------------------------------------  Indications: Pulmonary embolism.  Limitations: Diminutive femoral veins. Comparison Study: No prior study on file Performing Technologist: Sharion Dove RVS  Examination Guidelines: A complete evaluation includes B-mode imaging, spectral Doppler, color Doppler, and power Doppler as needed of all accessible portions of each vessel. Bilateral testing is considered an integral part of a complete examination. Limited examinations for reoccurring indications may be performed as noted. The reflux portion of the exam is performed with the patient in reverse Trendelenburg.  +---------+---------------+---------+-----------+----------+--------------+  RIGHT     Compressibility Phasicity Spontaneity Properties Thrombus Aging  +---------+---------------+---------+-----------+----------+--------------+  CFV       Full            Yes  Yes                                    +---------+---------------+---------+-----------+----------+--------------+  SFJ       Full                                                             +---------+---------------+---------+-----------+----------+--------------+  FV Prox   Full                                                             +---------+---------------+---------+-----------+----------+--------------+  FV Mid    Full            Yes       Yes                                    +---------+---------------+---------+-----------+----------+--------------+  FV Distal Full            Yes       Yes                                     +---------+---------------+---------+-----------+----------+--------------+  PFV       Full                                                             +---------+---------------+---------+-----------+----------+--------------+  POP       Full            Yes       Yes                                    +---------+---------------+---------+-----------+----------+--------------+  PTV       Full                                                             +---------+---------------+---------+-----------+----------+--------------+  PERO      Full                                                             +---------+---------------+---------+-----------+----------+--------------+   +---------+---------------+---------+-----------+----------+--------------+  LEFT      Compressibility Phasicity Spontaneity Properties Thrombus Aging  +---------+---------------+---------+-----------+----------+--------------+  CFV       Full  Yes       Yes                                    +---------+---------------+---------+-----------+----------+--------------+  SFJ       Full                                                             +---------+---------------+---------+-----------+----------+--------------+  FV Prox   Full                                                             +---------+---------------+---------+-----------+----------+--------------+  FV Mid    Full            Yes       Yes                                    +---------+---------------+---------+-----------+----------+--------------+  FV Distal Full            Yes       Yes                                    +---------+---------------+---------+-----------+----------+--------------+  PFV       Full                                                             +---------+---------------+---------+-----------+----------+--------------+  POP       Full            Yes       Yes                                     +---------+---------------+---------+-----------+----------+--------------+  PTV       Full                                                             +---------+---------------+---------+-----------+----------+--------------+  PERO      Full                                                             +---------+---------------+---------+-----------+----------+--------------+     Summary: BILATERAL: - No evidence of deep vein thrombosis seen in the lower extremities, bilaterally. -No evidence of popliteal cyst,  bilaterally.   *See table(s) above for measurements and observations. Electronically signed by Jamelle Haring on 11/27/2021 at 10:39:47 AM.    Final       PFT Results Latest Ref Rng & Units 12/08/2021  FVC-Pre L 2.47  FVC-Predicted Pre % 70  FVC-Post L 2.47  FVC-Predicted Post % 70  Pre FEV1/FVC % % 67  Post FEV1/FCV % % 67  FEV1-Pre L 1.65  FEV1-Predicted Pre % 66  FEV1-Post L 1.66  DLCO uncorrected ml/min/mmHg 17.63  DLCO UNC% % 75  DLCO corrected ml/min/mmHg 19.83  DLCO COR %Predicted % 84  DLVA Predicted % 123  TLC L 5.82  TLC % Predicted % 82  RV % Predicted % 124    No results found for: NITRICOXIDE      Assessment & Plan:   Pulmonary embolism (HCC) 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 Transitioned to maintenance therapy at 5 mg Twice daily. Discussed potential side effects and s/s of bleeding. No evidence of right heart strain on echo despite biomarkers. Awaiting hospital follow up with his cardiologist which is scheduled for 3/27. 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 No diffusion defect on PFTs. Advised exercise as tolerated.   Patient Instructions  -Continue Trelegy 1 puff daily, brush tongue and rinse mouth afterwards -Continue Albuterol inhaler 2 puffs every 6 hours as needed for shortness of  breath or wheezing. Notify if symptoms persist despite rescue inhaler/neb use. -Continue Eliquis 5 mg (1 tab) Twice daily. Monitor for excessive bleeding/bruising and review information packet. -Continue protonix 40 mg daily    Rinse mouth after inhaled corticosteroid use.  Avoid triggers, when able.  Exercise encouraged. Notify if worsening symptoms upon exertion.  Notify and seek help if symptoms unrelieved by rescue inhaler.   Follow up in 3 months with Dr. Silas Flood. If symptoms do not improve or worsen, please contact office for sooner follow up or seek emergency care.    Moderate COPD (chronic obstructive pulmonary disease) (HCC) PFTs completed with ratio 67 and FEV1 66 indicated moderate COPD. No BD on testing or diffusion defect. Currently on triple therapy regimen. Utilizing albuterol daily or every other day d/t SOB. Suspect primarily r/t sequala of PE. Walking oximetry today with SpO2 low 92% on room air. Could consider pulmonary rehab if DOE persists.   Asthma Hx of asthma. No BD on recent PFTs. Does report good response to albuterol rescue inhaler. Will continue with triple therapy regimen.   High blood pressure Well-controlled at visit. Follow up with cardiology as scheduled on 3/27.    I spent 35 minutes of dedicated to the care of this patient on the date of this encounter to include pre-visit review of records, face-to-face time with the patient discussing conditions above, post visit ordering of testing, clinical documentation with the electronic health record, making appropriate referrals as documented, and communicating necessary findings to members of the patients care team.  Clayton Bibles, NP 12/19/2021  Pt aware and understands NP's role.

## 2021-12-19 NOTE — Assessment & Plan Note (Addendum)
PFTs completed with ratio 67 and FEV1 66 indicated moderate COPD. No BD on testing or diffusion defect. Currently on triple therapy regimen. Utilizing albuterol daily or every other day d/t SOB. Suspect primarily r/t sequala of PE. Walking oximetry today with SpO2 low 92% on room air. Could consider pulmonary rehab if DOE persists.  ?

## 2021-12-27 ENCOUNTER — Ambulatory Visit (HOSPITAL_BASED_OUTPATIENT_CLINIC_OR_DEPARTMENT_OTHER): Payer: Medicare Other | Admitting: General Practice

## 2021-12-28 ENCOUNTER — Telehealth: Payer: Self-pay | Admitting: Pulmonary Disease

## 2021-12-28 NOTE — Telephone Encounter (Signed)
Called and left voicemail for patient to call office back  

## 2021-12-29 ENCOUNTER — Telehealth: Payer: Self-pay | Admitting: Pulmonary Disease

## 2021-12-29 ENCOUNTER — Encounter: Payer: Self-pay | Admitting: *Deleted

## 2021-12-29 NOTE — Telephone Encounter (Signed)
LMTCB for the pt and mailed letter asking to call back if still needing our assistance.  ?

## 2021-12-30 NOTE — Telephone Encounter (Signed)
Called and left voicemail for patient to call office back regarding his symptoms  ?

## 2021-12-30 NOTE — Telephone Encounter (Signed)
ATC patient x2, LMTCB per protocol will close this encounter. ?

## 2022-01-05 ENCOUNTER — Ambulatory Visit (HOSPITAL_BASED_OUTPATIENT_CLINIC_OR_DEPARTMENT_OTHER): Payer: Medicare Other | Admitting: Family

## 2022-01-12 ENCOUNTER — Ambulatory Visit (HOSPITAL_BASED_OUTPATIENT_CLINIC_OR_DEPARTMENT_OTHER): Payer: Medicare Other | Admitting: Family

## 2022-01-12 NOTE — Progress Notes (Deleted)
? ?  Office Visit  ?  ?Patient Name: Dan Freeman ?Date of Encounter: 01/12/2022 ? ?PCP:  Maylon Peppers, MD ?  ?[place]  ?Cardiologist:  Buford Dresser, MD  ?Advanced Practice Provider:  No care team member to display ?Electrophysiologist:  None  ? ?Chief Complaint  ?  ?Dan Freeman is a 88 y.o. male with a hx of HTN, COPD, prostate cancer s/p radiation, s/p esophagectomy 2/2 diverticulum, hypothyroidism, *** presents today for hospital follow up  ? ?Past Medical History  ?  ?Past Medical History:  ?Diagnosis Date  ? Arthritis   ? Cancer [facility])   ?  prostrate     radiation  ? COPD (chronic obstructive pulmonary disease) ([place])   ? Esophageal diverticulum   ? s/p esophageal resection  ? Essential hypertension   ? HOCM (hypertrophic obstructive cardiomyopathy) ([place])   ? Not congenital, likely 2/2 uncontrolled HBP  ? Holosystolic murmur   ? 06/28/18 echo shows LVOT stenosis consistent w/ HOCM and likely 2/2 uncontrolled HBP   ? ?Past Surgical History:  ?Procedure Laterality Date  ? ESOPHAGECTOMY    ? SHOULDER ARTHROSCOPY  01/05/2012  ? Procedure: ARTHROSCOPY SHOULDER;  Surgeon: Sharmon Revere, MD;  Location: [place];  Service: Orthopedics;  Laterality: Right;  acromioplasty  ? ? ?Allergies ? ?No Known Allergies ? ?History of Present Illness  ?  ?Dan Freeman is a 88 y.o. male with a hx of *** last seen ***. ? ? ?EKGs/Labs/Other Studies Reviewed:  ? ?The following studies were reviewed today: ?*** ? ?EKG:  EKG is *** ordered today.  The ekg ordered today demonstrates *** ? ?Recent Labs: ?11/25/2021: TSH 0.960 ?11/28/2021: ALT 13; B Natriuretic Peptide 109.1; BUN 24; Creatinine, Ser 1.23; Hemoglobin 11.2; Magnesium 2.0; Platelets 180; Potassium 4.4; Sodium 136  ?Recent Lipid Panel ?No results found for: CHOL, TRIG, HDL, CHOLHDL, VLDL, LDLCALC, LDLDIRECT ? ?Risk Assessment/Calculations:  ?{Does this patient have ATRIAL FIBRILLATION?[phone]} ? ?Home Medications  ? ?No outpatient  medications have been marked as taking for the 01/12/22 encounter (Appointment) with Loel Dubonnet, NP.  ?  ? ?Review of Systems  ? ***   ?All other systems reviewed and are otherwise negative except as noted above. ? ?Physical Exam  ?  ?VS:  There were no vitals taken for this visit. , BMI There is no height or weight on file to calculate BMI. ? ?Wt Readings from Last 3 Encounters:  ?12/19/21 191 lb (86.6 kg)  ?12/01/21 187 lb 12.8 oz (85.2 kg)  ?11/28/21 189 lb 6 oz (85.9 kg)  ?  ? ?GEN: Well nourished, well developed, in no acute distress. ?HEENT: normal. ?Neck: Supple, no JVD, carotid bruits, or masses. ?Cardiac: ***RRR, no murmurs, rubs, or gallops. No clubbing, cyanosis, edema.  ***Radials/PT 2+ and equal bilaterally.  ?Respiratory:  ***Respirations regular and unlabored, clear to auscultation bilaterally. ?GI: Soft, nontender, nondistended. ?MS: No deformity or atrophy. ?Skin: Warm and dry, no rash. ?Neuro:  Strength and sensation are intact. ?Psych: Normal affect. ? ?Assessment & Plan  ?  ?*** ? ?  ? ?Disposition: Follow up {follow up:15908} with Buford Dresser, MD or APP. ? ?Signed, ?Loel Dubonnet, NP ?01/12/2022, 8:02 AM ?[place] ?

## 2022-02-14 ENCOUNTER — Ambulatory Visit (HOSPITAL_BASED_OUTPATIENT_CLINIC_OR_DEPARTMENT_OTHER): Payer: Medicare Other | Admitting: Cardiology

## 2022-02-14 ENCOUNTER — Encounter (HOSPITAL_BASED_OUTPATIENT_CLINIC_OR_DEPARTMENT_OTHER): Payer: Self-pay | Admitting: Cardiology

## 2022-02-14 VITALS — BP 117/68 | HR 92 | Ht 70.0 in | Wt 195.2 lb

## 2022-02-14 DIAGNOSIS — I440 Atrioventricular block, first degree: Secondary | ICD-10-CM | POA: Diagnosis not present

## 2022-02-14 DIAGNOSIS — I452 Bifascicular block: Secondary | ICD-10-CM | POA: Diagnosis not present

## 2022-02-14 DIAGNOSIS — Z86711 Personal history of pulmonary embolism: Secondary | ICD-10-CM | POA: Diagnosis not present

## 2022-02-14 DIAGNOSIS — I421 Obstructive hypertrophic cardiomyopathy: Secondary | ICD-10-CM | POA: Diagnosis not present

## 2022-02-14 DIAGNOSIS — R0602 Shortness of breath: Secondary | ICD-10-CM

## 2022-02-14 NOTE — Patient Instructions (Signed)
Medication Instructions:  ?Your physician recommends that you continue on your current medications as directed. Please refer to the Current Medication list given to you today.  ? ?Labwork: ?NONE ? ?Testing/Procedures: ?NONE ? ?Follow-Up: ?08/18/2022 8:00 AM  ? ?  ? ?

## 2022-02-14 NOTE — Progress Notes (Signed)
?Cardiology Office Note:   ? ?Date:  02/14/2022  ? ?ID:  Dan Freeman, DOB December 27, 1930, MRN [id number] ? ?PCP:  Vonna Drafts, FNP  ?Cardiologist:  Buford Dresser, MD PhD ? ?Referring MD: No ref. provider found  ? ?CC: follow up ? ?History of Present Illness:   ? ?Dan Freeman is a 88 y.o. male with a hx of hypertension, COPD, prostate cancer s/p radiation, s/p esophagectomy 2/2 diverticulum, hypothyroidism who is seen for follow up today. I initially met him during his hospitalization 9/12-9/16/19 for shortness of breath (in the context of recent pneumonia, on inhalers/antibiotics/steroids) and mildly elevated/flat troponin. He did have near syncope with a coughing spell, thought to be 2/2 dynamic outflow obstruction from HOCM.  ? ?Today: ?Reviewed admission from 11/2021, found to have saddle pulmonary embolism with right heart strain on CT. Breathing slowing improving but not at baseline. Tolerating apixaban well, no significant bleeding issues. Reviewed CT and echo from hospitalization.  ? 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 Also does a lot of mechanical work, hard for him to stay still. Notes that bending/squatting does make him short of breath, discussed how this affects HOCM. ? ?Wife is very good about making sure he stays hydrated.  ? ?Denies chest pain. No PND, orthopnea, LE edema or unexpected weight gain. No syncope or palpitations.  ? ? ?Past Medical History:  ?Diagnosis Date  ? Arthritis   ? Cancer [facility])   ?  prostrate     radiation  ? COPD (chronic obstructive pulmonary disease) ([place])   ? Esophageal diverticulum   ? s/p esophageal resection  ? Essential hypertension   ? HOCM (hypertrophic obstructive cardiomyopathy) ([place])   ? Not congenital, likely 2/2 uncontrolled HBP  ? Holosystolic murmur   ? 06/28/18 echo shows LVOT stenosis consistent w/ HOCM and likely 2/2 uncontrolled HBP   ? ? ?Past Surgical History:  ?Procedure  Laterality Date  ? ESOPHAGECTOMY    ? SHOULDER ARTHROSCOPY  01/05/2012  ? Procedure: ARTHROSCOPY SHOULDER;  Surgeon: Sharmon Revere, MD;  Location: [place];  Service: Orthopedics;  Laterality: Right;  acromioplasty  ? ? ?Current Medications: ?Current Outpatient Medications on File Prior to Visit  ?Medication Sig  ? albuterol (PROAIR HFA) 108 (90 Base) MCG/ACT inhaler Inhale 2 puffs into the lungs every 6 (six) hours as needed for wheezing or shortness of breath.  ? albuterol (VENTOLIN HFA) 108 (90 Base) MCG/ACT inhaler Inhale 2 puffs into the lungs every 6 (six) hours as needed for shortness of breath.  ? apixaban (ELIQUIS) 5 MG TABS tablet Take 1 tablet (5 mg total) by mouth 2 (two) times daily.  ? b complex vitamins tablet Take 1 tablet by mouth daily.  ? benzonatate (TESSALON) 200 MG capsule Take 200 mg by mouth 3 (three) times daily as needed for cough.  ? DM-Doxylamine-Acetaminophen (NYQUIL COLD & FLU PO) Take 1 Dose by mouth 2 (two) times daily as needed (cough, cold).  ? etodolac (LODINE) 400 MG tablet Take 400 mg by mouth daily.  ? finasteride (PROSCAR) 5 MG tablet Take 5 mg by mouth daily.  ? Fluticasone-Umeclidin-Vilant (TRELEGY ELLIPTA) 200-62.5-25 MCG/ACT AEPB Inhale 1 puff into the lungs daily.  ? gabapentin (NEURONTIN) 100 MG capsule Take 100 mg by mouth 3 (three) times daily.  ? Levothyroxine Sodium 100 MCG CAPS Take 100 mcg by mouth daily before breakfast.  ? metoprolol succinate (TOPROL-XL) 25 MG 24 hr  tablet Take 1 tablet (25 mg total) by mouth daily. Take with or immediately following a meal.  ? Misc Natural Products (GINSENG COMPLEX PO) Take 1 capsule by mouth daily.   ? pantoprazole (PROTONIX) 40 MG tablet Take 1 tablet (40 mg total) by mouth daily.  ? tamsulosin (FLOMAX) 0.4 MG CAPS capsule Take 0.8 mg by mouth daily.  ? ?No current facility-administered medications on file prior to visit.  ?  ? ?Allergies:   Patient has no known allergies.  ? ?Social History  ? ?Tobacco Use  ? Smoking status:  Never  ? Smokeless tobacco: Never  ?Vaping Use  ? Vaping Use: Never used  ?Substance Use Topics  ? Alcohol use: No  ? Drug use: No  ? ? ?Family History: ?The patient's family history includes Cancer in his brother; Diabetes Mellitus II in his sister. ? ?ROS:   ?Please see the history of present illness.  Additional pertinent ROS otherwise unremarkable. ? ? ?EKGs/Labs/Other Studies Reviewed:   ? ?The following studies were reviewed today: ?Echo 11/27/21 ?1. Left ventricular ejection fraction, by estimation, is 65 to 70%. The  ?left ventricle has normal function. The left ventricle has no regional  ?wall motion abnormalities. There is moderate concentric left ventricular  ?hypertrophy. Left ventricular  ?diastolic parameters are consistent with Grade I diastolic dysfunction  ?(impaired relaxation).  ? 2. There appears to be mild systolic anterior motion of the anterior  ?mitral valve leaflet with trivial to mild MR. LVOT gradient 91mHg. This  ?is improved from prior TTE in 2019 where there was severe SAM with a mean  ?gradient of 217mg.  ? 3. Right ventricular systolic function is normal. The right ventricular  ?size is normal. Tricuspid regurgitation signal is inadequate for assessing  ?PA pressure.  ? 4. The mitral valve is normal in structure. Mild mitral valve  ?regurgitation. There is mild SAM as detailed above  ? 5. The aortic valve is tricuspid. There is mild thickening of the aortic  ?valve. Aortic valve regurgitation is not visualized. Aortic valve  ?sclerosis/calcification is present, without any evidence of aortic  ?stenosis.  ? 6. The inferior vena cava is normal in size with greater than 50%  ?respiratory variability, suggesting right atrial pressure of 3 mmHg.  ? ?Comparison(s): Compared to prior TTE in 2019, the SAM appears mild on  ?current study with amean gradient of 1271m compared to 17m68mon last  ?study. Otherwise, there is no significant change.  ? ?06/28/18 ?TTE ?Study Conclusions ?- Left  ventricle: The cavity size was normal. There was moderate ?  concentric hypertrophy. Systolic function was hyperdynamic. The ?  estimated ejection fraction was in the range of 75% to 80%. Wall ?  motion was normal; there were no regional wall motion ?  abnormalities. ?- Mitral valve: There was probable severe systolic anterior motion ?  of the anterior leaflet. There was mild regurgitation. ?- Left atrium: The atrium was mildly dilated. 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  mostly a subvalvular problem. Images are technically difficult ?  and cannot entirely exclude a component of valvular aortic ?  stenosis. ?Recommendations: ?1. Consider transesophageal echocardiography if clinically ?   indicated to clarify mechanism of LV outflow tract stenosis. ?2. Possible hypertrophic obstructive cardiomyopathy. ? ?EKG:  The ekg is personally reviewed today. 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 ?Recent Labs: ?11/25/2021: TSH 0.960 ?11/28/2021: ALT 13; B Natriuretic Peptide 109.1; BUN 24;  Creatinine, Ser 1.23; Hemoglobin 11.2; Magnesium 2.0; Platelets 180; Potassium 4.4; Sodium 136  ?Recent Lipid Panel ?No results found for: CHOL, TRIG, HDL, CHOLHDL, VLDL, LDLCALC, LDLDIRECT ? ?Physical Exam:   ? ?VS:  BP 117/68 (BP Location: Left Arm, Patient Position: Sitting, Cuff Size: Normal)   Pulse 92   Ht 5' 10"  (1.778 m)   Wt 195 lb 3.2 oz (88.5 kg)   BMI 28.01 kg/m?    ? ?Wt Readings from Last 3 Encounters:  ?02/14/22 195 lb 3.2 oz (88.5 kg)  ?12/19/21 191 lb (86.6 kg)  ?12/01/21 187 lb 12.8 oz (85.2 kg)  ?  ?GEN: Well nourished, well developed in no acute distress ?HEENT: Normal, moist mucous membranes ?NECK: No JVD ?CARDIAC: regular rhythm, normal S1 and S2, no rubs or gallops. 1/6 systolic murmur. ?VASCULAR: Radial and DP pulses 2+ bilaterally. No carotid bruits ?RESPIRATORY:  Clear to auscultation without rales, wheezing or rhonchi  ?ABDOMEN: Soft, non-tender,  non-distended ?MUSCULOSKELETAL:  Ambulates independently ?SKIN: Warm and dry, no edema ?NEUROLOGIC:  Alert and oriented x 3. No focal neuro deficits noted. ?PSYCHIATRIC:  Normal affect   ? ?ASSESSMENT:   ? ?1. HOCM

## 2022-04-27 ENCOUNTER — Ambulatory Visit: Payer: Medicare Other | Admitting: Pulmonary Disease

## 2022-04-27 ENCOUNTER — Encounter: Payer: Self-pay | Admitting: Pulmonary Disease

## 2022-04-27 VITALS — BP 130/74 | HR 73 | Temp 98.4°F | Ht 70.0 in | Wt 188.0 lb

## 2022-04-27 DIAGNOSIS — J4540 Moderate persistent asthma, uncomplicated: Secondary | ICD-10-CM | POA: Diagnosis not present

## 2022-04-27 DIAGNOSIS — I2602 Saddle embolus of pulmonary artery with acute cor pulmonale: Secondary | ICD-10-CM | POA: Diagnosis not present

## 2022-04-27 MED ORDER — PREDNISONE 20 MG PO TABS: 20.0000 mg | ORAL_TABLET | Freq: Every day | ORAL | 0 refills | Status: AC

## 2022-04-27 MED ORDER — TRELEGY ELLIPTA 200-62.5-25 MCG/ACT IN AEPB: 1.0000 | INHALATION_SPRAY | Freq: Every day | RESPIRATORY_TRACT | 3 refills | Status: DC

## 2022-04-27 NOTE — Patient Instructions (Signed)
Nice to see you again  Continue the apixaban or the blood thinner for the blood clot  A refill for the Trelegy, if this is too expensive please send me a message and we will look for a solution  I do not have any samples of Trelegy today  I will call you in prednisone 20 mg daily, 1 pill once a day for the next 5 days.  See if this helps clear up some of the congestion you feel in your chest.  Return to clinic in 3 months or sooner if needed with Dr. Silas Flood.

## 2022-05-06 NOTE — Progress Notes (Signed)
$'@Patient'r$  ID: Dan Freeman, Dan Freeman    DOB: 02/26/1931, 88 y.o.   MRN: [id number]  Chief Complaint  Patient presents with   Follow-up    Pt is here for follow up for saddle PE. Pt states that he feels like he is more short of breath.     Referring provider: Vonna Drafts, FNP  HPI:   88 y.o. man whom we are seeing in follow-up of dyspnea on exertion and reported COPD although no PFTs available to review.  Most recent pulmonary note 12/2021 Roxan Diesel, NP reviewed.  Discharge summary 11/2021 with acute PE reviewed.  Previously, had been escalating inhaler therapy for presumed asthma.  Other triple inhaled therapy with Trelegy.  Helped some.  Continue to be dyspneic.  Went to the ED.  Found to have submassive PE.  Fortunately, RV function and size preserved on TTE at the time.  Continues on apixaban.  Continues on Trelegy.  Does feel it helps some.  His chief complaint is congestion.  He feels like it is in his chest.  Some cough.  HPI at initial visit: Reports history of asthma in past. Diagnosed many years ago. By and large no real symptoms.  However, he endorses worsening dyspnea over the last month.  Associated cough.  Will be worse in the evenings.  No time of day when things are better or worse.  No environmental or seasonal factors he can in the panic is better or worse.  No clear trigger, or viral illness.  He states his breathing has been well maintained on Breo.  But again symptoms worsen last month despite good adherence to this.  He does use albuterol and this helps his symptoms some.  No other alleviating or exacerbating factors.  He reports recent chest x-ray via PCP as I cannot view or see result.  He thinks this was normal.  Most recent xray 2019 clear with mild hyperinflation on lateral view on my review and interpretation. TTE 2019 reviewed with HOCM, LVOT obstruction, dilated LA.  PMH: asthma, HOCM, hypothyroid Surgical history:esophagectomy, shoulder surgery Family  history: DM in sister, brother with cancer Social history: Never smoker, lives in [place] [employer] / Pulmonary Flowsheets:   ACT:      No data to display          MMRC:     No data to display          Epworth:      No data to display          Tests:   FENO:  No results found for: "NITRICOXIDE"  PFT:    Latest Ref Rng & Units 12/08/2021    3:53 PM  PFT Results  FVC-Pre L 2.47   FVC-Predicted Pre % 70   FVC-Post L 2.47   FVC-Predicted Post % 70   Pre FEV1/FVC % % 67   Post FEV1/FCV % % 67   FEV1-Pre L 1.65   FEV1-Predicted Pre % 66   FEV1-Post L 1.66   DLCO uncorrected ml/min/mmHg 17.63   DLCO UNC% % 75   DLCO corrected ml/min/mmHg 19.83   DLCO COR %Predicted % 84   DLVA Predicted % 123   TLC L 5.82   TLC % Predicted % 82   RV % Predicted % 124   Personally reviewed interpreted spirometry suggestive of mild restriction versus air trapping.  No bronchodilator spots.  Lung volumes confirm air trapping with elevated RV/TLC ratio.  DLCO within normal limits.  WALK:     12/19/2021   10:46 AM  SIX MIN WALK  Supplimental Oxygen during Test? (L/min) No  Tech Comments: patient walked x 1 lap and he reports that he did not feel short of breath.   Imaging: Personally reviewed and as per EMR  Lab Results: Personally reviewed CBC    Component Value Date/Time   WBC 4.9 11/28/2021 0052   RBC 3.98 (L) 11/28/2021 0052   HGB 11.2 (L) 11/28/2021 0052   HCT 33.7 (L) 11/28/2021 0052   PLT 180 11/28/2021 0052   MCV 84.7 11/28/2021 0052   MCH 28.1 11/28/2021 0052   MCHC 33.2 11/28/2021 0052   RDW 15.0 11/28/2021 0052   LYMPHSABS 1.9 11/28/2021 0052   MONOABS 0.5 11/28/2021 0052   EOSABS 0.2 11/28/2021 0052   BASOSABS 0.0 11/28/2021 0052    BMET    Component Value Date/Time   NA 136 11/28/2021 0052   K 4.4 11/28/2021 0052   CL 106 11/28/2021 0052   CO2 22 11/28/2021 0052   GLUCOSE 117 (H) 11/28/2021 0052   BUN 24 (H) 11/28/2021 0052    CREATININE 1.23 11/28/2021 0052   CALCIUM 8.8 (L) 11/28/2021 0052   GFRNONAA 56 (L) 11/28/2021 0052   GFRAA >60 06/29/2018 0422    BNP    Component Value Date/Time   BNP 109.1 (H) 11/28/2021 0052    ProBNP No results found for: "PROBNP"  Specialty Problems       Pulmonary Problems   COPD with acute exacerbation (HCC)   Moderate COPD (chronic obstructive pulmonary disease) ([place])   Pneumonia    Currently on abx      Asthma    No Known Allergies  Immunization History  Administered Date(s) Administered   Influenza, High Dose Seasonal PF 10/14/2014, 07/29/2019   Influenza,inj,Quad PF,6+ Mos 08/14/2016, 09/07/2017, 07/08/2018   Influenza-Unspecified 09/30/2008, 10/25/2009, 07/26/2010, 08/15/2011, 08/28/2013, 07/25/2015, 08/14/2016, 08/21/2021   Pneumococcal Conjugate-13 08/14/2016   Pneumococcal Polysaccharide-23 08/02/2011    Past Medical History:  Diagnosis Date   Arthritis    Cancer ([place])     prostrate     radiation   COPD (chronic obstructive pulmonary disease) ([place])    Esophageal diverticulum    s/p esophageal resection   Essential hypertension    HOCM (hypertrophic obstructive cardiomyopathy) ([place])    Not congenital, likely 2/2 uncontrolled HBP   Holosystolic murmur    9/62/22 echo shows LVOT stenosis consistent w/ HOCM and likely 2/2 uncontrolled HBP     Tobacco History: Social History   Tobacco Use  Smoking Status Never  Smokeless Tobacco Never   Counseling given: Not Answered   Continue to not smoke  Outpatient Encounter Medications as of 04/27/2022  Medication Sig   albuterol (PROAIR HFA) 108 (90 Base) MCG/ACT inhaler Inhale 2 puffs into the lungs every 6 (six) hours as needed for wheezing or shortness of breath.   albuterol (VENTOLIN HFA) 108 (90 Base) MCG/ACT inhaler Inhale 2 puffs into the lungs every 6 (six) hours as needed for shortness of breath.   apixaban (ELIQUIS) 5 MG TABS tablet Take 1 tablet (5 mg total) by mouth 2 (two) times  daily.   b complex vitamins tablet Take 1 tablet by mouth daily.   benzonatate (TESSALON) 200 MG capsule Take 200 mg by mouth 3 (three) times daily as needed for cough.   DM-Doxylamine-Acetaminophen (NYQUIL COLD & FLU PO) Take 1 Dose by mouth 2 (two) times daily as needed (cough, cold).   etodolac (LODINE) 400 MG tablet  Take 400 mg by mouth daily.   finasteride (PROSCAR) 5 MG tablet Take 5 mg by mouth daily.   gabapentin (NEURONTIN) 100 MG capsule Take 100 mg by mouth 3 (three) times daily.   Levothyroxine Sodium 100 MCG CAPS Take 100 mcg by mouth daily before breakfast.   Misc Natural Products (GINSENG COMPLEX PO) Take 1 capsule by mouth daily.    pantoprazole (PROTONIX) 40 MG tablet Take 1 tablet (40 mg total) by mouth daily.   [EXPIRED] predniSONE (DELTASONE) 20 MG tablet Take 1 tablet (20 mg total) by mouth daily with breakfast for 5 days.   tamsulosin (FLOMAX) 0.4 MG CAPS capsule Take 0.8 mg by mouth daily.   [DISCONTINUED] Fluticasone-Umeclidin-Vilant (TRELEGY ELLIPTA) 200-62.5-25 MCG/ACT AEPB Inhale 1 puff into the lungs daily.   Fluticasone-Umeclidin-Vilant (TRELEGY ELLIPTA) 200-62.5-25 MCG/ACT AEPB Inhale 1 puff into the lungs daily.   metoprolol succinate (TOPROL-XL) 25 MG 24 hr tablet Take 1 tablet (25 mg total) by mouth daily. Take with or immediately following a meal.   No facility-administered encounter medications on file as of 04/27/2022.     Review of Systems  Review of Systems  N/a  BP 130/74 (BP Location: Left Arm, Patient Position: Sitting, Cuff Size: Normal)   Pulse 73   Temp 98.4 F (36.9 C) (Oral)   Ht '5\' 10"'$  (1.778 m)   Wt 188 lb (85.3 kg)   SpO2 91%   BMI 26.98 kg/m   Wt Readings from Last 5 Encounters:  04/27/22 188 lb (85.3 kg)  02/14/22 195 lb 3.2 oz (88.5 kg)  12/19/21 191 lb (86.6 kg)  12/01/21 187 lb 12.8 oz (85.2 kg)  11/28/21 189 lb 6 oz (85.9 kg)    BMI Readings from Last 5 Encounters:  04/27/22 26.98 kg/m  02/14/22 28.01 kg/m  12/19/21  27.41 kg/m  12/01/21 26.95 kg/m  11/28/21 27.17 kg/m     Physical Exam General: Well-appearing, no acute distress Eyes: EOMI, no icterus Neck: Supple, no JVP Pulmonary: Clear, normal work of breathing Cardiovascular: Regular rate and rhythm, no murmur MSK: No synovitis, joint effusion Neuro: Normal gait, no weakness Psych: Normal mood, full affect   Assessment & Plan:   Dyspnea on exertion: High suspicion for reactive airways disease given air trapping on PFTs.  Acute PE 11/2021 contributing.  Improving with treatment of PE.  Trelegy helped some.  To continue for now.  Asthma: Hyperinflation on chest imaging with air trapping on PFTs.  With worsening symptoms on Breo.  Escalated to Trelegy given uncontrolled symptoms on ICS/LABA.  Somewhat helpful.  Given ongoing chest congestion, trial prednisone 10 mg daily x5 days.  Has been beneficial in clearing this up in the past.  Pulmonary embolus: 11/2021.  No clear inciting event, unprovoked.  Continue anticoagulation.   Return in about 3 months (around 07/28/2022).   Lanier Clam, MD 05/06/2022

## 2022-07-28 ENCOUNTER — Encounter: Payer: Self-pay | Admitting: Pulmonary Disease

## 2022-07-28 ENCOUNTER — Ambulatory Visit (INDEPENDENT_AMBULATORY_CARE_PROVIDER_SITE_OTHER): Payer: Medicare Other | Admitting: Pulmonary Disease

## 2022-07-28 VITALS — BP 126/68 | HR 64 | Temp 98.6°F | Wt 192.0 lb

## 2022-07-28 DIAGNOSIS — I2602 Saddle embolus of pulmonary artery with acute cor pulmonale: Secondary | ICD-10-CM | POA: Diagnosis not present

## 2022-07-28 DIAGNOSIS — J4540 Moderate persistent asthma, uncomplicated: Secondary | ICD-10-CM | POA: Diagnosis not present

## 2022-07-28 MED ORDER — TRELEGY ELLIPTA 200-62.5-25 MCG/ACT IN AEPB: 1.0000 | INHALATION_SPRAY | Freq: Every day | RESPIRATORY_TRACT | 0 refills | Status: DC

## 2022-07-28 NOTE — Progress Notes (Signed)
$'@Patient'e$  ID: Dan Freeman, male    DOB: 1930/10/27, 88 y.o.   MRN: [id number]  Chief Complaint  Patient presents with   Follow-up    Follow up for asthma. Pt states he ran out of his trelegy about a week ago. But he does still have his albuterol. Pt states he has been having a wheeze for a few days now. Pt states he has been without Eliquis for over a week. He talked to primary care but he states the cost is too much for the medication.     Referring provider: Vonna Drafts, FNP  HPI:   88 y.o. man whom we are seeing in follow-up of dyspnea on exertion with PFTs demonstrating significant bronchodilator response.   Patient returns for routine follow-up.  Trelegy has been helpful.  Unfortunate he ran out about 1 week ago.  Same with the Eliquis.  Price at the pharmacy went way up.  Likely in the donut hole.  Been off both medicines for about 7 days.  Has increased congestion and low bit of wheeze, mild dyspnea exertion since off the Trelegy.  HPI at initial visit: Reports history of asthma in past. Diagnosed many years ago. By and large no real symptoms.  However, he endorses worsening dyspnea over the last month.  Associated cough.  Will be worse in the evenings.  No time of day when things are better or worse.  No environmental or seasonal factors he can in the panic is better or worse.  No clear trigger, or viral illness.  He states his breathing has been well maintained on Breo.  But again symptoms worsen last month despite good adherence to this.  He does use albuterol and this helps his symptoms some.  No other alleviating or exacerbating factors.  He reports recent chest x-ray via PCP as I cannot view or see result.  He thinks this was normal.  Most recent xray 2019 clear with mild hyperinflation on lateral view on my review and interpretation. TTE 2019 reviewed with HOCM, LVOT obstruction, dilated LA.  PMH: asthma, HOCM, hypothyroid Surgical history:esophagectomy, shoulder  surgery Family history: DM in sister, brother with cancer Social history: Never smoker, lives in [place]   Questionaires / Pulmonary Flowsheets:   ACT:  Asthma Control Test ACT Total Score  07/28/2022  9:28 AM 15    MMRC:     No data to display          Epworth:      No data to display          Tests:   FENO:  No results found for: "NITRICOXIDE"  PFT:    Latest Ref Rng & Units 12/08/2021    3:53 PM  PFT Results  FVC-Pre L 2.47   FVC-Predicted Pre % 70   FVC-Post L 2.47   FVC-Predicted Post % 70   Pre FEV1/FVC % % 67   Post FEV1/FCV % % 67   FEV1-Pre L 1.65   FEV1-Predicted Pre % 66   FEV1-Post L 1.66   DLCO uncorrected ml/min/mmHg 17.63   DLCO UNC% % 75   DLCO corrected ml/min/mmHg 19.83   DLCO COR %Predicted % 84   DLVA Predicted % 123   TLC L 5.82   TLC % Predicted % 82   RV % Predicted % 124   Personally reviewed interpreted spirometry suggestive of mild restriction versus air trapping.  No bronchodilator spots.  Lung volumes confirm air trapping with elevated RV/TLC ratio.  DLCO  within normal limits.  WALK:     12/19/2021   10:46 AM  SIX MIN WALK  Supplimental Oxygen during Test? (L/min) No  Tech Comments: patient walked x 1 lap and he reports that he did not feel short of breath.   Imaging: Personally reviewed and as per EMR  Lab Results: Personally reviewed CBC    Component Value Date/Time   WBC 4.9 11/28/2021 0052   RBC 3.98 (L) 11/28/2021 0052   HGB 11.2 (L) 11/28/2021 0052   HCT 33.7 (L) 11/28/2021 0052   PLT 180 11/28/2021 0052   MCV 84.7 11/28/2021 0052   MCH 28.1 11/28/2021 0052   MCHC 33.2 11/28/2021 0052   RDW 15.0 11/28/2021 0052   LYMPHSABS 1.9 11/28/2021 0052   MONOABS 0.5 11/28/2021 0052   EOSABS 0.2 11/28/2021 0052   BASOSABS 0.0 11/28/2021 0052    BMET    Component Value Date/Time   NA 136 11/28/2021 0052   K 4.4 11/28/2021 0052   CL 106 11/28/2021 0052   CO2 22 11/28/2021 0052   GLUCOSE 117 (H)  11/28/2021 0052   BUN 24 (H) 11/28/2021 0052   CREATININE 1.23 11/28/2021 0052   CALCIUM 8.8 (L) 11/28/2021 0052   GFRNONAA 56 (L) 11/28/2021 0052   GFRAA >60 06/29/2018 0422    BNP    Component Value Date/Time   BNP 109.1 (H) 11/28/2021 0052    ProBNP No results found for: "PROBNP"  Specialty Problems       Pulmonary Problems   COPD with acute exacerbation (HCC)   Moderate COPD (chronic obstructive pulmonary disease) ([place])   Pneumonia    Currently on abx      Asthma    No Known Allergies  Immunization History  Administered Date(s) Administered   Influenza, High Dose Seasonal PF 10/14/2014, 07/29/2019   Influenza,inj,Quad PF,6+ Mos 08/14/2016, 09/07/2017, 07/08/2018   Influenza-Unspecified 09/30/2008, 10/25/2009, 07/26/2010, 08/15/2011, 08/28/2013, 07/25/2015, 08/14/2016, 08/21/2021   Pneumococcal Conjugate-13 08/14/2016   Pneumococcal Polysaccharide-23 08/02/2011    Past Medical History:  Diagnosis Date   Arthritis    Cancer ([place])     prostrate     radiation   COPD (chronic obstructive pulmonary disease) ([place])    Esophageal diverticulum    s/p esophageal resection   Essential hypertension    HOCM (hypertrophic obstructive cardiomyopathy) ([place])    Not congenital, likely 2/2 uncontrolled HBP   Holosystolic murmur    7/98/92 echo shows LVOT stenosis consistent w/ HOCM and likely 2/2 uncontrolled HBP     Tobacco History: Social History   Tobacco Use  Smoking Status Never  Smokeless Tobacco Never   Counseling given: Not Answered   Continue to not smoke  Outpatient Encounter Medications as of 07/28/2022  Medication Sig   albuterol (PROAIR HFA) 108 (90 Base) MCG/ACT inhaler Inhale 2 puffs into the lungs every 6 (six) hours as needed for wheezing or shortness of breath.   albuterol (VENTOLIN HFA) 108 (90 Base) MCG/ACT inhaler Inhale 2 puffs into the lungs every 6 (six) hours as needed for shortness of breath.   apixaban (ELIQUIS) 5 MG TABS tablet Take  1 tablet (5 mg total) by mouth 2 (two) times daily.   b complex vitamins tablet Take 1 tablet by mouth daily.   benzonatate (TESSALON) 200 MG capsule Take 200 mg by mouth 3 (three) times daily as needed for cough.   DM-Doxylamine-Acetaminophen (NYQUIL COLD & FLU PO) Take 1 Dose by mouth 2 (two) times daily as needed (cough, cold).   etodolac (  LODINE) 400 MG tablet Take 400 mg by mouth daily.   finasteride (PROSCAR) 5 MG tablet Take 5 mg by mouth daily.   Fluticasone-Umeclidin-Vilant (TRELEGY ELLIPTA) 200-62.5-25 MCG/ACT AEPB Inhale 1 puff into the lungs daily.   gabapentin (NEURONTIN) 100 MG capsule Take 100 mg by mouth 3 (three) times daily.   Levothyroxine Sodium 100 MCG CAPS Take 100 mcg by mouth daily before breakfast.   Misc Natural Products (GINSENG COMPLEX PO) Take 1 capsule by mouth daily.    pantoprazole (PROTONIX) 40 MG tablet Take 1 tablet (40 mg total) by mouth daily.   tamsulosin (FLOMAX) 0.4 MG CAPS capsule Take 0.8 mg by mouth daily.   metoprolol succinate (TOPROL-XL) 25 MG 24 hr tablet Take 1 tablet (25 mg total) by mouth daily. Take with or immediately following a meal.   No facility-administered encounter medications on file as of 07/28/2022.     Review of Systems  Review of Systems  N/a  BP 126/68 (BP Location: Left Arm, Patient Position: Sitting, Cuff Size: Normal)   Pulse 64   Temp 98.6 F (37 C) (Oral)   Wt 192 lb (87.1 kg)   SpO2 95%   BMI 27.55 kg/m   Wt Readings from Last 5 Encounters:  07/28/22 192 lb (87.1 kg)  04/27/22 188 lb (85.3 kg)  02/14/22 195 lb 3.2 oz (88.5 kg)  12/19/21 191 lb (86.6 kg)  12/01/21 187 lb 12.8 oz (85.2 kg)    BMI Readings from Last 5 Encounters:  07/28/22 27.55 kg/m  04/27/22 26.98 kg/m  02/14/22 28.01 kg/m  12/19/21 27.41 kg/m  12/01/21 26.95 kg/m     Physical Exam General: Well-appearing, no acute distress Eyes: EOMI, no icterus Neck: Supple, no JVP Pulmonary: Clear, normal work of  breathing Cardiovascular: Regular rate and rhythm, no murmur MSK: No synovitis, joint effusion Neuro: Normal gait, no weakness Psych: Normal mood, full affect   Assessment & Plan:   Dyspnea on exertion: High suspicion for reactive airways disease given air trapping on PFTs.  Acute PE 11/2021 contributing.  Improving with treatment of PE.  Trelegy helped some.  To continue for now.  Asthma: Hyperinflation on chest imaging with air trapping on PFTs.  With worsening symptoms on Breo.  Escalated to Trelegy given uncontrolled symptoms on ICS/LABA.  Has had some improvement in symptoms.  Out of Trelegy for a week.  Samples today.  Many fraction assistance paperwork provided today.  Pulmonary embolus: 11/2021.  No clear inciting event, unprovoked.  Continue anticoagulation.  Recommended contacting PCP and cardiology for samples of Eliquis.  Medication assistance paperwork for Eliquis provided today.   Return in about 3 months (around 10/28/2022).   Lanier Clam, MD 07/28/2022

## 2022-07-28 NOTE — Patient Instructions (Signed)
Nice to see you again  We will fill out paperwork today to see if the drug companies will help [place] pay for the Eliquis for the blood clot in the Trelegy for your asthma.  I provided samples of the Trelegy for 4 weeks.  Use 1 puff once daily as you have been.  Rinse your mouth out with water after every use.  Please contact your cardiologist office and your primary care doctor office and ask if they have samples of the Eliquis that you can get and start taking again.  Take Eliquis 5 mg twice a day.  I think the medications were not because you were in the donut hole with Medicare.  This will likely happen next year.  We will probably need to fill out this paperwork again at the start of the calendar year in January.  Return to clinic in 3 months or sooner as needed with Dr. Silas Flood

## 2022-08-18 ENCOUNTER — Ambulatory Visit (HOSPITAL_BASED_OUTPATIENT_CLINIC_OR_DEPARTMENT_OTHER): Payer: Medicare Other | Admitting: Cardiology

## 2022-08-18 VITALS — BP 132/78 | HR 55 | Ht 70.0 in | Wt 188.0 lb

## 2022-08-18 DIAGNOSIS — I421 Obstructive hypertrophic cardiomyopathy: Secondary | ICD-10-CM | POA: Diagnosis not present

## 2022-08-18 DIAGNOSIS — Z86711 Personal history of pulmonary embolism: Secondary | ICD-10-CM | POA: Diagnosis not present

## 2022-08-18 DIAGNOSIS — I452 Bifascicular block: Secondary | ICD-10-CM

## 2022-08-18 DIAGNOSIS — R0602 Shortness of breath: Secondary | ICD-10-CM | POA: Diagnosis not present

## 2022-08-18 DIAGNOSIS — I440 Atrioventricular block, first degree: Secondary | ICD-10-CM

## 2022-08-18 NOTE — Progress Notes (Signed)
Cardiology Office Note:    Date:  08/18/2022   ID:  Dan Freeman, DOB May 01, 1931, MRN [id number]  PCP:  Vonna Drafts, FNP  Cardiologist:  Buford Dresser, MD PhD  Referring MD: Vonna Drafts, FNP   CC: follow up  History of Present Illness:    Dan Freeman is a 88 y.o. male with a hx of hypertension, COPD, prostate cancer s/p radiation, s/p esophagectomy 2/2 diverticulum, hypothyroidism who is seen for follow up today. I initially met him during his hospitalization 9/12-9/16/19 for shortness of breath (in the context of recent pneumonia, on inhalers/antibiotics/steroids) and mildly elevated/flat troponin. He did have near syncope with a coughing spell, thought to be 2/2 dynamic outflow obstruction from HOCM.   Patient was last seen by me s/p hospitalization in 11/2021 wherein he was found to have saddle pulmonary embolism with right heart strain on CT. He was tolerating Apixaban well with no significant bleeding issues and his breathing was improving at that time. He reports that bending/squatting worsened his shortness of breath.  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 Also does a lot of mechanical work, hard for him to stay still.   Today, he states that he has been doing okay. However, he has had some bothersome shortness of breath with an accompanying dry cough. He feels that his breathing never returned to normal since his PE in February 2023. His shortness of breath is most severe when he is active. His breathing is worsened with minimal activity such as overhead reaching, bending over, or squatting.  He feels lightheaded with standing after prolonged periods of sitting. He feels that his appetite is good. He states that his wife tells him that he is not drinking enough water. He reports that he will forget to drink water if he is busy with an activity for several hours at a time.  He reports  intermittent wheezing. He denies any fever or chills.  He states that he is in his insurance's donut hole and his Eliquis is now $600 for a refill.  The patient denies chest pain, chest pressure, PND, orthopnea, or leg swelling. Denies syncope. He denies any hematuria or blood in his stools.  He is dealing with chronic pain secondary to right RTC tears and herniated discs.  Past Medical History:  Diagnosis Date   Arthritis    Cancer ([facility])     prostrate     radiation   COPD (chronic obstructive pulmonary disease) ([place])    Esophageal diverticulum    s/p esophageal resection   Essential hypertension    HOCM (hypertrophic obstructive cardiomyopathy) ([facility])    Not congenital, likely 2/2 uncontrolled HBP   Holosystolic murmur    6/80/32 echo shows LVOT stenosis consistent w/ HOCM and likely 2/2 uncontrolled HBP     Past Surgical History:  Procedure Laterality Date   ESOPHAGECTOMY     SHOULDER ARTHROSCOPY  01/05/2012   Procedure: ARTHROSCOPY SHOULDER;  Surgeon: Sharmon Revere, MD;  Location: [facility];  Service: Orthopedics;  Laterality: Right;  acromioplasty    Current Medications: Current Outpatient Medications on File Prior to Visit  Medication Sig   albuterol (PROAIR HFA) 108 (90 Base) MCG/ACT inhaler Inhale 2 puffs into the lungs every 6 (six) hours as needed for wheezing or shortness of breath.   albuterol (VENTOLIN HFA) 108 (90 Base) MCG/ACT inhaler Inhale 2 puffs into the lungs every 6 (six) hours as  needed for shortness of breath.   apixaban (ELIQUIS) 5 MG TABS tablet Take 1 tablet (5 mg total) by mouth 2 (two) times daily.   b complex vitamins tablet Take 1 tablet by mouth daily.   benzonatate (TESSALON) 200 MG capsule Take 200 mg by mouth 3 (three) times daily as needed for cough.   DM-Doxylamine-Acetaminophen (NYQUIL COLD & FLU PO) Take 1 Dose by mouth 2 (two) times daily as needed (cough, cold).   finasteride (PROSCAR) 5 MG tablet Take 5 mg by mouth daily.    Fluticasone-Umeclidin-Vilant (TRELEGY ELLIPTA) 200-62.5-25 MCG/ACT AEPB Inhale 1 puff into the lungs daily.   Fluticasone-Umeclidin-Vilant (TRELEGY ELLIPTA) 200-62.5-25 MCG/ACT AEPB Inhale 1 puff into the lungs daily.   gabapentin (NEURONTIN) 100 MG capsule Take 100 mg by mouth 3 (three) times daily.   Levothyroxine Sodium 100 MCG CAPS Take 100 mcg by mouth daily before breakfast.   metoprolol succinate (TOPROL-XL) 25 MG 24 hr tablet Take 1 tablet (25 mg total) by mouth daily. Take with or immediately following a meal.   tamsulosin (FLOMAX) 0.4 MG CAPS capsule Take 0.8 mg by mouth daily.   etodolac (LODINE) 400 MG tablet Take 400 mg by mouth daily. (Patient not taking: Reported on 08/18/2022)   Misc Natural Products (GINSENG COMPLEX PO) Take 1 capsule by mouth daily.  (Patient not taking: Reported on 08/18/2022)   pantoprazole (PROTONIX) 40 MG tablet Take 1 tablet (40 mg total) by mouth daily. (Patient not taking: Reported on 08/18/2022)   No current facility-administered medications on file prior to visit.     Allergies:   Patient has no known allergies.   Social History   Tobacco Use   Smoking status: Never   Smokeless tobacco: Never  Vaping Use   Vaping Use: Never used  Substance Use Topics   Alcohol use: No   Drug use: No    Family History: The patient's family history includes Cancer in his brother; Diabetes Mellitus II in his sister.  ROS:   Please see the history of present illness.   (+) Shortness of breath (+) Cough (+) Wheezing (+) Lightheadedness (+) Back pain, chronic (+) Right shoulder pain, chronic Additional pertinent ROS otherwise unremarkable.   EKGs/Labs/Other Studies Reviewed:    The following studies were reviewed today:  Echo 11/27/21 1. Left ventricular ejection fraction, by estimation, is 65 to 70%. The  left ventricle has normal function. The left ventricle has no regional  wall motion abnormalities. There is moderate concentric left ventricular   hypertrophy. Left ventricular  diastolic parameters are consistent with Grade I diastolic dysfunction  (impaired relaxation).   2. There appears to be mild systolic anterior motion of the anterior  mitral valve leaflet with trivial to mild MR. LVOT gradient 7mHg. This  is improved from prior TTE in 2019 where there was severe SAM with a mean  gradient of 26mg.   3. Right ventricular systolic function is normal. The right ventricular  size is normal. Tricuspid regurgitation signal is inadequate for assessing  PA pressure.   4. The mitral valve is normal in structure. Mild mitral valve  regurgitation. There is mild SAM as detailed above   5. The aortic valve is tricuspid. There is mild thickening of the aortic  valve. Aortic valve regurgitation is not visualized. Aortic valve  sclerosis/calcification is present, without any evidence of aortic  stenosis.   6. The inferior vena cava is normal in size with greater than 50%  respiratory variability, suggesting right atrial pressure of 3  mmHg.   Comparison(s): Compared to prior TTE in 2019, the SAM appears mild on  current study with amean gradient of 47mHg compared to 235mg on last  study. Otherwise, there is no significant change.   TTE 06/28/18 Study Conclusions - Left ventricle: The cavity size was normal. There was moderate   concentric hypertrophy. Systolic function was hyperdynamic. The   estimated ejection fraction was in the range of 75% to 80%. Wall   motion was normal; there were no regional wall motion   abnormalities. - Mitral valve: There was probable severe systolic anterior motion   of the anterior leaflet. There was mild regurgitation. - Left atrium: The atrium was mildly dilated. 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 Images are technically difficult   and cannot entirely exclude a component of valvular aortic    stenosis. Recommendations: 1. Consider transesophageal echocardiography if clinically    indicated to clarify mechanism of LV outflow tract stenosis. 2. Possible hypertrophic obstructive cardiomyopathy.  EKG:  The ekg is personally reviewed today. 08/18/22: not ordered today 11/25/21: SR, 1st degree AV block, RBBB, LAFB, LVH at 64 bpm 08/12/20: SR, 1st degree AV block, RBBB, LAFB at 80 bpm  Recent Labs: 11/25/2021: TSH 0.960 11/28/2021: ALT 13; B Natriuretic Peptide 109.1; BUN 24; Creatinine, Ser 1.23; Hemoglobin 11.2; Magnesium 2.0; Platelets 180; Potassium 4.4; Sodium 136  Recent Lipid Panel No results found for: "CHOL", "TRIG", "HDL", "CHOLHDL", "VLDL", "LDLCALC", "LDLDIRECT"  Physical Exam:    VS:  BP 132/78 (BP Location: Right Arm, Patient Position: Sitting)   Pulse (!) 55   Ht 5' 10" (1.778 m)   Wt 188 lb (85.3 kg)   SpO2 99%   BMI 26.98 kg/m     Wt Readings from Last 3 Encounters:  08/18/22 188 lb (85.3 kg)  07/28/22 192 lb (87.1 kg)  04/27/22 188 lb (85.3 kg)    GEN: Well nourished, well developed in no acute distress HEENT: Normal, moist mucous membranes NECK: No JVD CARDIAC: regular rhythm, normal S1 and S2, no rubs or gallops. 1/6 systolic murmur VASCULAR: Radial and DP pulses 2+ bilaterally. No carotid bruits RESPIRATORY:  Clear to auscultation without rales, wheezing or rhonchi  ABDOMEN: Soft, non-tender, non-distended MUSCULOSKELETAL:  Ambulates independently SKIN: Warm and dry, no edema NEUROLOGIC:  Alert and oriented x 3. No focal neuro deficits noted. PSYCHIATRIC:  Normal affect    ASSESSMENT:    1. Shortness of breath   2. History of pulmonary embolism   3. [place]  4. RBBB (right bundle branch block with left anterior fascicular block)   5. First degree AV block    PLAN:    Shortness of breath: -never normalized after PE. Had right heart strain on initial CT (but not on echo) -repeat echo -also has  COPD, followed by pulmonology  HOCM with LVOT obstruction -tolerating metoprolol. Cautious use given conduction disease -counseled on avoiding dehydration -previously discussed, no plans for ICD -previously discussed that this can be genetic, recommendations for first degree family member screening  Prior saddle pulmonary embolism -no right heart strain on echo (concern for it on CT) -doing well on anticoagulation  Significant conduction disease: 1st degree AV block, RBBB, LAFB -denies symptoms concerning for high degree conduction disease (ie further syncope) -see risk/benefit above re: metoprolol, monitor symptoms  Plan for follow up: 6 months or sooner if needed  Medication Adjustments/Labs and Tests Ordered: Current medicines are  reviewed at length with the patient today.  Concerns regarding medicines are outlined above.  Orders Placed This Encounter  Procedures   ECHOCARDIOGRAM COMPLETE   No orders of the defined types were placed in this encounter.   Patient Instructions  Medication Instructions:  Your Physician recommend you continue on your current medication as directed.    *If you need a refill on your cardiac medications before your next appointment, please call your pharmacy*   Lab Work: None ordered today   Testing/Procedures: Your physician has requested that you have an echocardiogram. Echocardiography is a painless test that uses sound waves to create images of your heart. It provides your doctor with information about the size and shape of your heart and how well your heart's chambers and valves are working. This procedure takes approximately one hour. There are no restrictions for this procedure. [place], you and your health needs are our priority.  As part of our continuing mission to provide you with exceptional heart care, we have created designated Provider Care Teams.  These Care Teams  include your primary Cardiologist (physician) and Advanced Practice Providers (APPs -  Physician Assistants and Nurse Practitioners) who all work together to provide you with the care you need, when you need it.  We recommend signing up for the patient portal called "MyChart".  Sign up information is provided on this After Visit Summary.  MyChart is used to connect with patients for Virtual Visits (Telemedicine).  Patients are able to view lab/test results, encounter notes, upcoming appointments, etc.  Non-urgent messages can be sent to your provider as well.   To learn more about what you can do with MyChart, go to [email or web address].    Your next appointment:   6 month(s)  The format for your next appointment:   In Person  Provider:   Buford Dresser, MD         I,Alexis Herring,acting as a scribe for Buford Dresser, MD.,have documented all relevant documentation on the behalf of Buford Dresser, MD,as directed by  Buford Dresser, MD while in the presence of Buford Dresser, MD.  I, Buford Dresser, MD, have reviewed all documentation for this visit. The documentation on 09/14/22 for the exam, diagnosis, procedures, and orders are all accurate and complete.   Signed, Buford Dresser, MD PhD 08/18/2022     [place]

## 2022-08-18 NOTE — Patient Instructions (Signed)
Medication Instructions:  Your Physician recommend you continue on your current medication as directed.    *If you need a refill on your cardiac medications before your next appointment, please call your pharmacy*   Lab Work: None ordered today   Testing/Procedures: Your physician has requested that you have an echocardiogram. Echocardiography is a painless test that uses sound waves to create images of your heart. It provides your doctor with information about the size and shape of your heart and how well your heart's chambers and valves are working. This procedure takes approximately one hour. There are no restrictions for this procedure. [place], you and your health needs are our priority.  As part of our continuing mission to provide you with exceptional heart care, we have created designated Provider Care Teams.  These Care Teams include your primary Cardiologist (physician) and Advanced Practice Providers (APPs -  Physician Assistants and Nurse Practitioners) who all work together to provide you with the care you need, when you need it.  We recommend signing up for the patient portal called "MyChart".  Sign up information is provided on this After Visit Summary.  MyChart is used to connect with patients for Virtual Visits (Telemedicine).  Patients are able to view lab/test results, encounter notes, upcoming appointments, etc.  Non-urgent messages can be sent to your provider as well.   To learn more about what you can do with MyChart, go to [email or web address].    Your next appointment:   6 month(s)  The format for your next appointment:   In Person  Provider:   Buford Dresser, MD

## 2022-08-29 ENCOUNTER — Ambulatory Visit (INDEPENDENT_AMBULATORY_CARE_PROVIDER_SITE_OTHER): Payer: Medicare Other

## 2022-08-29 DIAGNOSIS — I421 Obstructive hypertrophic cardiomyopathy: Secondary | ICD-10-CM

## 2022-08-29 DIAGNOSIS — R0602 Shortness of breath: Secondary | ICD-10-CM

## 2022-08-29 DIAGNOSIS — Z86711 Personal history of pulmonary embolism: Secondary | ICD-10-CM

## 2022-09-14 ENCOUNTER — Encounter (HOSPITAL_BASED_OUTPATIENT_CLINIC_OR_DEPARTMENT_OTHER): Payer: Self-pay | Admitting: Cardiology

## 2022-10-02 ENCOUNTER — Ambulatory Visit (INDEPENDENT_AMBULATORY_CARE_PROVIDER_SITE_OTHER): Payer: Medicare Other | Admitting: Nurse Practitioner

## 2022-10-02 ENCOUNTER — Encounter: Payer: Self-pay | Admitting: Nurse Practitioner

## 2022-10-02 VITALS — BP 136/82 | HR 82 | Ht 70.0 in | Wt 187.6 lb

## 2022-10-02 DIAGNOSIS — J449 Chronic obstructive pulmonary disease, unspecified: Secondary | ICD-10-CM

## 2022-10-02 DIAGNOSIS — J4489 Other specified chronic obstructive pulmonary disease: Secondary | ICD-10-CM | POA: Diagnosis not present

## 2022-10-02 DIAGNOSIS — I2602 Saddle embolus of pulmonary artery with acute cor pulmonale: Secondary | ICD-10-CM | POA: Diagnosis not present

## 2022-10-02 DIAGNOSIS — J4540 Moderate persistent asthma, uncomplicated: Secondary | ICD-10-CM

## 2022-10-02 LAB — POCT EXHALED NITRIC OXIDE: FeNO level (ppb): 17

## 2022-10-02 NOTE — Patient Instructions (Addendum)
-  Continue Trelegy 1 puff daily, brush tongue and rinse mouth afterwards -Continue Albuterol inhaler 2 puffs every 6 hours as needed for shortness of breath or wheezing. Notify if symptoms persist despite rescue inhaler/neb use. -Continue blood thinner as directed by your primary care provider until Eliquis is available again. Monitor for excessive bleeding/bruising  -Continue protonix 40 mg daily    Referred to pulmonary rehab - someone will contact you for scheduling this   Follow up in 3 months with Dr. Silas Flood. If symptoms worsen, please contact office for sooner follow up or seek emergency care.

## 2022-10-02 NOTE — Assessment & Plan Note (Signed)
Submassive PE in February 2023; started on Eliquis. Sounds like he is on Xarelto now. His PCP is managing his anticoagulation. He is compliant with the medications he is prescribed. No excessive bleeding or bruising.

## 2022-10-02 NOTE — Assessment & Plan Note (Signed)
Asthma with COPD. He continues to struggle with dyspnea since his PE. Recent echo reassuring. Does not appear to be in acute exacerbation. FeNO nl today. I think his symptoms are likely related to deconditioning post PE/hospitalization. We will make referral to pulmonary rehab. Continue on triple therapy regimen. Encouraged him to work on graded exercises. Asthma action plan in place.  Patient Instructions  -Continue Trelegy 1 puff daily, brush tongue and rinse mouth afterwards -Continue Albuterol inhaler 2 puffs every 6 hours as needed for shortness of breath or wheezing. Notify if symptoms persist despite rescue inhaler/neb use. -Continue blood thinner as directed by your primary care provider until Eliquis is available again. Monitor for excessive bleeding/bruising  -Continue protonix 40 mg daily    Referred to pulmonary rehab - someone will contact you for scheduling this   Follow up in 3 months with Dr. Silas Flood. If symptoms worsen, please contact office for sooner follow up or seek emergency care.

## 2022-10-02 NOTE — Progress Notes (Signed)
$'@Patient'l$  ID: Dan Freeman, male    DOB: 07/22/1931, 88 y.o.   MRN: [id number]  Chief Complaint  Patient presents with   Follow-up    Pt f/u, he states he feels like his breathing is worse. Having increased SOB    Referring provider: Vonna Drafts, FNP  HPI: 88 year old male, never smoker followed for dyspnea upon exertion, history of asthma, and asthma. He is a patient of Dr. Kavin Leech and was last seen in office on 07/28/2022. Past medical history significant for hypertension, hypertrophic obstructive cardiomyopathy, right BBB, first-degree block, hypothyroidism.   TEST/EVENTS:  11/25/2021 CXR 2 view: hyperinflation. Mild cardiomegaly. There is pleuroparenchymal scarring left hemithorax. Calcified granuloma in the left apex.  11/25/2021 CTA chest: atherosclerosis. Saddle embolus straddling bifurcation of the pulmonary trunk extending into the central and lobar pulmonary artery branches b/l. 1 cm calcified LUL granuloma. Bibasilar linear atelectasis/scaring. Presence of right heart strain 11/26/2021 echocardiogram: EF 65-70%. Moderate LVH. G1DD. Mild MR. RV function and size normal. Both atriums were normal in size 12/08/2021 PFTs: FVC 2.47 (70), FEV1 1.66 (66), ratio 67, TLC 82%, DLCOcor 84%. No BD. Moderate obstructive disease with normal diffusion capacity   11/24/2021: OV with Dr. Silas Flood.  At previous visit was escalated to Trelegy in the setting of clinical diagnosis of asthma.  Evidence of hyperinflation on previous chest imaging to support this in the setting of never smoker.  Feels some improvement however still pretty dyspneic.  PFTs previously ordered however were never scheduled.  Scheduled at this visit.  Discussed changing inhaler from DPI to HFA in future if not improving.  Suspect dyspnea multifactorial given left atrial dilation and HOCM.   11/25/2021-11/28/2021: Hospital admission for saddle PE and related acute respiratory failure. IR advised anticoagulation over  thrombectomy. Started on lovenox and transitioned to Eliquis upon discharged. Right heart strain on echo. Hx of prostate cancer with neg PSA. Some testicular pain in Jan 2023 - [place] testes with moderate left hydrocele and plans to follow up with urology.   12/01/2021: OV with Shreeya Recendiz NP for hospital follow up for unprovoked PE. Feeling better; occasional DOE. On started pack of Eliquis. Continued on Trelegy and PRN albuterol.   12/19/2021: OV with Harlee Eckroth NP for follow up. He had pulmonary function testing done at the end of last month which showed moderate obstructive defect with normal diffusion capacity without a bronchodilator response which we discussed today. He reported that he is still having some shortness of breath upon exertion, which is unchanged from his last visit. He does not have any issues with coughing or notice much wheezing. He denies orthopnea, PND, chest pain or lower extremity swelling. He continues on his Trelegy daily and uses his albuterol every other or once a day for shortness of breath. Since we saw each other last, he has started on his maintenance dosing of Eliquis 5 mg Twice daily and has not had any problems with excessive bleeding/bruising.  04/27/2022: OV with Dr. Silas Flood. F/u of DOE and reported COPD although no formal PFTs. Presumed asthma. Continues on Trelegy; feels as though this helps. Main complaint is chest congestion and some cough. Trial prednisone 10 mg for 5 days. Continued on anticoagulation for unprovoked PE.   07/28/2022: OV with Dr. Silas Flood. Returns for follow up. Trelegy has been helpful. Ran out about a week ago. Same with Eliquis. Price at the pharmacy went up. Likely in the donut hole. Increased congestion and wheeze. Mild increase I DOE since being off Trelegy -  samples provided today. Patient assistance provided for both this ad Eliquis. He will follow up with cardiology and his PCP to check on samples.   10/02/2022: Today - follow up Patient presents today  for follow up. He has been feeling about the same since he was here last. Still has shortness of breath when he gets to doing things too quickly. Otherwise, no limitation in daily activities. He occasionally has some wheezing. Denies any cough, chest congestion, orthopnea, PND, leg swelling. He is still using Trelegy daily. Doesn't feel like he's as active as he was in the past as he has trouble with both rotator cuffs. He does do exercises for these daily. He is currently off Eliquis and on a different blood thinner, which he doesn't remember the name of but it was provided by his PCP. He says it's triangle shaped; taking it daily. He's waiting until he is out of the donut hole and then he'll restart Eliquis.   No Known Allergies  Immunization History  Administered Date(s) Administered   Influenza, High Dose Seasonal PF 10/14/2014, 07/29/2019   Influenza,inj,Quad PF,6+ Mos 08/14/2016, 09/07/2017, 07/08/2018   Influenza-Unspecified 09/30/2008, 10/25/2009, 07/26/2010, 08/15/2011, 08/28/2013, 07/25/2015, 08/14/2016, 08/21/2021   Pneumococcal Conjugate-13 08/14/2016   Pneumococcal Polysaccharide-23 08/02/2011    Past Medical History:  Diagnosis Date   Arthritis    Cancer ([place])     prostrate     radiation   COPD (chronic obstructive pulmonary disease) ([place])    Esophageal diverticulum    s/p esophageal resection   Essential hypertension    HOCM (hypertrophic obstructive cardiomyopathy) ([place])    Not congenital, likely 2/2 uncontrolled HBP   Holosystolic murmur    0/62/69 echo shows LVOT stenosis consistent w/ HOCM and likely 2/2 uncontrolled HBP     Tobacco History: Social History   Tobacco Use  Smoking Status Never  Smokeless Tobacco Never   Counseling given: Not Answered   Outpatient Medications Prior to Visit  Medication Sig Dispense Refill   albuterol (PROAIR HFA) 108 (90 Base) MCG/ACT inhaler Inhale 2 puffs into the lungs every 6 (six) hours as needed for wheezing or shortness  of breath. 1 each 11   albuterol (VENTOLIN HFA) 108 (90 Base) MCG/ACT inhaler Inhale 2 puffs into the lungs every 6 (six) hours as needed for shortness of breath. 1 each 11   apixaban (ELIQUIS) 5 MG TABS tablet Take 1 tablet (5 mg total) by mouth 2 (two) times daily. 60 tablet 0   b complex vitamins tablet Take 1 tablet by mouth daily.     benzonatate (TESSALON) 200 MG capsule Take 200 mg by mouth 3 (three) times daily as needed for cough.     DM-Doxylamine-Acetaminophen (NYQUIL COLD & FLU PO) Take 1 Dose by mouth 2 (two) times daily as needed (cough, cold).     etodolac (LODINE) 400 MG tablet Take 400 mg by mouth daily.     finasteride (PROSCAR) 5 MG tablet Take 5 mg by mouth daily.     Fluticasone-Umeclidin-Vilant (TRELEGY ELLIPTA) 200-62.5-25 MCG/ACT AEPB Inhale 1 puff into the lungs daily. 60 each 3   Fluticasone-Umeclidin-Vilant (TRELEGY ELLIPTA) 200-62.5-25 MCG/ACT AEPB Inhale 1 puff into the lungs daily. 1 each 0   gabapentin (NEURONTIN) 100 MG capsule Take 100 mg by mouth 3 (three) times daily.     Levothyroxine Sodium 100 MCG CAPS Take 100 mcg by mouth daily before breakfast.     Misc Natural Products (GINSENG COMPLEX PO) Take 1 capsule by mouth daily.  pantoprazole (PROTONIX) 40 MG tablet Take 1 tablet (40 mg total) by mouth daily. 30 tablet 0   tamsulosin (FLOMAX) 0.4 MG CAPS capsule Take 0.8 mg by mouth daily.     metoprolol succinate (TOPROL-XL) 25 MG 24 hr tablet Take 1 tablet (25 mg total) by mouth daily. Take with or immediately following a meal. 90 tablet 3   No facility-administered medications prior to visit.     Review of Systems:   Constitutional: No weight loss or gain, night sweats, fevers, chills, fatigue, or lassitude. HEENT: No headaches, difficulty swallowing, tooth/dental problems, or sore throat. No sneezing, itching, ear ache, nasal congestion, or post nasal drip CV:  No chest pain, orthopnea, PND, swelling in lower extremities, anasarca, dizziness,  palpitations, syncope Resp: +shortness of breath with exertion. No excess mucus or change in color of mucus. No productive or non-productive. No hemoptysis. No wheezing.  No chest wall deformity GI:  No heartburn, indigestion, abdominal pain, nausea, vomiting, diarrhea, change in bowel habits, loss of appetite, bloody stools.  GU: No dysuria, change in color of urine, urgency or frequency. Skin: No rash, lesions, ulcerations MSK:  No joint pain or swelling.   Neuro: No dizziness or lightheadedness.  Psych: No depression or anxiety. Mood stable.     Physical Exam:  BP 136/82   Pulse 82   Ht '5\' 10"'$  (1.778 m)   Wt 187 lb 9.6 oz (85.1 kg)   SpO2 98%   BMI 26.92 kg/m   GEN: Pleasant, interactive, well-appearing; in no acute distress. HEENT:  Normocephalic and atraumatic. PERRLA. Sclera white. Nasal turbinates pink, moist and patent bilaterally. No rhinorrhea present. Oropharynx pink and moist, without exudate or edema. No lesions, ulcerations, or postnasal drip.  NECK:  Supple w/ fair ROM. No JVD present. Normal carotid impulses w/o bruits. Thyroid symmetrical with no goiter or nodules palpated. No lymphadenopathy.   CV: RRR, no m/r/g, no peripheral edema. Pulses intact, +2 bilaterally. No cyanosis, pallor or clubbing. PULMONARY:  Unlabored, regular breathing. Clear bilaterally A&P w/o wheezes/rales/rhonchi. No accessory muscle use. No dullness to percussion. GI: BS present and normoactive. Soft, non-tender to palpation. No organomegaly or masses detected.  MSK: No erythema, warmth or tenderness. Cap refil <2 sec all extrem. No deformities or joint swelling noted.  Neuro: A/Ox3. No focal deficits noted.   Skin: Warm, no lesions or rashe Psych: Normal affect and behavior. Judgement and thought content appropriate.     Lab Results:  CBC    Component Value Date/Time   WBC 4.9 11/28/2021 0052   RBC 3.98 (L) 11/28/2021 0052   HGB 11.2 (L) 11/28/2021 0052   HCT 33.7 (L) 11/28/2021 0052    PLT 180 11/28/2021 0052   MCV 84.7 11/28/2021 0052   MCH 28.1 11/28/2021 0052   MCHC 33.2 11/28/2021 0052   RDW 15.0 11/28/2021 0052   LYMPHSABS 1.9 11/28/2021 0052   MONOABS 0.5 11/28/2021 0052   EOSABS 0.2 11/28/2021 0052   BASOSABS 0.0 11/28/2021 0052    BMET    Component Value Date/Time   NA 136 11/28/2021 0052   K 4.4 11/28/2021 0052   CL 106 11/28/2021 0052   CO2 22 11/28/2021 0052   GLUCOSE 117 (H) 11/28/2021 0052   BUN 24 (H) 11/28/2021 0052   CREATININE 1.23 11/28/2021 0052   CALCIUM 8.8 (L) 11/28/2021 0052   GFRNONAA 56 (L) 11/28/2021 0052   GFRAA >60 06/29/2018 0422    BNP    Component Value Date/Time   BNP 109.1 (H) 11/28/2021  5361     Imaging:  No results found.       Latest Ref Rng & Units 12/08/2021    3:53 PM  PFT Results  FVC-Pre L 2.47   FVC-Predicted Pre % 70   FVC-Post L 2.47   FVC-Predicted Post % 70   Pre FEV1/FVC % % 67   Post FEV1/FCV % % 67   FEV1-Pre L 1.65   FEV1-Predicted Pre % 66   FEV1-Post L 1.66   DLCO uncorrected ml/min/mmHg 17.63   DLCO UNC% % 75   DLCO corrected ml/min/mmHg 19.83   DLCO COR %Predicted % 84   DLVA Predicted % 123   TLC L 5.82   TLC % Predicted % 82   RV % Predicted % 124     No results found for: "NITRICOXIDE"      Assessment & Plan:   Asthma with COPD Asthma with COPD. He continues to struggle with dyspnea since his PE. Recent echo reassuring. Does not appear to be in acute exacerbation. FeNO nl today. I think his symptoms are likely related to deconditioning post PE/hospitalization. We will make referral to pulmonary rehab. Continue on triple therapy regimen. Encouraged him to work on graded exercises. Asthma action plan in place.  Patient Instructions  -Continue Trelegy 1 puff daily, brush tongue and rinse mouth afterwards -Continue Albuterol inhaler 2 puffs every 6 hours as needed for shortness of breath or wheezing. Notify if symptoms persist despite rescue inhaler/neb use. -Continue  blood thinner as directed by your primary care provider until Eliquis is available again. Monitor for excessive bleeding/bruising  -Continue protonix 40 mg daily    Referred to pulmonary rehab - someone will contact you for scheduling this   Follow up in 3 months with Dr. Silas Flood. If symptoms worsen, please contact office for sooner follow up or seek emergency care.   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 Sounds like he is on Xarelto now. His PCP is managing his anticoagulation. He is compliant with the medications he is prescribed. No excessive bleeding or bruising.     I spent 35 minutes of dedicated to the care of this patient on the date of this encounter to include pre-visit review of records, face-to-face time with the patient discussing conditions above, post visit ordering of testing, clinical documentation with the electronic health record, making appropriate referrals as documented, and communicating necessary findings to members of the patients care team.  Clayton Bibles, NP 10/02/2022  Pt aware and understands NP's role.

## 2022-11-01 ENCOUNTER — Other Ambulatory Visit: Payer: Self-pay | Admitting: Family Medicine

## 2022-11-01 ENCOUNTER — Telehealth (HOSPITAL_COMMUNITY): Payer: Self-pay

## 2022-11-01 ENCOUNTER — Ambulatory Visit
Admission: RE | Admit: 2022-11-01 | Discharge: 2022-11-01 | Disposition: A | Discharge status: Home / Self Care | Payer: Medicare Other | Source: Ambulatory Visit | Attending: Family Medicine | Admitting: Family Medicine

## 2022-11-01 DIAGNOSIS — S098XXA Other specified injuries of head, initial encounter: Secondary | ICD-10-CM

## 2022-11-01 DIAGNOSIS — W19XXXA Unspecified fall, initial encounter: Secondary | ICD-10-CM

## 2022-11-01 NOTE — Telephone Encounter (Signed)
Called and spoke with pt and his wife Dan Freeman, Dan Freeman stated pt fell on 1/16 and fractured  his hip. She also stated they was going to get a CAT scan done today and would call [facility] back at a later date to get scheduled.   Placed pt ppw back in [facility] ready to schedule bin.

## 2022-12-04 ENCOUNTER — Telehealth (HOSPITAL_COMMUNITY): Payer: Self-pay

## 2022-12-04 NOTE — Telephone Encounter (Signed)
Called and spoke with pt wife [place] in regards to pt Pulmonary rehab. [place] stated pt is unable to participate at this time due to health issues.   Closed referral

## 2023-02-16 ENCOUNTER — Ambulatory Visit (INDEPENDENT_AMBULATORY_CARE_PROVIDER_SITE_OTHER): Payer: Medicare Other | Admitting: Cardiology

## 2023-02-16 ENCOUNTER — Encounter (HOSPITAL_BASED_OUTPATIENT_CLINIC_OR_DEPARTMENT_OTHER): Payer: Self-pay | Admitting: Cardiology

## 2023-02-16 VITALS — BP 118/88 | HR 52 | Ht 70.0 in | Wt 187.0 lb

## 2023-02-16 DIAGNOSIS — I452 Bifascicular block: Secondary | ICD-10-CM | POA: Diagnosis not present

## 2023-02-16 DIAGNOSIS — I421 Obstructive hypertrophic cardiomyopathy: Secondary | ICD-10-CM | POA: Diagnosis not present

## 2023-02-16 DIAGNOSIS — R0602 Shortness of breath: Secondary | ICD-10-CM

## 2023-02-16 DIAGNOSIS — I440 Atrioventricular block, first degree: Secondary | ICD-10-CM

## 2023-02-16 DIAGNOSIS — Z86711 Personal history of pulmonary embolism: Secondary | ICD-10-CM | POA: Diagnosis not present

## 2023-02-16 NOTE — Patient Instructions (Addendum)
Medication Instructions:  We have notes that you were put on Xarelto by your primary care; the last eliquis prescription we have was from February. Your blood clot was last June, so how long you need to be on the blood thinner is up to your primary doctor. We sometimes recommend 6 mos for a first blood clot, but sometimes we discuss lifelong blood thinners. Please confirm with your primary care how long you need to be on blood thinner and which one you are on and let us know.   *If you need a refill on your cardiac medications before your next appointment, please call your pharmacy*   Lab Work: N/A  Testing/Procedures: None   Follow-Up: At [facility], you and your health needs are our priority.  As part of our continuing mission to provide you with exceptional heart care, we have created designated Provider Care Teams.  These Care Teams include your primary Cardiologist (physician) and Advanced Practice Providers (APPs -  Physician Assistants and Nurse Practitioners) who all work together to provide you with the care you need, when you need it.  We recommend signing up for the patient portal called "MyChart".  Sign up information is provided on this After Visit Summary.  MyChart is used to connect with patients for Virtual Visits (Telemedicine).  Patients are able to view lab/test results, encounter notes, upcoming appointments, etc.  Non-urgent messages can be sent to your provider as well.   To learn more about what you can do with MyChart, go to [email or web address].    Your next appointment:   6 month(s)  Provider:   Jodelle Red, MD    Other Instructions None

## 2023-02-16 NOTE — Progress Notes (Signed)
Cardiology Office Note:    Date:  02/16/2023   ID:  Dan Freeman, DOB Apr 20, 1931, MRN [id number]  PCP:  Dan Providence, FNP  Cardiologist:  Dan Red, MD PhD  Referring MD: Dan Providence, FNP   CC: follow up  History of Present Illness:    Dan Freeman is a 89 y.o. male with a hx of hypertension, COPD, prostate cancer s/p radiation, s/p esophagectomy 2/2 diverticulum, hypothyroidism who is seen for follow up today. I initially met him during his hospitalization 9/12-9/16/19 for shortness of breath (in the context of recent pneumonia, on inhalers/antibiotics/steroids) and mildly elevated/flat troponin. He did have near syncope with a coughing spell, thought to be 2/2 dynamic outflow obstruction from HOCM.   Had hospitalization in 11/2021 where he was found to have saddle pulmonary embolism with right heart strain on CT. He was tolerating Apixaban well with no significant bleeding issues and his breathing was improving at that time. He reports that bending/squatting worsened his shortness of breath.  Today: [name] and broke his pelvis in two places 6-7 weeks ago, slowly recovering. In a lot of pain. Feels more short of breath than normal, has been gradually worsening over months. Was previously recommended for pulmonary rehab, but this is on hold with his pelvic fracture. Notes intermittent wheezing. No swelling. Chronic dry cough. No fevers/chills. Revewed echo from 08/2022, reassuring.  He notes that he is only getting a few days of eliquis at a time from the pharmacy. I cannot see his PCP notes, but it is unclear to me whether the plan was to continue anticoagulation long term for his PE in 11/2021 or whether the plan was to stop this. Additionally, there are notes that he was changed from apixaban to Xarelto in the past. I recommended he confirm the plan with his PCP.  Past Medical History:  Diagnosis Date   Arthritis    Cancer (HCC)     prostrate     radiation   COPD  (chronic obstructive pulmonary disease) (HCC)    Esophageal diverticulum    s/p esophageal resection   Essential hypertension    HOCM (hypertrophic obstructive cardiomyopathy) (HCC)    Not congenital, likely 2/2 uncontrolled HBP   Holosystolic murmur    06/28/18 echo shows LVOT stenosis consistent w/ HOCM and likely 2/2 uncontrolled HBP     Past Surgical History:  Procedure Laterality Date   ESOPHAGECTOMY     SHOULDER ARTHROSCOPY  01/05/2012   Procedure: ARTHROSCOPY SHOULDER;  Surgeon: Kennieth Rad, MD;  Location: [facility] OR;  Service: Orthopedics;  Laterality: Right;  acromioplasty    Current Medications: Current Outpatient Medications on File Prior to Visit  Medication Sig   albuterol (VENTOLIN HFA) 108 (90 Base) MCG/ACT inhaler Inhale 2 puffs into the lungs every 6 (six) hours as needed for shortness of breath.   apixaban (ELIQUIS) 5 MG TABS tablet Take 1 tablet (5 mg total) by mouth 2 (two) times daily.   Fluticasone-Umeclidin-Vilant (TRELEGY ELLIPTA) 200-62.5-25 MCG/ACT AEPB Inhale 1 puff into the lungs daily.   gabapentin (NEURONTIN) 100 MG capsule Take 100 mg by mouth 3 (three) times daily.   Levothyroxine Sodium 100 MCG CAPS Take 100 mcg by mouth daily before breakfast.   pantoprazole (PROTONIX) 40 MG tablet Take 1 tablet (40 mg total) by mouth daily.   tamsulosin (FLOMAX) 0.4 MG CAPS capsule Take 0.8 mg by mouth daily.   No current facility-administered medications on file prior to visit.     Allergies:  Patient has no known allergies.   Social History   Tobacco Use   Smoking status: Never   Smokeless tobacco: Never  Vaping Use   Vaping Use: Never used  Substance Use Topics   Alcohol use: No   Drug use: No    Family History: The patient's family history includes Cancer in his brother; Diabetes Mellitus II in his sister.  ROS:   Please see the history of present illness.   Additional pertinent ROS otherwise unremarkable.   EKGs/Labs/Other Studies Reviewed:     The following studies were reviewed today: Echo 08/29/22 1. Left ventricular ejection fraction, by estimation, is 50 to 55%. The  left ventricle has low normal function. The left ventricle has no regional  wall motion abnormalities. Left ventricular diastolic parameters were  normal.   2. Right ventricular systolic function is normal. The right ventricular  size is normal.   3. The mitral valve is normal in structure. Mild mitral valve  regurgitation. No evidence of mitral stenosis.   4. The aortic valve is normal in structure. Aortic valve regurgitation is  not visualized. No aortic stenosis is present.   5. The inferior vena cava is normal in size with greater than 50%  respiratory variability, suggesting right atrial pressure of 3 mmHg.   Echo 11/27/21 1. Left ventricular ejection fraction, by estimation, is 65 to 70%. The  left ventricle has normal function. The left ventricle has no regional  wall motion abnormalities. There is moderate concentric left ventricular  hypertrophy. Left ventricular  diastolic parameters are consistent with Grade I diastolic dysfunction  (impaired relaxation).   2. There appears to be mild systolic anterior motion of the anterior  mitral valve leaflet with trivial to mild MR. LVOT gradient . This  is improved from prior TTE in 2019 where there was severe SAM with a mean  gradient of .   3. Right ventricular systolic function is normal. The right ventricular  size is normal. Tricuspid regurgitation signal is inadequate for assessing  PA pressure.   4. The mitral valve is normal in structure. Mild mitral valve  regurgitation. There is mild SAM as detailed above   5. The aortic valve is tricuspid. There is mild thickening of the aortic  valve. Aortic valve regurgitation is not visualized. Aortic valve  sclerosis/calcification is present, without any evidence of aortic  stenosis.   6. The inferior vena cava is normal in size with greater  than 50%  respiratory variability, suggesting right atrial pressure of 3 mmHg.   Comparison(s): Compared to prior TTE in 2019, the SAM appears mild on  current study with amean gradient of compared to on last  study. Otherwise, there is no significant change.   TTE 06/28/18 Study Conclusions - Left ventricle: The cavity size was normal. There was moderate   concentric hypertrophy. Systolic function was hyperdynamic. The   estimated ejection fraction was in the range of 75% to 80%. Wall   motion was normal; there were no regional wall motion   abnormalities. - Mitral valve: There was probable severe systolic anterior motion   of the anterior leaflet. There was mild regurgitation. - Left atrium: The atrium was mildly dilated. 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 Images are technically difficult   and cannot entirely exclude a component of valvular aortic   stenosis. Recommendations: 1. Consider transesophageal echocardiography if clinically    indicated to clarify mechanism of  LV outflow tract stenosis. 2. Possible hypertrophic obstructive cardiomyopathy.  EKG:  The ekg is personally reviewed today. 02/16/23: Sinus bradycardia at 52 bpm, 1st degree AV block, RBBB, LAFB, LVH 11/25/21: SR, 1st degree AV block, RBBB, LAFB, LVH at 64 bpm 08/12/20: SR, 1st degree AV block, RBBB, LAFB at 80 bpm  Recent Labs: No results found for requested labs within last 365 days.  Recent Lipid Panel No results found for: "CHOL", "TRIG", "HDL", "CHOLHDL", "VLDL", "LDLCALC", "LDLDIRECT"  Physical Exam:    VS:  BP 118/88   Pulse (!) 52   Ht 5\' 10"  (1.778 m)   Wt 187 lb (84.8 kg)   BMI 26.83 kg/m     Wt Readings from Last 3 Encounters:  02/16/23 187 lb (84.8 kg)  10/02/22 187 lb 9.6 oz (85.1 kg)  08/18/22 188 lb (85.3 kg)    GEN: Well nourished, well developed in no acute distress HEENT: Normal, moist mucous  membranes NECK: No JVD CARDIAC: regular rhythm, normal S1 and S2, no rubs or gallops. 1/6 systolic murmur. VASCULAR: Radial and DP pulses 2+ bilaterally. No carotid bruits RESPIRATORY:  Distant but clear to auscultation without rales, wheezing or rhonchi  ABDOMEN: Soft, non-tender, non-distended MUSCULOSKELETAL:  Ambulates independently with cane SKIN: Warm and dry, no edema NEUROLOGIC:  Alert and oriented x 3. No focal neuro deficits noted. PSYCHIATRIC:  Normal affect    ASSESSMENT:    1. History of pulmonary embolism   2. HOCM (hypertrophic obstructive cardiomyopathy) (HCC)   3. Shortness of breath   4. First degree AV block   5. RBBB (right bundle branch block with left anterior fascicular block)     PLAN:    Shortness of breath: -never normalized after PE. Had right heart strain on initial CT (but not on echo) -repeat echo reassuring -also has COPD, followed by pulmonology -euvolemic on exam today  HOCM with LVOT obstruction -no longer on metoprolol given bradycardia. Conduction disease stable -counseled on avoiding dehydration -previously discussed, no plans for ICD -previously discussed that this can be genetic, recommendations for first degree family member screening  Prior saddle pulmonary embolism -no right heart strain on echo (concern for it on CT) -doing well on anticoagulation. It is unclear to me what the long term plan was for his anticoagulation. His event was 11/2021. He is not sure if this was to be temporary or long term. Additionally, he notes he is on apixaban now but prior notes comment that he was switched to Xarelto. I recommended he clarify with his PCP was the plan is for this.  Significant conduction disease: 1st degree AV block, RBBB, LAFB -denies symptoms concerning for high degree conduction disease (ie further syncope) -no longer on metoprolol  Plan for follow up: 6 months or sooner if needed  Medication Adjustments/Labs and Tests  Ordered: Current medicines are reviewed at length with the patient today.  Concerns regarding medicines are outlined above.  Orders Placed This Encounter  Procedures   EKG 12-Lead   No orders of the defined types were placed in this encounter.   Patient Instructions  Medication Instructions:  We have notes that you were put on Xarelto by your primary care; the last eliquis prescription we have was from February. Your blood clot was last June, so how long you need to be on the blood thinner is up to your primary doctor. We sometimes recommend 6 mos for a first blood clot, but sometimes we discuss lifelong blood thinners. Please confirm with your primary  care how long you need to be on blood thinner and which one you are on and let us know.   *If you need a refill on your cardiac medications before your next appointment, please call your pharmacy*   Lab Work: N/A  Testing/Procedures: None   Follow-Up: At [facility], you and your health needs are our priority.  As part of our continuing mission to provide you with exceptional heart care, we have created designated Provider Care Teams.  These Care Teams include your primary Cardiologist (physician) and Advanced Practice Providers (APPs -  Physician Assistants and Nurse Practitioners) who all work together to provide you with the care you need, when you need it.  We recommend signing up for the patient portal called "MyChart".  Sign up information is provided on this After Visit Summary.  MyChart is used to connect with patients for Virtual Visits (Telemedicine).  Patients are able to view lab/test results, encounter notes, upcoming appointments, etc.  Non-urgent messages can be sent to your provider as well.   To learn more about what you can do with MyChart, go to [email or web address].    Your next appointment:   6 month(s)  Provider:   Jodelle Red, MD    Other Instructions None  Signed, Dan Red, MD PhD 02/16/2023     [facility]

## 2023-06-13 ENCOUNTER — Ambulatory Visit: Payer: Medicare Other | Admitting: Pulmonary Disease

## 2023-06-13 ENCOUNTER — Encounter: Payer: Self-pay | Admitting: Pulmonary Disease

## 2023-06-13 VITALS — BP 136/84 | HR 72 | Temp 97.8°F | Ht 68.0 in | Wt 182.0 lb

## 2023-06-13 DIAGNOSIS — J4489 Other specified chronic obstructive pulmonary disease: Secondary | ICD-10-CM | POA: Diagnosis not present

## 2023-06-13 DIAGNOSIS — J441 Chronic obstructive pulmonary disease with (acute) exacerbation: Secondary | ICD-10-CM | POA: Diagnosis not present

## 2023-06-13 MED ORDER — TRELEGY ELLIPTA 200-62.5-25 MCG/ACT IN AEPB: 1.0000 | INHALATION_SPRAY | Freq: Every day | RESPIRATORY_TRACT | 11 refills | Status: AC

## 2023-06-13 MED ORDER — AZITHROMYCIN 250 MG PO TABS: ORAL_TABLET | ORAL | 0 refills | Status: AC

## 2023-06-13 MED ORDER — TRELEGY ELLIPTA 200-62.5-25 MCG/ACT IN AEPB: 1.0000 | INHALATION_SPRAY | Freq: Every day | RESPIRATORY_TRACT | 0 refills | Status: AC

## 2023-06-13 MED ORDER — APIXABAN 5 MG PO TABS: 5.0000 mg | ORAL_TABLET | Freq: Two times a day (BID) | ORAL | 0 refills | Status: DC

## 2023-06-13 MED ORDER — PREDNISONE 20 MG PO TABS: 20.0000 mg | ORAL_TABLET | Freq: Every day | ORAL | 0 refills | Status: AC

## 2023-06-13 NOTE — Addendum Note (Signed)
Addended by: Delrae Rend on: 06/13/2023 04:45 PM   Modules accepted: Orders

## 2023-06-13 NOTE — Progress Notes (Signed)
@Patient  ID: Dan Freeman, male    DOB: [birth year], 89 y.o.   MRN: [id number]  Chief Complaint  Patient presents with   Follow-up    Referring provider: Diamantina Providence, FNP  HPI:   89 y.o. man whom we are seeing in follow-up of dyspnea on exertion with PFTs demonstrating significant bronchodilator response.   Patient returns for routine follow-up.  Trelegy has been helpful.  Unfortunate he has had some increased shortness of breath and cough the last few days.  Not a lot of production occasional foamy white.  No fever or chills.  Does not really feel bad.  Denies sick contacts.  Discussed role and rationale for treatment of COPD/asthma exacerbation albeit mild.  HPI at initial visit: Reports history of asthma in past. Diagnosed many years ago. By and large no real symptoms.  However, he endorses worsening dyspnea over the last month.  Associated cough.  Will be worse in the evenings.  No time of day when things are better or worse.  No environmental or seasonal factors he can in the panic is better or worse.  No clear trigger, or viral illness.  He states his breathing has been well maintained on Breo.  But again symptoms worsen last month despite good adherence to this.  He does use albuterol and this helps his symptoms some.  No other alleviating or exacerbating factors.  He reports recent chest x-ray via PCP as I cannot view or see result.  He thinks this was normal.  Most recent xray 2019 clear with mild hyperinflation on lateral view on my review and interpretation. TTE 2019 reviewed with HOCM, LVOT obstruction, dilated LA.  PMH: asthma, HOCM, hypothyroid Surgical history:esophagectomy, shoulder surgery Family history: DM in sister, brother with cancer Social history: Never smoker, lives in [place]   Questionaires / Pulmonary Flowsheets:   ACT:  Asthma Control Test ACT Total Score  07/28/2022  9:28 AM 15    MMRC:     No data to display          Epworth:       No data to display          Tests:   FENO:  No results found for: "NITRICOXIDE"  PFT:    Latest Ref Rng & Units 12/08/2021    3:53 PM  PFT Results  FVC-Pre L 2.47   FVC-Predicted Pre % 70   FVC-Post L 2.47   FVC-Predicted Post % 70   Pre FEV1/FVC % % 67   Post FEV1/FCV % % 67   FEV1-Pre L 1.65   FEV1-Predicted Pre % 66   FEV1-Post L 1.66   DLCO uncorrected ml/min/mmHg 17.63   DLCO UNC% % 75   DLCO corrected ml/min/mmHg 19.83   DLCO COR %Predicted % 84   DLVA Predicted % 123   TLC L 5.82   TLC % Predicted % 82   RV % Predicted % 124   Personally reviewed interpreted spirometry suggestive of mild restriction versus air trapping.  No bronchodilator spots.  Lung volumes confirm air trapping with elevated RV/TLC ratio.  DLCO within normal limits.  WALK:     12/19/2021   10:46 AM  SIX MIN WALK  Supplimental Oxygen during Test? (L/min) No  Tech Comments: patient walked x 1 lap and he reports that he did not feel short of breath.   Imaging: Personally reviewed and as per EMR  Lab Results: Personally reviewed CBC    Component Value Date/Time   WBC  4.9 11/28/2021 0052   RBC 3.98 (L) 11/28/2021 0052   HGB 11.2 (L) 11/28/2021 0052   HCT 33.7 (L) 11/28/2021 0052   PLT 180 11/28/2021 0052   MCV 84.7 11/28/2021 0052   MCH 28.1 11/28/2021 0052   MCHC 33.2 11/28/2021 0052   RDW 15.0 11/28/2021 0052   LYMPHSABS 1.9 11/28/2021 0052   MONOABS 0.5 11/28/2021 0052   EOSABS 0.2 11/28/2021 0052   BASOSABS 0.0 11/28/2021 0052    BMET    Component Value Date/Time   NA 136 11/28/2021 0052   K 4.4 11/28/2021 0052   CL 106 11/28/2021 0052   CO2 22 11/28/2021 0052   GLUCOSE 117 (H) 11/28/2021 0052   BUN 24 (H) 11/28/2021 0052   CREATININE 1.23 11/28/2021 0052   CALCIUM 8.8 (L) 11/28/2021 0052   GFRNONAA 56 (L) 11/28/2021 0052   GFRAA >60 06/29/2018 0422    BNP    Component Value Date/Time   BNP 109.1 (H) 11/28/2021 0052    ProBNP No results found for:  "PROBNP"  Specialty Problems       Pulmonary Problems   COPD with acute exacerbation (HCC)   Moderate COPD (chronic obstructive pulmonary disease) (HCC)   Pneumonia    Currently on abx      Asthma with COPD    No Known Allergies  Immunization History  Administered Date(s) Administered   Influenza, High Dose Seasonal PF 10/14/2014, 07/29/2019   Influenza,inj,Quad PF,6+ Mos 08/14/2016, 09/07/2017, 07/08/2018   Influenza-Unspecified 09/30/2008, 10/25/2009, 07/26/2010, 08/15/2011, 08/28/2013, 07/25/2015, 08/14/2016, 08/21/2021   Pneumococcal Conjugate-13 08/14/2016   Pneumococcal Polysaccharide-23 08/02/2011   Td (Adult),5 Lf Tetanus Toxid, Preservative Free 06/15/2006    Past Medical History:  Diagnosis Date   Arthritis    Cancer (HCC)     prostrate     radiation   COPD (chronic obstructive pulmonary disease) (HCC)    Esophageal diverticulum    s/p esophageal resection   Essential hypertension    HOCM (hypertrophic obstructive cardiomyopathy) (HCC)    Not congenital, likely 2/2 uncontrolled HBP   Holosystolic murmur    06/28/18 echo shows LVOT stenosis consistent w/ HOCM and likely 2/2 uncontrolled HBP     Tobacco History: Social History   Tobacco Use  Smoking Status Never  Smokeless Tobacco Never   Counseling given: Not Answered   Continue to not smoke  Outpatient Encounter Medications as of 06/13/2023  Medication Sig   albuterol (VENTOLIN HFA) 108 (90 Base) MCG/ACT inhaler Inhale 2 puffs into the lungs every 6 (six) hours as needed for shortness of breath.   apixaban (ELIQUIS) 5 MG TABS tablet Take 1 tablet (5 mg total) by mouth 2 (two) times daily.   Fluticasone-Umeclidin-Vilant (TRELEGY ELLIPTA) 200-62.5-25 MCG/ACT AEPB Inhale 1 puff into the lungs daily.   gabapentin (NEURONTIN) 100 MG capsule Take 100 mg by mouth 3 (three) times daily.   Levothyroxine Sodium 100 MCG CAPS Take 100 mcg by mouth daily before breakfast.   pantoprazole (PROTONIX) 40 MG tablet  Take 1 tablet (40 mg total) by mouth daily.   tamsulosin (FLOMAX) 0.4 MG CAPS capsule Take 0.8 mg by mouth daily.   azithromycin (ZITHROMAX) 250 MG tablet Take 2 tablets (500 mg total) by mouth daily for 1 day, THEN 1 tablet (250 mg total) daily for 4 days.   predniSONE (DELTASONE) 20 MG tablet Take 1 tablet (20 mg total) by mouth daily with breakfast for 5 days.   No facility-administered encounter medications on file as of 06/13/2023.  Review of Systems  Review of Systems  N/a  BP 136/84 (BP Location: Right Arm, Patient Position: Sitting, Cuff Size: Normal)   Pulse 72   Temp 97.8 F (36.6 C) (Oral)   Ht 5\' 8"  (1.727 m)   Wt 182 lb (82.6 kg)   SpO2 98%   BMI 27.67 kg/m   Wt Readings from Last 5 Encounters:  06/13/23 182 lb (82.6 kg)  02/16/23 187 lb (84.8 kg)  10/02/22 187 lb 9.6 oz (85.1 kg)  08/18/22 188 lb (85.3 kg)  07/28/22 192 lb (87.1 kg)    BMI Readings from Last 5 Encounters:  06/13/23 27.67 kg/m  02/16/23 26.83 kg/m  10/02/22 26.92 kg/m  08/18/22 26.98 kg/m  07/28/22 27.55 kg/m     Physical Exam General: Well-appearing, no acute distress Eyes: EOMI, no icterus Neck: Supple, no JVP Pulmonary: Clear, normal work of breathing, prolonged expiratory phase Cardiovascular: Regular rate and rhythm, no murmur MSK: No synovitis, joint effusion Neuro: Normal gait, no weakness Psych: Normal mood, full affect   Assessment & Plan:   Dyspnea on exertion: High suspicion for reactive airways disease given air trapping on PFTs.  Acute PE 11/2021 contributing.  Improving with treatment of PE.  Trelegy helped some.  To continue for now.  Asthma with acute exacerbation: Hyperinflation on chest imaging with air trapping on PFTs.  With worsening symptoms on Breo.  Escalated to Trelegy given uncontrolled symptoms on ICS/LABA.  Has had some improvement in symptoms.  Some mild increase in dyspnea and congestion over the last visit.  Chest exam is clear.  Samples of  Trelegy today, to continue.  Based on last time I prescribed concern he is not using this or does not have access to this, cost is an issue.  Prednisone 20 mg for 5 days and Z-Pak prescribed today.  Pulmonary embolus: 11/2021.  No clear inciting event, unprovoked.  Continue anticoagulation.  Samples of Eliquis provided today.  Return in about 3 months (around 09/13/2023).   Karren Burly, MD 06/13/2023

## 2023-06-13 NOTE — Patient Instructions (Addendum)
Nice to see you again  Take azithromycin 2 tablets on day 1 then 1 tablet on days 2 through 5  Take prednisone 20 mg once a day for the next 5 days  This is to help improve your breathing and congestion/cough  Continue Trelegy - samples today  Return to clinic in 3 months or sooner if needed

## 2023-08-23 ENCOUNTER — Ambulatory Visit (HOSPITAL_BASED_OUTPATIENT_CLINIC_OR_DEPARTMENT_OTHER): Payer: Medicare Other | Admitting: Cardiology

## 2023-08-30 ENCOUNTER — Ambulatory Visit (HOSPITAL_BASED_OUTPATIENT_CLINIC_OR_DEPARTMENT_OTHER): Payer: Medicare Other | Admitting: Cardiology

## 2023-08-30 ENCOUNTER — Encounter (HOSPITAL_BASED_OUTPATIENT_CLINIC_OR_DEPARTMENT_OTHER): Payer: Self-pay | Admitting: Cardiology

## 2023-08-30 VITALS — BP 138/70 | HR 75 | Ht 69.0 in | Wt 177.8 lb

## 2023-08-30 DIAGNOSIS — I440 Atrioventricular block, first degree: Secondary | ICD-10-CM

## 2023-08-30 DIAGNOSIS — I421 Obstructive hypertrophic cardiomyopathy: Secondary | ICD-10-CM

## 2023-08-30 DIAGNOSIS — Z86711 Personal history of pulmonary embolism: Secondary | ICD-10-CM

## 2023-08-30 DIAGNOSIS — I452 Bifascicular block: Secondary | ICD-10-CM | POA: Diagnosis not present

## 2023-08-30 MED ORDER — APIXABAN 5 MG PO TABS: 5.0000 mg | ORAL_TABLET | Freq: Two times a day (BID) | ORAL | 0 refills | Status: DC

## 2023-08-30 NOTE — Patient Instructions (Signed)
Medication Instructions:  Your physician recommends that you continue on your current medications as directed. Please refer to the Current Medication list given to you today.  *If you need a refill on your cardiac medications before your next appointment, please call your pharmacy*  Follow-Up: At [facility], you and your health needs are our priority.  As part of our continuing mission to provide you with exceptional heart care, we have created designated Provider Care Teams.  These Care Teams include your primary Cardiologist (physician) and Advanced Practice Providers (APPs -  Physician Assistants and Nurse Practitioners) who all work together to provide you with the care you need, when you need it.  We recommend signing up for the patient portal called "MyChart".  Sign up information is provided on this After Visit Summary.  MyChart is used to connect with patients for Virtual Visits (Telemedicine).  Patients are able to view lab/test results, encounter notes, upcoming appointments, etc.  Non-urgent messages can be sent to your provider as well.   To learn more about what you can do with MyChart, go to [email or web address].    Your next appointment:   6 month(s)  Provider:   Bridgette Christopher, MD    

## 2023-08-30 NOTE — Progress Notes (Signed)
Cardiology Office Note:  .   Date:  08/30/2023  ID:  Dan Freeman, DOB 09/21/1931, MRN [id number] PCP: Diamantina Providence, FNP  [facility] Providers Cardiologist:  Jodelle Red, MD {  History of Present Illness: .   Dan Freeman is a 89 y.o. male with a hx of hypertension, COPD, prostate cancer s/p radiation, s/p esophagectomy 2/2 diverticulum, hypothyroidism who is seen for follow up today. I initially met him during his hospitalization 9/12-9/16/19 for shortness of breath (in the context of recent pneumonia, on inhalers/antibiotics/steroids) and mildly elevated/flat troponin. He did have near syncope with a coughing spell, thought to be 2/2 dynamic outflow obstruction from HOCM.    Had hospitalization in 11/2021 where he was found to have saddle pulmonary embolism with right heart strain on CT. He was tolerating Apixaban well with no significant bleeding issues and his breathing was improving at that time. He reports that bending/squatting worsened his shortness of breath.  Today: Recently returned from a 9 day conference in [place], since returning home has felt tired, had a mild cough. No fevers/chills, no issues with sleeping. He thinks he wore himself out.  Feels short of breath when he bends over to tie his shoes, but not when he is walking.   Chronic diffuse joint pain, has known back/neck issues. Has also had pelvis and shoulder fractures in recent history.  ROS: Denies chest pain, shortness of breath at rest or with normal exertion. No PND, orthopnea, LE edema or unexpected weight gain. No syncope or palpitations. ROS otherwise negative except as noted.   Studies Reviewed: Marland Kitchen    EKG:       Physical Exam:   VS:  BP 138/70   Pulse 75   Ht 5\' 9"  (1.753 m)   Wt 177 lb 12.8 oz (80.6 kg)   SpO2 97%   BMI 26.26 kg/m    Wt Readings from Last 3 Encounters:  08/30/23 177 lb 12.8 oz (80.6 kg)  06/13/23 182 lb (82.6 kg)  02/16/23 187 lb (84.8 kg)    GEN: Well  nourished, well developed in no acute distress HEENT: Normal, moist mucous membranes NECK: No JVD CARDIAC: regular rhythm with occasional dropped beat, normal S1 and S2, no rubs or gallops. 1/6 systolic murmur. VASCULAR: Radial and DP pulses 2+ bilaterally. No carotid bruits RESPIRATORY:  Clear to auscultation without rales, wheezing or rhonchi  ABDOMEN: Soft, non-tender, non-distended MUSCULOSKELETAL:  Ambulates independently SKIN: Warm and dry, no edema NEUROLOGIC:  Alert and oriented x 3. No focal neuro deficits noted. PSYCHIATRIC:  Normal affect    ASSESSMENT AND PLAN: .    Shortness of breath: -never normalized after PE. Had right heart strain on initial CT (but not on echo) -repeat echo reassuring -also has COPD, followed by pulmonology -euvolemic on exam today   HOCM with LVOT obstruction -no longer on metoprolol given bradycardia. Conduction disease stable -counseled on avoiding dehydration -previously discussed, no plans for ICD -previously discussed that this can be genetic, recommendations for first degree family member screening   Prior saddle pulmonary embolism -no right heart strain on echo (concern for it on CT) -doing well on anticoagulation. It is unclear to me what the long term plan was for his anticoagulation. His event was 11/2021. He is not sure if this was to be temporary or long term. Additionally, he notes he is on apixaban now but prior notes comment that he was switched to Xarelto. I recommended he clarify with his PCP was the  plan is for this.   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 [place], [place] [address] [phone]

## 2023-09-04 ENCOUNTER — Encounter: Payer: Self-pay | Admitting: Pulmonary Disease

## 2023-09-04 ENCOUNTER — Ambulatory Visit: Payer: Medicare Other | Admitting: Pulmonary Disease

## 2024-06-24 ENCOUNTER — Ambulatory Visit (INDEPENDENT_AMBULATORY_CARE_PROVIDER_SITE_OTHER): Admitting: Pulmonary Disease

## 2024-06-24 VITALS — BP 142/66 | HR 96 | Ht 71.0 in | Wt 180.0 lb

## 2024-06-24 DIAGNOSIS — J441 Chronic obstructive pulmonary disease with (acute) exacerbation: Secondary | ICD-10-CM

## 2024-06-24 MED ORDER — PREDNISONE 20 MG PO TABS: 20.0000 mg | ORAL_TABLET | Freq: Every day | ORAL | 0 refills | Status: AC

## 2024-06-24 MED ORDER — AZITHROMYCIN 250 MG PO TABS: ORAL_TABLET | ORAL | 0 refills | Status: AC

## 2024-06-24 NOTE — Patient Instructions (Signed)
 I will call in a course of antibiotics and steroids for you - Azithromycin  for 5 days and prednisone  for 7 days  Continue using your Trelegy on a daily basis  Continue rescue inhaler use as needed  Call us  with significant concerns  Call us  if nonresolution of your symptoms  Follow-up in about 3 months

## 2024-06-24 NOTE — Progress Notes (Signed)
 Dan Freeman    [id number]    1930/12/04  Primary Care Physician:Anderson, Nell SAILOR, FNP  Referring Physician: Lenon Nell SAILOR, FNP [address] [place],  [place] [address]  Chief complaint:   Patient being seen for acute symptoms For few months has had increased cough and sputum production  HPI:  Underlying history of asthma, obstructive lung disease  Increased cough and sputum production  No fevers, no chills No chest pains or chest discomfort  No weight loss  No past history of significant smoking  Did construction work in the past  Compliant with Trelegy on a daily basis and use of rescue inhaler as needed  Activity level is way down according to him, energy levels way down  Outpatient Encounter Medications as of 06/24/2024  Medication Sig   albuterol  (VENTOLIN  HFA) 108 (90 Base) MCG/ACT inhaler Inhale 2 puffs into the lungs every 6 (six) hours as needed for shortness of breath.   apixaban  (ELIQUIS ) 5 MG TABS tablet Take 1 tablet (5 mg total) by mouth 2 (two) times daily.   apixaban  (ELIQUIS ) 5 MG TABS tablet Take 1 tablet (5 mg total) by mouth 2 (two) times daily.   Fluticasone -Umeclidin-Vilant (TRELEGY ELLIPTA ) 200-62.5-25 MCG/ACT AEPB Inhale 1 puff into the lungs daily.   gabapentin  (NEURONTIN ) 100 MG capsule Take 100 mg by mouth 3 (three) times daily.   Levothyroxine  Sodium 100 MCG CAPS Take 100 mcg by mouth daily before breakfast.   pantoprazole  (PROTONIX ) 40 MG tablet Take 1 tablet (40 mg total) by mouth daily.   tamsulosin  (FLOMAX ) 0.4 MG CAPS capsule Take 0.8 mg by mouth daily.   Fluticasone -Umeclidin-Vilant (TRELEGY ELLIPTA ) 200-62.5-25 MCG/ACT AEPB Inhale 1 puff into the lungs daily. (Patient not taking: Reported on 06/24/2024)   No facility-administered encounter medications on file as of 06/24/2024.    Allergies as of 06/24/2024   (No Known Allergies)    Past Medical History:  Diagnosis Date   Arthritis    Cancer (HCC)      prostrate     radiation   COPD (chronic obstructive pulmonary disease) (HCC)    Esophageal diverticulum    s/p esophageal resection   Essential hypertension    HOCM (hypertrophic obstructive cardiomyopathy) (HCC)    Not congenital, likely 2/2 uncontrolled HBP   Holosystolic murmur    06/28/18 echo shows LVOT stenosis consistent w/ HOCM and likely 2/2 uncontrolled HBP     Past Surgical History:  Procedure Laterality Date   ESOPHAGECTOMY     SHOULDER ARTHROSCOPY  01/05/2012   Procedure: ARTHROSCOPY SHOULDER;  Surgeon: Rome JULIANNA Pepper, MD;  Location: [facility];  Service: Orthopedics;  Laterality: Right;  acromioplasty    Family History  Problem Relation Age of Onset   Diabetes Mellitus II Sister    Cancer Brother     Social History   Socioeconomic History   Marital status: Married    Spouse name: Not on file   Number of children: Not on file   Years of education: Not on file   Highest education level: Not on file  Occupational History   Occupation: [employer]  Tobacco Use   Smoking status: Never   Smokeless tobacco: Never  Vaping Use   Vaping status: Never Used  Substance and Sexual Activity   Alcohol use: No   Drug use: No   Sexual activity: Not on file  Other Topics Concern   Not on file  Social History Narrative  Not on file   Social Drivers of Health   Financial Resource Strain: Medium Risk (06/27/2018)   Overall Financial Resource Strain (CARDIA)    Difficulty of Paying Living Expenses: Somewhat hard  Food Insecurity: No Food Insecurity (06/27/2018)   Hunger Vital Sign    Worried About Running Out of Food in the Last Year: Never true    Ran Out of Food in the Last Year: Never true  Transportation Needs: No Transportation Needs (06/27/2018)   PRAPARE - Administrator, Civil Service (Medical): No    Lack of Transportation (Non-Medical): No  Physical Activity: Sufficiently Active (06/27/2018)   Exercise Vital Sign    Days of Exercise per Week: 7 days     Minutes of Exercise per Session: 80 min  Stress: No Stress Concern Present (06/27/2018)   [employer] of Occupational Health - Occupational Stress Questionnaire    Feeling of Stress : Not at all  Social Connections: Unknown (02/28/2022)   Received from [facility]   Social Network    Social Network: Not on file  Intimate Partner Violence: Unknown (01/20/2022)   Received from [facility]   HITS    Physically Hurt: Not on file    Insult or Talk Down To: Not on file    Threaten Physical Harm: Not on file    Scream or Curse: Not on file    Review of Systems  Respiratory:  Positive for cough, shortness of breath and wheezing.     Vitals:   06/24/24 1416  Pulse: 96  SpO2: 99%     Physical Exam Constitutional:      Appearance: He is well-developed.  HENT:     Head: Normocephalic.     Mouth/Throat:     Mouth: Mucous membranes are moist.  Eyes:     General: No scleral icterus.    Pupils: Pupils are equal, round, and reactive to light.  Cardiovascular:     Rate and Rhythm: Normal rate and regular rhythm.     Heart sounds: No murmur heard.    No friction rub.  Pulmonary:     Effort: No respiratory distress.     Breath sounds: No stridor. No wheezing or rhonchi.  Musculoskeletal:     Cervical back: Normal range of motion.  Neurological:     Mental Status: He is alert.  Psychiatric:        Mood and Affect: Mood normal.     Data Reviewed: PFT from 2023 reviewed  Last chest x-ray was in 2023-reviewed showing hyperinflation  Most recent records by Dr. Annella 18 May 2023 reviewed.  Assessment/Plan: Asthma/obstructive lung disease with acute exacerbation  Bronchitic exacerbation of asthma  Continue Trelegy  Continue albuterol  as needed  Course of azithromycin  call to pharmacy  Course of prednisone  called to pharmacy  Encouraged to give us  a call if symptoms do not continue to improve  Tentative follow-up in about 3 months    Jennet Epley  MD  Pulmonary and Critical Care 06/24/2024, 2:28 PM  CC: Lenon Nell SAILOR, FNP

## 2024-06-26 ENCOUNTER — Ambulatory Visit: Admitting: Pulmonary Disease

## 2024-09-24 ENCOUNTER — Ambulatory Visit: Admitting: Pulmonary Disease

## 2024-09-30 ENCOUNTER — Ambulatory Visit: Admitting: Pulmonary Disease

## 2024-10-21 ENCOUNTER — Encounter (HOSPITAL_COMMUNITY): Payer: Self-pay

## 2024-10-21 ENCOUNTER — Other Ambulatory Visit: Payer: Self-pay

## 2024-10-21 ENCOUNTER — Emergency Department (HOSPITAL_COMMUNITY)
Admission: EM | Admit: 2024-10-21 | Discharge: 2024-10-22 | Disposition: A | Discharge status: Home / Self Care | Attending: Emergency Medicine | Admitting: Emergency Medicine

## 2024-10-21 ENCOUNTER — Emergency Department (HOSPITAL_COMMUNITY)

## 2024-10-21 DIAGNOSIS — Z7901 Long term (current) use of anticoagulants: Secondary | ICD-10-CM | POA: Insufficient documentation

## 2024-10-21 DIAGNOSIS — J449 Chronic obstructive pulmonary disease, unspecified: Secondary | ICD-10-CM | POA: Insufficient documentation

## 2024-10-21 DIAGNOSIS — Z7982 Long term (current) use of aspirin: Secondary | ICD-10-CM | POA: Insufficient documentation

## 2024-10-21 DIAGNOSIS — R778 Other specified abnormalities of plasma proteins: Secondary | ICD-10-CM | POA: Insufficient documentation

## 2024-10-21 DIAGNOSIS — Z7951 Long term (current) use of inhaled steroids: Secondary | ICD-10-CM | POA: Diagnosis not present

## 2024-10-21 DIAGNOSIS — R7989 Other specified abnormal findings of blood chemistry: Secondary | ICD-10-CM

## 2024-10-21 DIAGNOSIS — I82402 Acute embolism and thrombosis of unspecified deep veins of left lower extremity: Secondary | ICD-10-CM | POA: Insufficient documentation

## 2024-10-21 DIAGNOSIS — M545 Low back pain, unspecified: Secondary | ICD-10-CM | POA: Diagnosis not present

## 2024-10-21 DIAGNOSIS — R0602 Shortness of breath: Secondary | ICD-10-CM | POA: Insufficient documentation

## 2024-10-21 DIAGNOSIS — M25551 Pain in right hip: Secondary | ICD-10-CM | POA: Diagnosis not present

## 2024-10-21 DIAGNOSIS — M79662 Pain in left lower leg: Secondary | ICD-10-CM | POA: Diagnosis not present

## 2024-10-21 DIAGNOSIS — Z79899 Other long term (current) drug therapy: Secondary | ICD-10-CM | POA: Insufficient documentation

## 2024-10-21 DIAGNOSIS — M7989 Other specified soft tissue disorders: Secondary | ICD-10-CM | POA: Diagnosis present

## 2024-10-21 LAB — CBC WITH DIFFERENTIAL/PLATELET
Abs Immature Granulocytes: 0.01 K/uL (ref 0.00–0.07)
Basophils Absolute: 0 K/uL (ref 0.0–0.1)
Basophils Relative: 0 %
Eosinophils Absolute: 0.2 K/uL (ref 0.0–0.5)
Eosinophils Relative: 3 %
HCT: 39.5 % (ref 39.0–52.0)
Hemoglobin: 12.5 g/dL — ABNORMAL LOW (ref 13.0–17.0)
Immature Granulocytes: 0 %
Lymphocytes Relative: 33 %
Lymphs Abs: 2.1 K/uL (ref 0.7–4.0)
MCH: 28.5 pg (ref 26.0–34.0)
MCHC: 31.6 g/dL (ref 30.0–36.0)
MCV: 90.2 fL (ref 80.0–100.0)
Monocytes Absolute: 0.6 K/uL (ref 0.1–1.0)
Monocytes Relative: 10 %
Neutro Abs: 3.3 K/uL (ref 1.7–7.7)
Neutrophils Relative %: 54 %
Platelets: 187 K/uL (ref 150–400)
RBC: 4.38 MIL/uL (ref 4.22–5.81)
RDW: 14.6 % (ref 11.5–15.5)
WBC: 6.2 K/uL (ref 4.0–10.5)
nRBC: 0 % (ref 0.0–0.2)

## 2024-10-21 LAB — I-STAT CHEM 8, ED
BUN: 21 mg/dL (ref 8–23)
Calcium, Ion: 1.21 mmol/L (ref 1.15–1.40)
Chloride: 106 mmol/L (ref 98–111)
Creatinine, Ser: 1.3 mg/dL — ABNORMAL HIGH (ref 0.61–1.24)
Glucose, Bld: 106 mg/dL — ABNORMAL HIGH (ref 70–99)
HCT: 39 % (ref 39.0–52.0)
Hemoglobin: 13.3 g/dL (ref 13.0–17.0)
Potassium: 4.4 mmol/L (ref 3.5–5.1)
Sodium: 141 mmol/L (ref 135–145)
TCO2: 22 mmol/L (ref 22–32)

## 2024-10-21 LAB — COMPREHENSIVE METABOLIC PANEL WITH GFR
ALT: 14 U/L (ref 0–44)
AST: 27 U/L (ref 15–41)
Albumin: 4.3 g/dL (ref 3.5–5.0)
Alkaline Phosphatase: 98 U/L (ref 38–126)
Anion gap: 12 (ref 5–15)
BUN: 19 mg/dL (ref 8–23)
CO2: 24 mmol/L (ref 22–32)
Calcium: 9.4 mg/dL (ref 8.9–10.3)
Chloride: 104 mmol/L (ref 98–111)
Creatinine, Ser: 1.26 mg/dL — ABNORMAL HIGH (ref 0.61–1.24)
GFR, Estimated: 54 mL/min — ABNORMAL LOW
Glucose, Bld: 106 mg/dL — ABNORMAL HIGH (ref 70–99)
Potassium: 4.9 mmol/L (ref 3.5–5.1)
Sodium: 141 mmol/L (ref 135–145)
Total Bilirubin: 0.4 mg/dL (ref 0.0–1.2)
Total Protein: 7.3 g/dL (ref 6.5–8.1)

## 2024-10-21 LAB — PROTIME-INR
INR: 1 (ref 0.8–1.2)
Prothrombin Time: 13.4 s (ref 11.4–15.2)

## 2024-10-21 LAB — PRO BRAIN NATRIURETIC PEPTIDE: Pro Brain Natriuretic Peptide: 680 pg/mL — ABNORMAL HIGH

## 2024-10-21 LAB — TROPONIN T, HIGH SENSITIVITY: Troponin T High Sensitivity: 47 ng/L — ABNORMAL HIGH (ref 0–19)

## 2024-10-21 MED ORDER — IOHEXOL 350 MG/ML SOLN
75.0000 mL | Freq: Once | INTRAVENOUS | Status: AC | PRN
  Administered 2024-10-21: 75 mL via INTRAVENOUS

## 2024-10-21 NOTE — ED Provider Triage Note (Signed)
 Emergency Medicine Provider Triage Evaluation Note  Dan Freeman , a 89 y.o. male  was evaluated in triage.  Pt complains of leg swelling and shortness of breath.  States went to orthopedic doctor earlier today for right hip pain.  Noted his left leg was swollen and doctor was concerned for a blood clot.  Advise he should come to the ED for evaluation.  Also notes shortness of breath has been increasing for the past few days.  States he has had a PE previously.  He does take a daily aspirin  but has been out of his Eliquis  for about a month.  Notes of chest pain as well.  Denies headache, dizziness, syncope  Review of Systems  Positive: See above Negative: Hematemesis, abdominal pain, nausea/vomiting  Physical Exam  BP (!) 112/98   Pulse 90   Temp 97.6 F (36.4 C) (Oral)   Resp 16   SpO2 96%  Gen:   Awake, no distress   Resp:  Tachypnea MSK:   Moves extremities without difficulty  Other:  2+ nonpitting edema noted to the left lower extremity compared to the right.  Medical Decision Making  Medically screening exam initiated at 10:10 PM.  Appropriate orders placed.  Alejandra MALVA Aid was informed that the remainder of the evaluation will be completed by another provider, this initial triage assessment does not replace that evaluation, and the importance of remaining in the ED until their evaluation is complete.     Neysa Thersia RAMAN, [place] 10/21/24 2211

## 2024-10-21 NOTE — ED Triage Notes (Signed)
 Pt sent by orthopedist for evaluation of poss DVT in L leg; hx PE; pt take baby asa daily, has been off eliquis  for several weeks since running out; endorses swelling to L leg and low back pain, sob and R hip pain; denies cp  Pt gives verbal consent for mse

## 2024-10-22 ENCOUNTER — Emergency Department (EMERGENCY_DEPARTMENT_HOSPITAL): Admit: 2024-10-22 | Discharge: 2024-10-22 | Disposition: A | Discharge status: Home / Self Care

## 2024-10-22 ENCOUNTER — Other Ambulatory Visit (HOSPITAL_COMMUNITY): Payer: Self-pay

## 2024-10-22 DIAGNOSIS — M79662 Pain in left lower leg: Secondary | ICD-10-CM | POA: Diagnosis not present

## 2024-10-22 LAB — TROPONIN T, HIGH SENSITIVITY
Troponin T High Sensitivity: 49 ng/L — ABNORMAL HIGH (ref 0–19)
Troponin T High Sensitivity: 50 ng/L — ABNORMAL HIGH (ref 0–19)
Troponin T High Sensitivity: 53 ng/L — ABNORMAL HIGH (ref 0–19)

## 2024-10-22 MED ORDER — APIXABAN 5 MG PO TABS: 5.0000 mg | ORAL_TABLET | Freq: Two times a day (BID) | ORAL | Status: DC

## 2024-10-22 MED ORDER — ACETAMINOPHEN 500 MG PO TABS
1000.0000 mg | ORAL_TABLET | Freq: Once | ORAL | Status: AC
  Administered 2024-10-22: 1000 mg via ORAL
  Filled 2024-10-22: qty 2

## 2024-10-22 MED ORDER — APIXABAN 5 MG PO TABS
10.0000 mg | ORAL_TABLET | Freq: Two times a day (BID) | ORAL | Status: DC
  Administered 2024-10-22: 10 mg via ORAL
  Filled 2024-10-22: qty 2

## 2024-10-22 MED ORDER — APIXABAN 5 MG PO TABS
5.0000 mg | ORAL_TABLET | Freq: Two times a day (BID) | ORAL | 0 refills | Status: AC
  Filled 2024-10-22: qty 46, 23d supply, fill #0

## 2024-10-22 MED ORDER — APIXABAN 5 MG PO TABS: 10.0000 mg | ORAL_TABLET | Freq: Two times a day (BID) | ORAL | Status: DC

## 2024-10-22 MED ORDER — APIXABAN 5 MG PO TABS
10.0000 mg | ORAL_TABLET | Freq: Two times a day (BID) | ORAL | 0 refills | Status: AC
  Filled 2024-10-22: qty 28, 7d supply, fill #0
  Filled 2024-10-22: qty 74, 30d supply, fill #0

## 2024-10-22 NOTE — ED Notes (Signed)
 Called vascular to bring patient to room when finished

## 2024-10-22 NOTE — Progress Notes (Signed)
 Left lower extremity venous duplex has been completed  Results can be found in chart review under CV Proc. and realyed to PA Young.  10/22/2024 11:29 AM  Samira Acero Elden Appl, RVT.

## 2024-10-22 NOTE — Discharge Instructions (Signed)
 You have been evaluated for your symptoms.  Your workup is notable for new DVT involving your left lower extremity.  Please take Eliquis  as prescribed and follow-up closely with your doctor for further management of your condition.  Return to ER if you have any other concern.

## 2024-10-22 NOTE — ED Provider Notes (Signed)
 "  EMERGENCY DEPARTMENT AT [facility] Provider Note   CSN: [id number] Arrival date & time: 10/21/24  1758     Patient presents with: Leg Swelling   Dan Freeman is a 89 y.o. male.   The history is provided by the patient, the spouse and medical records. No language interpreter was used.     89 year old male with history of COPD, PE/DVT who was taking Eliquis  but currently only taking baby aspirin , sent here by orthopedist for evaluation of potential blood clot.  History obtained through patient and through wife at bedside.  For the past 1 week wife has noticed progressive worsening swelling involving patient's left leg.  He endorsed occasional pain in the leg but denies any injury to it.  He did fell 3 weeks ago and landed on his right hip for which he is being evaluated by his orthopedic specialist.  He went to his orthopedist yesterday for his leg swelling and was recommended to come to the ER to be evaluated for potential blood clot.  He has remote history of blood clot.  Patient does not endorse any fever chills runny nose sneezing or coughing.  He endorsed occasional shortness of breath with exertion.  He did recall 1 episode of chest pain yesterday lasting for about 30 seconds.  He described pain as a sharp stabbing sensation to his left chest radiates towards his left side of neck and up to his head but it was transient.  It was nonexertional.  He denies any significant cardiac illness.  Prior to Admission medications  Medication Sig Start Date End Date Taking? Authorizing Provider  albuterol  (VENTOLIN  HFA) 108 (90 Base) MCG/ACT inhaler Inhale 2 puffs into the lungs every 6 (six) hours as needed for shortness of breath. 11/24/21   Hunsucker, Donnice SAUNDERS, MD  apixaban  (ELIQUIS ) 5 MG TABS tablet Take 1 tablet (5 mg total) by mouth 2 (two) times daily. 12/05/21   Singh, Prashant K, MD  apixaban  (ELIQUIS ) 5 MG TABS tablet Take 1 tablet (5 mg total) by mouth 2 (two) times  daily. 08/30/23   Lonni Slain, MD  azithromycin  (ZITHROMAX  Z-PAK) 250 MG tablet Take 2 tablets day 1 and then 1 daily for 4 days 06/24/24   Neda Jennet LABOR, MD  Fluticasone -Umeclidin-Vilant (TRELEGY ELLIPTA ) 200-62.5-25 MCG/ACT AEPB Inhale 1 puff into the lungs daily. 06/13/23   Hunsucker, Donnice SAUNDERS, MD  Fluticasone -Umeclidin-Vilant (TRELEGY ELLIPTA ) 200-62.5-25 MCG/ACT AEPB Inhale 1 puff into the lungs daily. Patient not taking: Reported on 06/24/2024 06/13/23   Hunsucker, Donnice SAUNDERS, MD  gabapentin  (NEURONTIN ) 100 MG capsule Take 100 mg by mouth 3 (three) times daily. 10/13/21   [provider]  Levothyroxine  Sodium 100 MCG CAPS Take 100 mcg by mouth daily before breakfast. 04/30/17   [provider]  pantoprazole  (PROTONIX ) 40 MG tablet Take 1 tablet (40 mg total) by mouth daily. 11/29/21   Singh, Prashant K, MD  predniSONE  (DELTASONE ) 20 MG tablet Take 1 tablet (20 mg total) by mouth daily with breakfast. 06/24/24   Neda Jennet A, MD  tamsulosin  (FLOMAX ) 0.4 MG CAPS capsule Take 0.8 mg by mouth daily. 09/06/21   [provider]    Allergies: Patient has no known allergies.    Review of Systems  All other systems reviewed and are negative.   Updated Vital Signs BP 131/76 (BP Location: Right Arm)   Pulse 75   Temp 97.8 F (36.6 C)   Resp 18   SpO2 100%   Physical  Exam Constitutional:      General: He is not in acute distress.    Appearance: He is well-developed.  HENT:     Head: Atraumatic.  Eyes:     Conjunctiva/sclera: Conjunctivae normal.  Cardiovascular:     Rate and Rhythm: Normal rate and regular rhythm.     Pulses: Normal pulses.     Heart sounds: Normal heart sounds.  Pulmonary:     Effort: Pulmonary effort is normal.     Breath sounds: Normal breath sounds. No wheezing, rhonchi or rales.  Abdominal:     Palpations: Abdomen is soft.     Tenderness: There is no abdominal tenderness.  Musculoskeletal:     Cervical back: Normal  range of motion and neck supple.     Left lower leg: Edema (Left lower extremity with 2+ pitting edema throughout the leg no erythema or warmth noted.  Intact distal pedal pulse.) present.  Skin:    Findings: No rash.  Neurological:     Mental Status: He is alert.     (all labs ordered are listed, but only abnormal results are displayed) Labs Reviewed  CBC WITH DIFFERENTIAL/PLATELET - Abnormal; Notable for the following components:      Result Value   Hemoglobin 12.5 (*)    All other components within normal limits  COMPREHENSIVE METABOLIC PANEL WITH GFR - Abnormal; Notable for the following components:   Glucose, Bld 106 (*)    Creatinine, Ser 1.26 (*)    GFR, Estimated 54 (*)    All other components within normal limits  PRO BRAIN NATRIURETIC PEPTIDE - Abnormal; Notable for the following components:   Pro Brain Natriuretic Peptide 680.0 (*)    All other components within normal limits  I-STAT CHEM 8, ED - Abnormal; Notable for the following components:   Creatinine, Ser 1.30 (*)    Glucose, Bld 106 (*)    All other components within normal limits  TROPONIN T, HIGH SENSITIVITY - Abnormal; Notable for the following components:   Troponin T High Sensitivity 47 (*)    All other components within normal limits  TROPONIN T, HIGH SENSITIVITY - Abnormal; Notable for the following components:   Troponin T High Sensitivity 53 (*)    All other components within normal limits  TROPONIN T, HIGH SENSITIVITY - Abnormal; Notable for the following components:   Troponin T High Sensitivity 49 (*)    All other components within normal limits  PROTIME-INR  TROPONIN T, HIGH SENSITIVITY    EKG: EKG Interpretation Date/Time:  Tuesday October 21 2024 22:49:08 EST Ventricular Rate:  90 PR Interval:  246 QRS Duration:  128 QT Interval:  396 QTC Calculation: 484 R Axis:   267  Text Interpretation: Sinus rhythm with 1st degree A-V block with Premature atrial complexes Right bundle branch  block Left ventricular hypertrophy with repolarization abnormality ( R in aVL ) Inferior infarct , age undetermined Anterolateral infarct , age undetermined Abnormal ECG When compared with ECG of 25-Nov-2021 14:27, PREVIOUS ECG IS PRESENT Confirmed by Pamella Sharper [phone]) on 10/22/2024 9:39:10 AM  Radiology: VAS US  LOWER EXTREMITY VENOUS (DVT) (ONLY [facility] & [facility]) Result Date: 10/22/2024  Lower Venous DVT Study Patient Name:  Dan Freeman  Date of Exam:   10/22/2024 Medical Rec #: [id number]      Accession #:    [id number] Date of Birth: [birth year]      Patient Gender: M Patient Age:   57 years Exam Location:  [facility] Procedure:  VAS US  LOWER EXTREMITY VENOUS (DVT) Referring Phys: ALEXIS YOUNG --------------------------------------------------------------------------------  Indications: Pain, and rule out DVT.  Comparison Study: Previous study of the bilateral lower extremities on                   2.11.2023. Performing Technologist: Edilia Elden Appl  Examination Guidelines: A complete evaluation includes B-mode imaging, spectral Doppler, color Doppler, and power Doppler as needed of all accessible portions of each vessel. Bilateral testing is considered an integral part of a complete examination. Limited examinations for reoccurring indications may be performed as noted. The reflux portion of the exam is performed with the patient in reverse Trendelenburg.  +-----+---------------+---------+-----------+----------+--------------+ RIGHTCompressibilityPhasicitySpontaneityPropertiesThrombus Aging +-----+---------------+---------+-----------+----------+--------------+ CFV  Partial        Yes      Yes                  Chronic        +-----+---------------+---------+-----------+----------+--------------+ SFJ  Full           Yes      Yes                                 +-----+---------------+---------+-----------+----------+--------------+ Chronic thrombus noted in the right common femoral  vein.  +---------+---------------+---------+-----------+----------+-------------------+ LEFT     CompressibilityPhasicitySpontaneityPropertiesThrombus Aging      +---------+---------------+---------+-----------+----------+-------------------+ CFV      None           No       No                                       +---------+---------------+---------+-----------+----------+-------------------+ SFJ      None           No       No                                       +---------+---------------+---------+-----------+----------+-------------------+ FV Prox  None           No       No                                       +---------+---------------+---------+-----------+----------+-------------------+ FV Mid   None           No       No                                       +---------+---------------+---------+-----------+----------+-------------------+ FV DistalNone           No       No                                       +---------+---------------+---------+-----------+----------+-------------------+ PFV      None           No       No                                       +---------+---------------+---------+-----------+----------+-------------------+  POP      None           No       No                                       +---------+---------------+---------+-----------+----------+-------------------+ PTV      None           No       No                                       +---------+---------------+---------+-----------+----------+-------------------+ PERO                                                  Not well visualized +---------+---------------+---------+-----------+----------+-------------------+ EIV                     No       No                                       +---------+---------------+---------+-----------+----------+-------------------+ CIV                     Yes      Yes                                       +---------+---------------+---------+-----------+----------+-------------------+ Deep vein thrombosis noted in left external iliac vein, common femoral vein, the great saphenous vein at the saphenofemoral junction, the femoral vein, the popliteal, and the posterior tibial veins. The peroneal veins were not well visualized during compression and showed no signal with color for doppler location. IVC not visualized during this exam.    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  LEFT: - Findings consistent with acute deep vein thrombosis involving the left common femoral vein, SF junction, left femoral vein, left proximal profunda vein, left popliteal vein, and left posterior tibial veins.  - No cystic structure found in the popliteal fossa. - Thrombus noted in the external iliac vein. Peroneal veins were not visualized.  *See table(s) above for measurements and observations.    Preliminary    CT Angio Chest PE W and/or Wo Contrast Result Date: 10/21/2024 EXAM: CTA of the Chest with contrast for PE 10/21/2024 11:00:00 PM TECHNIQUE: CTA of the chest was performed without and with the administration of 75 mL of iohexol  (OMNIPAQUE ) 350 MG/ML injection. Multiplanar reformatted images are provided for review. MIP images are provided for review. Automated exposure control, iterative reconstruction, and/or weight based adjustment of the mA/kV was utilized to reduce the radiation dose to as low as reasonably achievable. COMPARISON: 11/25/2021. Aortic atherosclerosis. Calcified granuloma in the left upper lobe. Calcified granuloma in the right lower lobe. Scarring in the lung bases. CLINICAL HISTORY: Pulmonary embolism (PE) suspected, high prob. FINDINGS: PULMONARY ARTERIES: Pulmonary arteries are adequately opacified for evaluation. No pulmonary embolism. Main pulmonary artery is normal in caliber. MEDIASTINUM: The heart and pericardium demonstrate  no  acute abnormality. Aortic atherosclerosis. There is no acute abnormality of the thoracic aorta. LYMPH NODES: No mediastinal, hilar or axillary lymphadenopathy. LUNGS AND PLEURA: Calcified granuloma in the left upper lobe and right lower lobe. Scarring in the lung bases. No focal consolidation or pulmonary edema. No pleural effusion or pneumothorax. UPPER ABDOMEN: Limited images of the upper abdomen are unremarkable. SOFT TISSUES AND BONES: No acute bone or soft tissue abnormality. IMPRESSION: 1. No pulmonary embolism. 2. No acute cardiopulmonary disease. 3. Aortic atherosclerosis. Electronically signed by: Franky Crease MD 10/21/2024 11:12 PM EST RP Workstation: [id number]     .Critical Care  Performed by: Nivia Colon, PA-C Authorized by: Nivia Colon, PA-C   Critical care provider statement:    Critical care time (minutes):  30   Critical care was time spent personally by me on the following activities:  Development of treatment plan with patient or surrogate, discussions with consultants, evaluation of patient's response to treatment, examination of patient, ordering and review of laboratory studies, ordering and review of radiographic studies, ordering and performing treatments and interventions, pulse oximetry, re-evaluation of patient's condition and review of old charts    Medications Ordered in the ED  apixaban  (ELIQUIS ) tablet 10 mg (10 mg Oral Given 10/22/24 1240)    Followed by  apixaban  (ELIQUIS ) tablet 5 mg (has no administration in time range)  iohexol  (OMNIPAQUE ) 350 MG/ML injection 75 mL (75 mLs Intravenous Contrast Given 10/21/24 2300)  acetaminophen  (TYLENOL ) tablet 1,000 mg (1,000 mg Oral Given 10/22/24 1130)                                    Medical Decision Making Risk OTC drugs. Prescription drug management.   BP 131/76 (BP Location: Right Arm)   Pulse 75   Temp 97.8 F (36.6 C)   Resp 18   SpO2 100%   95:12 AM 89 year old male with history of COPD, PE/DVT who was  taking Eliquis  but currently only taking baby aspirin , sent here by orthopedist for evaluation of potential blood clot.  History obtained through patient and through wife at bedside.  For the past 1 week wife has noticed progressive worsening swelling involving patient's left leg.  He endorsed occasional pain in the leg but denies any injury to it.  He did fell 3 weeks ago and landed on his right hip for which he is being evaluated by his orthopedic specialist.  He went to his orthopedist yesterday for his leg swelling and was recommended to come to the ER to be evaluated for potential blood clot.  He has remote history of blood clot.  Patient does not endorse any fever chills runny nose sneezing or coughing.  He endorsed occasional shortness of breath with exertion.  He did recall 1 episode of chest pain yesterday lasting for about 30 seconds.  He described pain as a sharp stabbing sensation to his left chest radiates towards his left side of neck and up to his head but it was transient.  It was nonexertional.  He denies any significant cardiac illness.  Exam notable for peripheral edema involving the left lower extremity when compared to right.  Otherwise patient is resting comfortably in no acute discomfort.  Heart with normal rate and rhythm, lungs are clear abdomen soft nontender.  -Labs ordered, independently viewed and interpreted by me.  Labs remarkable for troponin is mildly elevated at 47, and 53 respectively.  Creatinine is  1.26.  proBNP is 680. -The patient was maintained on a cardiac monitor.  I personally viewed and interpreted the cardiac monitored which showed an underlying rhythm of: Sinus rhythm -Imaging independently viewed and interpreted by me and I agree with radiologist's interpretation.  Result remarkable for chest CT angiogram showed no evidence of PE.  Vascular ultrasound of lower extremity currently pending -This patient presents to the ED for concern of leg swelling, this involves  an extensive number of treatment options, and is a complaint that carries with it a high risk of complications and morbidity.  The differential diagnosis includes DVT, peripheral edema, CHF, cellulitis, elephantitis, anasarca -Co morbidities that complicate the patient evaluation includes PE/DVT, COPD -Treatment includes eliquis  -Reevaluation of the patient after these medicines showed that the patient improved -PCP office notes or outside notes reviewed -Discussion with attending Dr. Pamella -Escalation to admission/observation considered: patients feels much better, is comfortable with discharge, and will follow up with PCP -Prescription medication considered, patient comfortable with eliquis  -Social Determinant of Health considered  Third troponin is flat at 49.  Since patient does not have any chest pain and no evidence of PE, I felt patient is stable to go home with a starter pack of Eliquis  due to finding of acute DVT involving the left common femoral vein on downward.  I discussed this with attending Dr. Pamella  Encourage close follow-up with PCP for outpatient management.  Return precaution given.      Final diagnoses:  Leg DVT (deep venous thromboembolism), acute, left (HCC)  Elevated troponin    ED Discharge Orders          Ordered    APIXABAN  (ELIQUIS ) VTE STARTER PACK (10MG  AND 5MG )       Note to Pharmacy: If starter pack unavailable, substitute with seventy-four 5 mg apixaban  tabs following the above SIG directions.   10/22/24 1236               Nivia Colon, PA-C 10/22/24 1313    Pamella Ozell LABOR, DO 10/25/24 [phone]  "

## 2024-11-03 ENCOUNTER — Ambulatory Visit: Payer: Self-pay | Admitting: Pulmonary Disease

## 2024-11-03 ENCOUNTER — Encounter: Payer: Self-pay | Admitting: Pulmonary Disease

## 2024-11-03 ENCOUNTER — Ambulatory Visit: Admitting: Pulmonary Disease

## 2024-11-03 VITALS — BP 146/78 | HR 94 | Temp 97.4°F | Ht 70.0 in | Wt 177.0 lb

## 2024-11-03 DIAGNOSIS — J441 Chronic obstructive pulmonary disease with (acute) exacerbation: Secondary | ICD-10-CM | POA: Diagnosis not present

## 2024-11-03 DIAGNOSIS — J4489 Other specified chronic obstructive pulmonary disease: Secondary | ICD-10-CM

## 2024-11-03 MED ORDER — AZITHROMYCIN 250 MG PO TABS: ORAL_TABLET | ORAL | 0 refills | Status: AC

## 2024-11-03 MED ORDER — PREDNISONE 20 MG PO TABS: 20.0000 mg | ORAL_TABLET | Freq: Every day | ORAL | 0 refills | Status: AC

## 2024-11-03 MED ORDER — GUAIFENESIN-DM 100-10 MG/5ML PO SYRP: 5.0000 mL | ORAL_SOLUTION | ORAL | 0 refills | Status: DC | PRN

## 2024-11-03 NOTE — Telephone Encounter (Signed)
 FYI Only or Action Required?: FYI only for provider: appointment scheduled on 11/03/24.  Patient is followed in Pulmonology for COPD, last seen on 06/24/2024 by Neda Jennet LABOR, MD.  Called Nurse Triage reporting Cough/wheezing.  Symptoms began a week ago.  Interventions attempted: OTC medications: Robitussin and Rescue inhaler.  Symptoms are: gradually worsening.  Triage Disposition: See Physician Within 24 Hours  Patient/caregiver understands and will follow disposition?: yes      Message from [facility] sent at 11/03/2024 12:31 PM EST  Reason for Triage: Shortness of breath, coughing, choking on phlem.   Reason for Disposition  [1] Known COPD or other severe lung disease (i.e., bronchiectasis, cystic fibrosis, lung surgery) AND [2] symptoms getting worse (i.e., increased sputum purulence or amount, increased breathing difficulty  Answer Assessment - Initial Assessment Questions 1. ONSET: When did the cough begin?      After ED visit evaluated in office 10/27/24 2. SEVERITY: How bad is the cough today?      Preventing from sleep/coughing  3. SPUTUM: Describe the color of your sputum (e.g., none, dry cough; clear, white, yellow, green)     White to greyish  green/thick stated makes him choke  4. HEMOPTYSIS: Are you coughing up any blood? If Yes, ask: How much? (e.g., flecks, streaks, tablespoons, etc.)     no 5. DIFFICULTY BREATHING: Are you having difficulty breathing? If Yes, ask: How bad is it? (e.g., mild, moderate, severe)      None - just during coughing spells 6. FEVER: Do you have a fever? If Yes, ask: What is your temperature, how was it measured, and when did it start?     No- gets hot in middle of night after coughing  7. CARDIAC HISTORY: Do you have any history of heart disease? (e.g., heart attack, congestive heart failure)      HTN  8. LUNG HISTORY: Do you have any history of lung disease?  (e.g., pulmonary embolus, asthma, emphysema)      COPD 9. PE RISK FACTORS: Do you have a history of blood clots? (or: recent major surgery, recent prolonged travel, bedridden)     Yes left femur DVT 10/21/24 10. OTHER SYMPTOMS: Do you have any other symptoms? (e.g., runny nose, wheezing, chest pain)       Cough, gets SOB with coughing episodes  Protocols used: Cough - Acute Productive-A-AH

## 2024-11-03 NOTE — Progress Notes (Signed)
 "              Dan Freeman    [id number]    [birth year]  Primary Care Physician:Anderson, Nell SAILOR, FNP  Referring Physician: Lenon Nell SAILOR, FNP [address] [place],  [place] [address]  Chief complaint:   Patient being seen for an acute visit Has been coughing, shortness of breath, feeling tight  HPI:  History of obstructive lung disease, asthma  Increased cough, shortness of breath, chest tightness No fevers or chills No weight loss Appetite is maintained  No significant past history of smoking  Did construction work in the past  Current compliance with his usual Trelegy  Was recently treated for a blood clot and remains on anticoagulation  Activity level is way down according to him, energy levels way down  Outpatient Encounter Medications as of 11/03/2024  Medication Sig   albuterol  (VENTOLIN  HFA) 108 (90 Base) MCG/ACT inhaler Inhale 2 puffs into the lungs every 6 (six) hours as needed for shortness of breath.   apixaban  (ELIQUIS ) 5 MG TABS tablet Take 2 tablets (10 mg total) by mouth 2 (two) times daily for 7 days.   apixaban  (ELIQUIS ) 5 MG TABS tablet On day 8, switch to take 1 tablet (5 mg total) by mouth 2 (two) times daily.   azithromycin  (ZITHROMAX  Z-PAK) 250 MG tablet Take 2 tablets day 1 and then 1 daily for 4 days   Fluticasone -Umeclidin-Vilant (TRELEGY ELLIPTA ) 200-62.5-25 MCG/ACT AEPB Inhale 1 puff into the lungs daily.   gabapentin  (NEURONTIN ) 100 MG capsule Take 100 mg by mouth 3 (three) times daily.   guaiFENesin -dextromethorphan (ROBITUSSIN DM) 100-10 MG/5ML syrup Take 5 mLs by mouth every 4 (four) hours as needed for cough.   Levothyroxine  Sodium 100 MCG CAPS Take 100 mcg by mouth daily before breakfast.   pantoprazole  (PROTONIX ) 40 MG tablet Take 1 tablet (40 mg total) by mouth daily.   predniSONE  (DELTASONE ) 20 MG tablet Take 1 tablet (20 mg total) by mouth daily with breakfast.   tamsulosin  (FLOMAX ) 0.4 MG CAPS capsule Take 0.8 mg by mouth  daily.   azithromycin  (ZITHROMAX  Z-PAK) 250 MG tablet Take 2 tablets day 1 and then 1 daily for 4 days (Patient not taking: Reported on 11/03/2024)   Fluticasone -Umeclidin-Vilant (TRELEGY ELLIPTA ) 200-62.5-25 MCG/ACT AEPB Inhale 1 puff into the lungs daily. (Patient not taking: Reported on 06/24/2024)   predniSONE  (DELTASONE ) 20 MG tablet Take 1 tablet (20 mg total) by mouth daily with breakfast. (Patient not taking: Reported on 11/03/2024)   No facility-administered encounter medications on file as of 11/03/2024.    Allergies as of 11/03/2024   (No Known Allergies)    Past Medical History:  Diagnosis Date   Arthritis    Cancer (HCC)     prostrate     radiation   COPD (chronic obstructive pulmonary disease) (HCC)    Esophageal diverticulum    s/p esophageal resection   Essential hypertension    HOCM (hypertrophic obstructive cardiomyopathy) (HCC)    Not congenital, likely 2/2 uncontrolled HBP   Holosystolic murmur    06/28/18 echo shows LVOT stenosis consistent w/ HOCM and likely 2/2 uncontrolled HBP     Past Surgical History:  Procedure Laterality Date   ESOPHAGECTOMY     SHOULDER ARTHROSCOPY  01/05/2012   Procedure: ARTHROSCOPY SHOULDER;  Surgeon: Rome JULIANNA Pepper, MD;  Location: [facility];  Service: Orthopedics;  Laterality: Right;  acromioplasty    Family History  Problem Relation Age of Onset  Diabetes Mellitus II Sister    Cancer Brother     Social History   Socioeconomic History   Marital status: Married    Spouse name: Not on file   Number of children: Not on file   Years of education: Not on file   Highest education level: Not on file  Occupational History   Occupation: [employer]  Tobacco Use   Smoking status: Never   Smokeless tobacco: Never  Vaping Use   Vaping status: Never Used  Substance and Sexual Activity   Alcohol use: No   Drug use: No   Sexual activity: Not on file  Other Topics Concern   Not on file  Social History Narrative   Not on file   Social  Drivers of Health   Tobacco Use: Low Risk (11/03/2024)   Patient History    Smoking Tobacco Use: Never    Smokeless Tobacco Use: Never    Passive Exposure: Not on file  Recent Concern: Tobacco Use - Medium Risk (08/30/2024)   Received from [facility]   Patient History    Smoking Tobacco Use: Former    Smokeless Tobacco Use: Never    Passive Exposure: Not on Actuary Strain: Not on file  Food Insecurity: No Food Insecurity (10/22/2024)   Epic    Worried About Programme Researcher, Broadcasting/film/video in the Last Year: Never true    Ran Out of Food in the Last Year: Never true  Transportation Needs: No Transportation Needs (10/22/2024)   Epic    Lack of Transportation (Medical): No    Lack of Transportation (Non-Medical): No  Physical Activity: Not on file  Stress: Not on file  Social Connections: Socially Integrated (10/22/2024)   Social Connection and Isolation Panel    Frequency of Communication with Friends and Family: More than three times a week    Frequency of Social Gatherings with Friends and Family: More than three times a week    Attends Religious Services: More than 4 times per year    Active Member of [employer] or Organizations: Yes    Attends Banker Meetings: More than 4 times per year    Marital Status: Married  Catering Manager Violence: Unknown (01/20/2022)   Received from [facility]   HITS    Physically Hurt: Not on file    Insult or Talk Down To: Not on file    Threaten Physical Harm: Not on file    Scream or Curse: Not on file  Depression (EYV7-0): Not on file  Alcohol Screen: Not on file  Housing: Low Risk (10/22/2024)   Epic    Unable to Pay for Housing in the Last Year: No    Number of Times Moved in the Last Year: 0    Homeless in the Last Year: No  Utilities: Not At Risk (10/22/2024)   Epic    Threatened with loss of utilities: No  Health Literacy: Not on file    Review of Systems  Respiratory:  Positive for cough, shortness of breath and  wheezing.     Vitals:   11/03/24 1533  BP: (!) 146/78  Pulse: 94  Temp: (!) 97.4 F (36.3 C)  SpO2: 92%     Physical Exam Constitutional:      Appearance: He is well-developed.  HENT:     Head: Normocephalic.     Mouth/Throat:     Mouth: Mucous membranes are moist.  Eyes:     General: No scleral icterus.  Pupils: Pupils are equal, round, and reactive to light.  Cardiovascular:     Rate and Rhythm: Normal rate and regular rhythm.     Heart sounds: No murmur heard.    No friction rub.  Pulmonary:     Effort: No respiratory distress.     Breath sounds: No stridor. No wheezing or rhonchi.  Musculoskeletal:     Cervical back: Normal range of motion.  Neurological:     Mental Status: He is alert.  Psychiatric:        Mood and Affect: Mood normal.     Data Reviewed: PFT from 2023 reviewed  Last chest x-ray was in 2023-reviewed showing hyperinflation  Most recent visit with Dr. Annella was reviewed  Assessment/Plan: Asthma/COPD Acute exacerbation of obstructive lung disease  Encouraged to continue Trelegy  I will call in a course of antibiotics-azithromycin   Will call in a course of prednisone  20 mg daily for 7 days  Called in a prescription for Robitussin DM  Encouraged to give us  a call with any significant concerns  Follow-up in about 6 weeks   Jennet Epley MD [facility] 11/03/2024, 3:48 PM  CC: Lenon Nell SAILOR, FNP   "

## 2024-11-03 NOTE — Patient Instructions (Signed)
 We will see you back in about 6 to 8 weeks  I will call in a prescription for antibiotics-azithromycin  Will call in a course of steroids-prednisone  Will call in cough medicine  Call us  with significant concerns

## 2024-11-06 ENCOUNTER — Other Ambulatory Visit: Payer: Self-pay

## 2024-11-06 MED ORDER — GUAIFENESIN-DM 100-10 MG/5ML PO SYRP: 5.0000 mL | ORAL_SOLUTION | ORAL | 0 refills | Status: AC | PRN

## 2024-12-16 ENCOUNTER — Ambulatory Visit: Admitting: Pulmonary Disease
